# Patient Record
Sex: Female | Born: 1955
Health system: Southern US, Community
[De-identification: ages and names within clinical notes are randomized; demographics above are authoritative.]

## PROBLEM LIST (undated history)

## (undated) DIAGNOSIS — I10 Essential (primary) hypertension: Secondary | ICD-10-CM

## (undated) DIAGNOSIS — F411 Generalized anxiety disorder: Secondary | ICD-10-CM

## (undated) DIAGNOSIS — R7303 Prediabetes: Secondary | ICD-10-CM

## (undated) DIAGNOSIS — F329 Major depressive disorder, single episode, unspecified: Secondary | ICD-10-CM

## (undated) DIAGNOSIS — J302 Other seasonal allergic rhinitis: Secondary | ICD-10-CM

## (undated) DIAGNOSIS — F32A Depression, unspecified: Secondary | ICD-10-CM

## (undated) DIAGNOSIS — E785 Hyperlipidemia, unspecified: Secondary | ICD-10-CM

## (undated) DIAGNOSIS — F1010 Alcohol abuse, uncomplicated: Secondary | ICD-10-CM

## (undated) HISTORY — DX: Major depressive disorder, single episode, unspecified: F32.9

## (undated) HISTORY — DX: Essential (primary) hypertension: I10

## (undated) HISTORY — DX: Prediabetes: R73.03

## (undated) HISTORY — DX: Depression, unspecified: F32.A

## (undated) HISTORY — DX: Other seasonal allergic rhinitis: J30.2

## (undated) HISTORY — DX: Alcohol abuse, uncomplicated: F10.10

## (undated) HISTORY — DX: Generalized anxiety disorder: F41.1

## (undated) HISTORY — DX: Hyperlipidemia, unspecified: E78.5

---

## 1996-08-16 HISTORY — PX: ABDOMINAL HYSTERECTOMY: SHX81

## 2004-09-03 ENCOUNTER — Encounter: Admission: RE | Admit: 2004-09-03 | Discharge: 2004-09-03 | Payer: Self-pay | Admitting: Internal Medicine

## 2005-01-19 ENCOUNTER — Encounter: Admission: RE | Admit: 2005-01-19 | Discharge: 2005-01-19 | Payer: Self-pay | Admitting: Family Medicine

## 2005-11-16 ENCOUNTER — Encounter: Admission: RE | Admit: 2005-11-16 | Discharge: 2005-11-16 | Payer: Self-pay | Admitting: Family Medicine

## 2007-11-14 ENCOUNTER — Encounter: Admission: RE | Admit: 2007-11-14 | Discharge: 2007-11-14 | Payer: Self-pay | Admitting: Family Medicine

## 2008-12-09 ENCOUNTER — Encounter: Admission: RE | Admit: 2008-12-09 | Discharge: 2008-12-09 | Payer: Self-pay | Admitting: Family Medicine

## 2009-05-13 ENCOUNTER — Encounter (INDEPENDENT_AMBULATORY_CARE_PROVIDER_SITE_OTHER): Payer: Self-pay

## 2010-05-20 ENCOUNTER — Encounter: Admission: RE | Admit: 2010-05-20 | Discharge: 2010-05-20 | Payer: Self-pay | Admitting: Family Medicine

## 2010-09-06 ENCOUNTER — Encounter: Payer: Self-pay | Admitting: Family Medicine

## 2010-09-15 NOTE — Letter (Signed)
Summary: Pre Visit No Show Letter  [facility]  [address] [place], [place] [address]   Phone: [phone]  Fax: [phone]        May 13, 2009 MRN: [id number]    [facility] [address] [place], [place]  [address]    Dear Ms. Margraf,   We have been unable to reach you by phone concerning the pre-procedure visit that you missed on 05-13-2009. For this reason,your procedure scheduled on_10-03-2009 has been cancelled. Our scheduling staff will gladly assist you with rescheduling your appointments at a more convenient time. Please call our office at [phone] between the hours of 8:00am and 5:00pm, press option #2 to reach an appointment scheduler. Please consider updating your contact numbers at this time so that we can reach you by phone in the future with schedule changes or results.    Thank you,    Ulis Rias RN [facility]

## 2011-05-13 ENCOUNTER — Other Ambulatory Visit: Payer: Self-pay | Admitting: Family Medicine

## 2011-05-13 DIAGNOSIS — Z1231 Encounter for screening mammogram for malignant neoplasm of breast: Secondary | ICD-10-CM

## 2011-05-28 ENCOUNTER — Ambulatory Visit: Payer: Self-pay

## 2011-06-04 ENCOUNTER — Ambulatory Visit: Payer: Self-pay

## 2011-11-22 ENCOUNTER — Ambulatory Visit: Payer: Self-pay

## 2012-08-01 ENCOUNTER — Other Ambulatory Visit: Payer: Self-pay | Admitting: Family Medicine

## 2012-08-01 DIAGNOSIS — Z1231 Encounter for screening mammogram for malignant neoplasm of breast: Secondary | ICD-10-CM

## 2012-09-11 ENCOUNTER — Ambulatory Visit
Admission: RE | Admit: 2012-09-11 | Discharge: 2012-09-11 | Disposition: A | Discharge status: Home / Self Care | Payer: BC Managed Care – PPO | Source: Ambulatory Visit | Attending: Family Medicine | Admitting: Family Medicine

## 2012-09-11 DIAGNOSIS — Z1231 Encounter for screening mammogram for malignant neoplasm of breast: Secondary | ICD-10-CM

## 2013-10-29 ENCOUNTER — Other Ambulatory Visit: Payer: Self-pay

## 2013-10-29 DIAGNOSIS — Z1231 Encounter for screening mammogram for malignant neoplasm of breast: Secondary | ICD-10-CM

## 2013-12-06 ENCOUNTER — Ambulatory Visit
Admission: RE | Admit: 2013-12-06 | Discharge: 2013-12-06 | Disposition: A | Discharge status: Home / Self Care | Payer: BC Managed Care – PPO | Source: Ambulatory Visit

## 2013-12-06 DIAGNOSIS — Z1231 Encounter for screening mammogram for malignant neoplasm of breast: Secondary | ICD-10-CM

## 2013-12-10 ENCOUNTER — Other Ambulatory Visit: Payer: Self-pay | Admitting: Family Medicine

## 2013-12-10 DIAGNOSIS — R928 Other abnormal and inconclusive findings on diagnostic imaging of breast: Secondary | ICD-10-CM

## 2013-12-21 ENCOUNTER — Ambulatory Visit
Admission: RE | Admit: 2013-12-21 | Discharge: 2013-12-21 | Disposition: A | Discharge status: Home / Self Care | Payer: BC Managed Care – PPO | Source: Ambulatory Visit | Attending: Family Medicine | Admitting: Family Medicine

## 2013-12-21 ENCOUNTER — Other Ambulatory Visit: Payer: BC Managed Care – PPO

## 2013-12-21 DIAGNOSIS — R928 Other abnormal and inconclusive findings on diagnostic imaging of breast: Secondary | ICD-10-CM

## 2015-01-06 ENCOUNTER — Other Ambulatory Visit: Payer: Self-pay

## 2015-01-06 DIAGNOSIS — Z1231 Encounter for screening mammogram for malignant neoplasm of breast: Secondary | ICD-10-CM

## 2015-01-10 ENCOUNTER — Other Ambulatory Visit: Payer: Self-pay | Admitting: Family Medicine

## 2015-01-10 ENCOUNTER — Other Ambulatory Visit: Payer: Self-pay

## 2015-01-10 DIAGNOSIS — N6310 Unspecified lump in the right breast, unspecified quadrant: Secondary | ICD-10-CM

## 2015-01-14 ENCOUNTER — Ambulatory Visit: Payer: BC Managed Care – PPO

## 2015-01-16 ENCOUNTER — Other Ambulatory Visit: Payer: BC Managed Care – PPO

## 2015-01-21 ENCOUNTER — Ambulatory Visit
Admission: RE | Admit: 2015-01-21 | Discharge: 2015-01-21 | Disposition: A | Discharge status: Home / Self Care | Payer: BC Managed Care – PPO | Source: Ambulatory Visit | Attending: Family Medicine | Admitting: Family Medicine

## 2015-01-21 DIAGNOSIS — N6310 Unspecified lump in the right breast, unspecified quadrant: Secondary | ICD-10-CM

## 2016-01-28 ENCOUNTER — Other Ambulatory Visit: Payer: Self-pay | Admitting: Family Medicine

## 2016-01-28 DIAGNOSIS — Z1231 Encounter for screening mammogram for malignant neoplasm of breast: Secondary | ICD-10-CM

## 2016-02-06 ENCOUNTER — Ambulatory Visit
Admission: RE | Admit: 2016-02-06 | Discharge: 2016-02-06 | Disposition: A | Discharge status: Home / Self Care | Payer: BC Managed Care – PPO | Source: Ambulatory Visit | Attending: Family Medicine | Admitting: Family Medicine

## 2016-02-06 DIAGNOSIS — Z1231 Encounter for screening mammogram for malignant neoplasm of breast: Secondary | ICD-10-CM

## 2017-05-23 ENCOUNTER — Other Ambulatory Visit: Payer: Self-pay | Admitting: Family Medicine

## 2017-05-23 DIAGNOSIS — Z1231 Encounter for screening mammogram for malignant neoplasm of breast: Secondary | ICD-10-CM

## 2017-06-02 ENCOUNTER — Ambulatory Visit: Payer: BC Managed Care – PPO

## 2017-10-10 ENCOUNTER — Other Ambulatory Visit: Payer: Self-pay | Admitting: Family Medicine

## 2017-10-10 DIAGNOSIS — Z1231 Encounter for screening mammogram for malignant neoplasm of breast: Secondary | ICD-10-CM

## 2017-10-28 ENCOUNTER — Ambulatory Visit: Payer: BC Managed Care – PPO

## 2018-01-26 ENCOUNTER — Ambulatory Visit: Payer: BC Managed Care – PPO | Admitting: Adult Health

## 2018-03-01 ENCOUNTER — Ambulatory Visit: Payer: BC Managed Care – PPO | Admitting: Adult Health

## 2018-04-18 ENCOUNTER — Encounter: Payer: Self-pay | Admitting: Adult Health

## 2018-04-18 ENCOUNTER — Ambulatory Visit: Payer: BC Managed Care – PPO | Admitting: Adult Health

## 2018-04-18 VITALS — BP 156/100 | Temp 98.6°F | Wt 160.0 lb

## 2018-04-18 DIAGNOSIS — I10 Essential (primary) hypertension: Secondary | ICD-10-CM

## 2018-04-18 DIAGNOSIS — F329 Major depressive disorder, single episode, unspecified: Secondary | ICD-10-CM | POA: Insufficient documentation

## 2018-04-18 DIAGNOSIS — F3289 Other specified depressive episodes: Secondary | ICD-10-CM | POA: Diagnosis not present

## 2018-04-18 DIAGNOSIS — Z7689 Persons encountering health services in other specified circumstances: Secondary | ICD-10-CM | POA: Diagnosis not present

## 2018-04-18 DIAGNOSIS — E782 Mixed hyperlipidemia: Secondary | ICD-10-CM | POA: Diagnosis not present

## 2018-04-18 DIAGNOSIS — E785 Hyperlipidemia, unspecified: Secondary | ICD-10-CM | POA: Insufficient documentation

## 2018-04-18 DIAGNOSIS — F32A Depression, unspecified: Secondary | ICD-10-CM | POA: Insufficient documentation

## 2018-04-18 DIAGNOSIS — F411 Generalized anxiety disorder: Secondary | ICD-10-CM | POA: Insufficient documentation

## 2018-04-18 DIAGNOSIS — F1010 Alcohol abuse, uncomplicated: Secondary | ICD-10-CM

## 2018-04-18 NOTE — Patient Instructions (Addendum)
It was great meeting you today   Please sign up for Mychart and send me a list of all your medications and dosages.   We will request your records from [facility].   Please follow up with me in 1-2 months for your physical.

## 2018-04-18 NOTE — Progress Notes (Signed)
Patient presents to clinic today to establish care. She is a pleasant 62 year old female who  has a past medical history of Depression and Hypertension.   She was seeing primary care at [facility].   Her last CPE was " about a year ago"   Acute Concerns: Establish Care   Chronic Issues: Depression/Anxiety - Zoloft, Visteral PRN. Reports that she feels as though her medications are working well for her. Was prescribed them while she was in rehab.   Insomnia - Trazodone but unsure of dosage  Alcohol Abuse - Recently completed detox program in [place] for alcohol abuse. She is going to [facility].   Hypertension - Takes blood pressure medication, unsure of what she is taking.  BP Readings from Last 3 Encounters:  04/18/18 (!) 156/100   Hyperlipidemia - Takes statin, unsure which statin she is taking.   Seasonal Allergies - takes OTC   Health Maintenance: Dental -- Routine Care  Vision -- Routine Care - wears glasses.  Immunizations -- " up to date" Colonoscopy -- 2015 - believes she goes back every 10 years.  Mammogram -- " within in the last year" PAP -- No longer needed Bone Density -- Unknown  Diet: She has started to eat healthy.  Exercise: Works out at [employer] a week   Past Medical History:  Diagnosis Date  . Depression   . Hypertension       Current Outpatient Medications on File Prior to Visit  Medication Sig Dispense Refill  . benztropine (COGENTIN) 0.5 MG tablet     . sertraline (ZOLOFT) 50 MG tablet Take 50 mg by mouth daily.  0  . traZODone (DESYREL) 100 MG tablet      No current facility-administered medications on file prior to visit.     No Known Allergies  Family History  Problem Relation Age of Onset  . High Cholesterol Mother   . High blood pressure Mother   . Diabetes Mother   . High blood pressure Father   . High Cholesterol Father   . Diabetes Father   . High blood pressure Brother   . High Cholesterol Brother     Social History    Socioeconomic History  . Marital status: Single    Spouse name: Not on file  . Number of children: Not on file  . Years of education: Not on file  . Highest education level: Not on file  Occupational History  . Not on file  Social Needs  . Financial resource strain: Not on file  . Food insecurity:    Worry: Not on file    Inability: Not on file  . Transportation needs:    Medical: Not on file    Non-medical: Not on file  Tobacco Use  . Smoking status: Former Research scientist (life sciences)  . Smokeless tobacco: Never Used  Substance and Sexual Activity  . Alcohol use: Not Currently  . Drug use: Never  . Sexual activity: Not on file  Lifestyle  . Physical activity:    Days per week: Not on file    Minutes per session: Not on file  . Stress: Not on file  Relationships  . Social connections:    Talks on phone: Not on file    Gets together: Not on file    Attends religious service: Not on file    Active member of club or organization: Not on file    Attends meetings of clubs or organizations: Not on file  Relationship status: Not on file  . Intimate partner violence:    Fear of current or ex partner: Not on file    Emotionally abused: Not on file    Physically abused: Not on file    Forced sexual activity: Not on file  Other Topics Concern  . 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   HENT: Negative.   Respiratory: Negative.   Cardiovascular: Negative.   Gastrointestinal: Negative.   Genitourinary: Negative.   Musculoskeletal: Negative.   Skin: Negative.   Neurological: Negative.   Psychiatric/Behavioral: Positive for depression. The patient is nervous/anxious and has insomnia.   All other systems reviewed and are negative.       BP (!) 156/100   Temp 98.6 F (37 C) (Oral)   Wt 160 lb (72.6 kg)   Physical Exam  Constitutional: She is oriented to  person, place, and time. She appears well-developed and well-nourished. No distress.  Neck: Normal range of motion.  Cardiovascular: Normal rate, regular rhythm and intact distal pulses. Exam reveals no gallop and no friction rub.  No murmur heard. Pulmonary/Chest: Effort normal and breath sounds normal. No stridor. No respiratory distress. She has no wheezes. She has no rales. She exhibits no tenderness.  Abdominal: Soft. Bowel sounds are normal. She exhibits no distension and no mass. There is no tenderness. There is no rebound and no guarding. No hernia.  Neurological: She is alert and oriented to person, place, and time.  Skin: Skin is warm and dry. She is not diaphoretic.  Psychiatric: She has a normal mood and affect. Her behavior is normal. Judgment and thought content normal.  Nursing note and vitals reviewed.   Assessment/Plan: 1. Encounter to establish care - will request records from [facility].  - Follow up for CPE or sooner if needed - She will send me a list of all her medications via Mychart.   2. Essential hypertension - Not at goal. Will likely need to add medications   3. Mixed hyperlipidemia - Will wait for medication list   4. Other depression - Will wait for medication list   5. Alcohol abuse - Will wait for medication list    Dorothyann Peng, NP

## 2018-04-19 ENCOUNTER — Encounter: Payer: Self-pay | Admitting: Adult Health

## 2018-04-20 ENCOUNTER — Other Ambulatory Visit: Payer: Self-pay | Admitting: Adult Health

## 2018-04-20 MED ORDER — HYDROXYZINE PAMOATE 50 MG PO CAPS: 50.0000 mg | ORAL_CAPSULE | Freq: Three times a day (TID) | ORAL | 0 refills | Status: DC

## 2018-04-20 MED ORDER — HYDROCHLOROTHIAZIDE 25 MG PO TABS: 25.0000 mg | ORAL_TABLET | Freq: Every day | ORAL | 0 refills | Status: DC

## 2018-04-20 MED ORDER — ROSUVASTATIN CALCIUM 40 MG PO CPSP: 40.0000 mg | ORAL_CAPSULE | Freq: Every day | ORAL | 0 refills | Status: DC

## 2018-04-20 MED ORDER — BENZTROPINE MESYLATE 0.5 MG PO TABS: 0.5000 mg | ORAL_TABLET | Freq: Every day | ORAL | 0 refills | Status: DC

## 2018-04-20 MED ORDER — DISULFIRAM 500 MG PO TABS: 1.0000 | ORAL_TABLET | Freq: Every day | ORAL | 0 refills | Status: DC

## 2018-04-20 MED ORDER — QUINAPRIL HCL 20 MG PO TABS: 20.0000 mg | ORAL_TABLET | Freq: Every day | ORAL | 0 refills | Status: DC

## 2018-04-20 MED ORDER — SERTRALINE HCL 50 MG PO TABS: 50.0000 mg | ORAL_TABLET | Freq: Every day | ORAL | 0 refills | Status: DC

## 2018-04-20 MED ORDER — TRAZODONE HCL 100 MG PO TABS: 100.0000 mg | ORAL_TABLET | Freq: Every day | ORAL | 0 refills | Status: DC

## 2018-05-09 ENCOUNTER — Other Ambulatory Visit: Payer: Self-pay | Admitting: Adult Health

## 2018-05-09 MED ORDER — HYDROXYZINE PAMOATE 50 MG PO CAPS: 50.0000 mg | ORAL_CAPSULE | Freq: Three times a day (TID) | ORAL | 0 refills | Status: DC

## 2018-05-17 ENCOUNTER — Encounter: Payer: Self-pay | Admitting: Adult Health

## 2018-05-17 ENCOUNTER — Encounter: Payer: BC Managed Care – PPO | Admitting: Adult Health

## 2018-05-17 DIAGNOSIS — Z0289 Encounter for other administrative examinations: Secondary | ICD-10-CM

## 2018-05-17 NOTE — Progress Notes (Deleted)
Subjective:    Patient ID: Ellen [name], female    DOB: 1956-08-14, 62 y.o.   MRN: [id number]  HPI Patient presents for yearly preventative medicine examination. She is a pleasant 62 year old female who  has a past medical history of Alcohol abuse, Depression, Hyperlipidemia, Hypertension, and Seasonal allergies.  Essential hypertension-takes Accupril 20 mg tablets and hydrochlorothiazide 25 mg tablets  Hyperlipidemia -takes Crestor 40 mg capsules  Depression/Anxiety -controlled with Zoloft daily and Vistaril as needed  Insomnia -takes trazodone  Alcohol Abuse -recently completed rehab program in [place].  She is going to [facility] in [place]. She is also using Antabuse.  All immunizations and health maintenance protocols were reviewed with the patient and needed orders were placed.  Appropriate screening laboratory values were ordered for the patient including screening of hyperlipidemia, renal function and hepatic function.  Medication reconciliation,  past medical history, social history, problem list and allergies were reviewed in detail with the patient  Goals were established with regard to weight loss, exercise, and  diet in compliance with medications. She has been working on diet and is going to [employer] three times a week.  BP Readings from Last 3 Encounters:  04/18/18 (!) 156/100   End of life planning was discussed.  She reports having a colonoscopy within the last 5 years believe that it was normal(not have these records).  She also reports being up-to-date on her mammogram.  She participates in routine dental and vision screens  Review of Systems  Constitutional: Negative.   HENT: Negative.   Eyes: Negative.   Respiratory: Negative.   Cardiovascular: Negative.   Gastrointestinal: Negative.   Endocrine: Negative.   Genitourinary: Negative.   Musculoskeletal: Negative.   Skin: Negative.   Allergic/Immunologic: Negative.   Neurological: Negative.     Hematological: Negative.   Psychiatric/Behavioral: Negative.    Past Medical History:  Diagnosis Date  . Alcohol abuse   . Depression   . Hyperlipidemia   . Hypertension   . Seasonal allergies     Social History   Socioeconomic History  . Marital status: Married    Spouse name: Not on file  . Number of children: Not on file  . Years of education: Not on file  . Highest education level: Not on file  Occupational History  . Not on file  Social Needs  . Financial resource strain: Not on file  . Food insecurity:    Worry: Not on file    Inability: Not on file  . Transportation needs:    Medical: Not on file    Non-medical: Not on file  Tobacco Use  . Smoking status: Former Research scientist (life sciences)  . Smokeless tobacco: Never Used  Substance and Sexual Activity  . Alcohol use: Not Currently  . Drug use: Never  . Sexual activity: Not on file  Lifestyle  . Physical activity:    Days per week: Not on file    Minutes per session: Not on file  . Stress: Not on file  Relationships  . Social connections:    Talks on phone: Not on file    Gets together: Not on file    Attends religious service: Not on file    Active member of club or organization: Not on file    Attends meetings of clubs or organizations: Not on file    Relationship status: Not on file  . Intimate partner violence:    Fear of current or ex partner: Not on file  Emotionally abused: Not on file    Physically abused: Not on file    Forced sexual activity: Not on file  Other Topics Concern  . 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 Please review them in the "History" navigator section and refresh this Bradley.  Family History  Problem Relation Age of Onset  . High Cholesterol Mother   . High blood pressure Mother   . Diabetes Mother   . High blood pressure Father   . High Cholesterol Father    . Diabetes Father   . High blood pressure Brother   . High Cholesterol Brother     No Known Allergies  Current Outpatient Medications on File Prior to Visit  Medication Sig Dispense Refill  . benztropine (COGENTIN) 0.5 MG tablet Take 1 tablet (0.5 mg total) by mouth daily. 90 tablet 0  . Disulfiram 500 MG TABS Take 1 tablet (500 mg total) by mouth daily. 30 each 0  . hydrochlorothiazide (HYDRODIURIL) 25 MG tablet Take 1 tablet (25 mg total) by mouth daily. 90 tablet 0  . hydrOXYzine (VISTARIL) 50 MG capsule Take 1 capsule (50 mg total) by mouth 3 (three) times daily. 30 capsule 0  . quinapril (ACCUPRIL) 20 MG tablet Take 1 tablet (20 mg total) by mouth daily. 90 tablet 0  . Rosuvastatin Calcium 40 MG CPSP Take 40 mg by mouth daily. 90 capsule 0  . sertraline (ZOLOFT) 50 MG tablet Take 1 tablet (50 mg total) by mouth daily. 90 tablet 0  . traZODone (DESYREL) 100 MG tablet Take 1 tablet (100 mg total) by mouth at bedtime. 90 tablet 0   No current facility-administered medications on file prior to visit.     There were no vitals taken for this visit.      Objective:   Physical Exam  Constitutional: She is oriented to person, place, and time. She appears well-developed and well-nourished. No distress.  HENT:  Head: Normocephalic and atraumatic.  Right Ear: External ear normal.  Left Ear: External ear normal.  Nose: Nose normal.  Mouth/Throat: Oropharynx is clear and moist. No oropharyngeal exudate.  Eyes: Pupils are equal, round, and reactive to light. Conjunctivae and EOM are normal. Right eye exhibits no discharge. Left eye exhibits no discharge. No scleral icterus.  Neck: Normal range of motion. Neck supple. No JVD present. No tracheal deviation present. No thyromegaly present.  Cardiovascular: Normal rate, regular rhythm, normal heart sounds and intact distal pulses. Exam reveals no gallop and no friction rub.  No murmur heard. Pulmonary/Chest: Effort normal and breath sounds  normal. No respiratory distress. She has no wheezes. She has no rales. She exhibits no tenderness.  Abdominal: Soft. Bowel sounds are normal. She exhibits no distension and no mass. There is no tenderness. There is no rebound and no guarding. No hernia.  Musculoskeletal: Normal range of motion. She exhibits no edema, tenderness or deformity.  Lymphadenopathy:    She has no cervical adenopathy.  Neurological: She is alert and oriented to person, place, and time. She displays normal reflexes. No cranial nerve deficit or sensory deficit. She exhibits normal muscle tone. Coordination normal.  Skin: Skin is warm and dry. Capillary refill takes less than 2 seconds. No rash noted. She is not diaphoretic. No erythema. No pallor.  Psychiatric: She has a normal mood and  affect. Her behavior is normal. Judgment and thought content normal.  Nursing note and vitals reviewed.     Assessment & Plan:

## 2018-06-15 ENCOUNTER — Other Ambulatory Visit: Payer: Self-pay | Admitting: Adult Health

## 2018-06-16 MED ORDER — DISULFIRAM 500 MG PO TABS: 1.0000 | ORAL_TABLET | Freq: Every day | ORAL | 1 refills | Status: AC

## 2018-06-16 MED ORDER — HYDROXYZINE PAMOATE 50 MG PO CAPS: 50.0000 mg | ORAL_CAPSULE | Freq: Three times a day (TID) | ORAL | 1 refills | Status: DC

## 2018-06-16 NOTE — Telephone Encounter (Signed)
Benztropine, trazodone, sertraline, quinapril, hctz and rosuvastatin were all filled on 04/20/18 for 90 days.  Will send remaining to [facility] for authorization.

## 2018-06-20 NOTE — Telephone Encounter (Signed)
Denied.  This has been responded to through another refill request.

## 2018-06-21 ENCOUNTER — Ambulatory Visit (INDEPENDENT_AMBULATORY_CARE_PROVIDER_SITE_OTHER): Payer: BC Managed Care – PPO | Admitting: Adult Health

## 2018-06-21 ENCOUNTER — Encounter: Payer: Self-pay | Admitting: Adult Health

## 2018-06-21 VITALS — BP 110/74 | HR 74 | Temp 98.0°F | Ht 66.75 in | Wt 170.2 lb

## 2018-06-21 DIAGNOSIS — I10 Essential (primary) hypertension: Secondary | ICD-10-CM | POA: Diagnosis not present

## 2018-06-21 DIAGNOSIS — F3289 Other specified depressive episodes: Secondary | ICD-10-CM

## 2018-06-21 DIAGNOSIS — Z0000 Encounter for general adult medical examination without abnormal findings: Secondary | ICD-10-CM

## 2018-06-21 DIAGNOSIS — Z23 Encounter for immunization: Secondary | ICD-10-CM

## 2018-06-21 DIAGNOSIS — E782 Mixed hyperlipidemia: Secondary | ICD-10-CM | POA: Diagnosis not present

## 2018-06-21 DIAGNOSIS — Z0001 Encounter for general adult medical examination with abnormal findings: Secondary | ICD-10-CM

## 2018-06-21 DIAGNOSIS — F1010 Alcohol abuse, uncomplicated: Secondary | ICD-10-CM

## 2018-06-21 DIAGNOSIS — R251 Tremor, unspecified: Secondary | ICD-10-CM

## 2018-06-21 LAB — HEPATIC FUNCTION PANEL
ALT: 19 U/L (ref 0–35)
AST: 21 U/L (ref 0–37)
Albumin: 4.1 g/dL (ref 3.5–5.2)
Alkaline Phosphatase: 83 U/L (ref 39–117)
Bilirubin, Direct: 0.1 mg/dL (ref 0.0–0.3)
Total Bilirubin: 0.3 mg/dL (ref 0.2–1.2)
Total Protein: 6.8 g/dL (ref 6.0–8.3)

## 2018-06-21 LAB — CBC WITH DIFFERENTIAL/PLATELET
Basophils Absolute: 0 10*3/uL (ref 0.0–0.1)
Basophils Relative: 0.5 % (ref 0.0–3.0)
Eosinophils Absolute: 0.1 10*3/uL (ref 0.0–0.7)
Eosinophils Relative: 2.1 % (ref 0.0–5.0)
HCT: 36.2 % (ref 36.0–46.0)
Hemoglobin: 12.3 g/dL (ref 12.0–15.0)
Lymphocytes Relative: 39.5 % (ref 12.0–46.0)
Lymphs Abs: 2 10*3/uL (ref 0.7–4.0)
MCHC: 33.9 g/dL (ref 30.0–36.0)
MCV: 90.8 fl (ref 78.0–100.0)
Monocytes Absolute: 0.4 10*3/uL (ref 0.1–1.0)
Monocytes Relative: 8.3 % (ref 3.0–12.0)
Neutro Abs: 2.5 10*3/uL (ref 1.4–7.7)
Neutrophils Relative %: 49.6 % (ref 43.0–77.0)
Platelets: 211 10*3/uL (ref 150.0–400.0)
RBC: 3.99 Mil/uL (ref 3.87–5.11)
RDW: 13.8 % (ref 11.5–15.5)
WBC: 5.1 10*3/uL (ref 4.0–10.5)

## 2018-06-21 LAB — BASIC METABOLIC PANEL
BUN: 11 mg/dL (ref 6–23)
CO2: 31 mEq/L (ref 19–32)
Calcium: 9.5 mg/dL (ref 8.4–10.5)
Chloride: 101 mEq/L (ref 96–112)
Creatinine, Ser: 0.92 mg/dL (ref 0.40–1.20)
GFR: 65.62 mL/min (ref >60.00)
Glucose, Bld: 78 mg/dL (ref 70–99)
Potassium: 3.6 mEq/L (ref 3.5–5.1)
Sodium: 140 mEq/L (ref 135–145)

## 2018-06-21 LAB — LIPID PANEL
Cholesterol: 173 mg/dL (ref 0–200)
HDL: 52 mg/dL (ref >39.00)
LDL Cholesterol: 90 mg/dL (ref 0–99)
NonHDL: 120.65
Total CHOL/HDL Ratio: 3
Triglycerides: 151 mg/dL — ABNORMAL HIGH (ref 0.0–149.0)
VLDL: 30.2 mg/dL (ref 0.0–40.0)

## 2018-06-21 LAB — TSH: TSH: 0.46 u[IU]/mL (ref 0.35–4.50)

## 2018-06-21 LAB — HEMOGLOBIN A1C: Hgb A1c MFr Bld: 6.3 % (ref 4.6–6.5)

## 2018-06-21 MED ORDER — BENZTROPINE MESYLATE 1 MG PO TABS: 1.0000 mg | ORAL_TABLET | Freq: Every day | ORAL | 1 refills | Status: DC

## 2018-06-21 NOTE — Patient Instructions (Signed)
It was great seeing you today and I cannot express how proud I am of you! Keep up the good work  We will follow up with you regarding your blood work   I have increased Cogentin to 1 mg daily. Please let me know if this does not help

## 2018-06-21 NOTE — Addendum Note (Signed)
Addended by: Agnes Lawrence on: 06/21/2018 01:51 PM   Modules accepted: Orders

## 2018-06-21 NOTE — Progress Notes (Signed)
Subjective:    Patient ID: Briannia Laba, female    DOB: [birth year], 62 y.o.   MRN: [id number]  HPI Patient presents for yearly preventative medicine examination. She is a pleasant 62 year old female who  has a past medical history of Alcohol abuse, Depression, Hyperlipidemia, Hypertension, and Seasonal allergies.   Alcohol Abuse -completed detox program in [place] over the summer.  She is currently going to [facility] and is taking Antabuse. She is doing well with her sobriety and does not report any slip ups. She has been sober since February 03, 2018   Depression/Anxiety -only prescribed Zoloft daily and Vistaril as needed.  She continues to feel as though these medications are working well for her.  Insomnia - Takes Trazodone QHS. Repots that she sleeps well.   Essential Hypertension - currently prescribed Accupril 20 mg daily and HCTZ 25 mg daily. BP well controlled.  BP Readings from Last 3 Encounters:  06/21/18 110/74  04/18/18 (!) 156/100   Hyperlipidemia - Takes Crestor 40 mg   Cogentin - takes 0.5 mg for tremors. She feels as though her tremors are still bothersome, especially when she is typing at work   All immunizations and health maintenance protocols were reviewed with the patient and needed orders were placed. Due for flu shot.   Appropriate screening laboratory values were ordered for the patient including screening of hyperlipidemia, renal function and hepatic function.  Medication reconciliation,  past medical history, social history, problem list and allergies were reviewed in detail with the patient  Goals were established with regard to weight loss, exercise, and  diet in compliance with medications. She continues to work out at the gym three days a week   Wt Readings from Last 3 Encounters:  06/21/18 170 lb 3.2 oz (77.2 kg)  04/18/18 160 lb (72.6 kg)   She reports being up to date on her colonoscopy, mammogram, dental and vision screens ( I do not have  these records)    Review of Systems  Constitutional: Negative.   HENT: Negative.   Eyes: Negative.   Respiratory: Negative.   Cardiovascular: Negative.   Gastrointestinal: Negative.   Endocrine: Negative.   Genitourinary: Negative.   Musculoskeletal: Negative.   Skin: Negative.   Allergic/Immunologic: Negative.   Neurological: Positive for tremors.  Hematological: Negative.   Psychiatric/Behavioral: Negative.    Past Medical History:  Diagnosis Date  . Alcohol abuse   . Depression   . Hyperlipidemia   . Hypertension   . Seasonal allergies     Social History   Socioeconomic History  . Marital status: Married    Spouse name: Not on file  . Number of children: Not on file  . Years of education: Not on file  . Highest education level: Not on file  Occupational History  . Not on file  Social Needs  . Financial resource strain: Not on file  . Food insecurity:    Worry: Not on file    Inability: Not on file  . Transportation needs:    Medical: Not on file    Non-medical: Not on file  Tobacco Use  . Smoking status: Former Research scientist (life sciences)  . Smokeless tobacco: Never Used  Substance and Sexual Activity  . Alcohol use: Not Currently  . Drug use: Never  . Sexual activity: Not on file  Lifestyle  . Physical activity:    Days per week: Not on file    Minutes per session: Not on file  . Stress: Not on  file  Relationships  . Social connections:    Talks on phone: Not on file    Gets together: Not on file    Attends religious service: Not on file    Active member of club or organization: Not on file    Attends meetings of clubs or organizations: Not on file    Relationship status: Not on file  . Intimate partner violence:    Fear of current or ex partner: Not on file    Emotionally abused: Not on file    Physically abused: Not on file    Forced sexual activity: Not on file  Other Topics Concern  . 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 ABDOMINAL HYSTERECTOMY  1998    Family History  Problem Relation Age of Onset  . High Cholesterol Mother   . High blood pressure Mother   . Diabetes Mother   . High blood pressure Father   . High Cholesterol Father   . Diabetes Father   . High blood pressure Brother   . High Cholesterol Brother     No Known Allergies  Current Outpatient Medications on File Prior to Visit  Medication Sig Dispense Refill  . benztropine (COGENTIN) 0.5 MG tablet Take 1 tablet (0.5 mg total) by mouth daily. 90 tablet 0  . Disulfiram 500 MG TABS Take 1 tablet (500 mg total) by mouth daily. 90 tablet 1  . hydrochlorothiazide (HYDRODIURIL) 25 MG tablet Take 1 tablet (25 mg total) by mouth daily. 90 tablet 0  . hydrOXYzine (VISTARIL) 50 MG capsule Take 1 capsule (50 mg total) by mouth 3 (three) times daily. 270 capsule 1  . quinapril (ACCUPRIL) 20 MG tablet Take 1 tablet (20 mg total) by mouth daily. 90 tablet 0  . Rosuvastatin Calcium 40 MG CPSP Take 40 mg by mouth daily. 90 capsule 0  . sertraline (ZOLOFT) 50 MG tablet Take 1 tablet (50 mg total) by mouth daily. 90 tablet 0  . traZODone (DESYREL) 100 MG tablet Take 1 tablet (100 mg total) by mouth at bedtime. 90 tablet 0   No current facility-administered medications on file prior to visit.     BP 110/74 (BP Location: Left Arm, Patient Position: Sitting, Cuff Size: Large)   Pulse 74   Temp 98 F (36.7 C) (Oral)   Ht 5' 6.75" (1.695 m)   Wt 170 lb 3.2 oz (77.2 kg)   BMI 26.86 kg/m       Objective:   Physical Exam  Constitutional: She is oriented to person, place, and time. She appears well-developed and well-nourished. No distress.  HENT:  Head: Normocephalic and atraumatic.  Right Ear: External ear normal.  Left Ear: External ear normal.  Nose: Nose normal.  Mouth/Throat: Oropharynx is clear and moist. No oropharyngeal exudate.  Eyes:  Pupils are equal, round, and reactive to light. Conjunctivae and EOM are normal. Right eye exhibits no discharge. Left eye exhibits no discharge. No scleral icterus.  Neck: Normal range of motion. Neck supple. No JVD present. No tracheal deviation present. No thyromegaly present.  Cardiovascular: Normal rate, regular rhythm, normal heart sounds and intact distal pulses. Exam reveals no gallop and no friction rub.  No murmur heard. Pulmonary/Chest: Effort normal and breath sounds normal. No stridor. No respiratory distress. She has no wheezes.  She has no rales. She exhibits no tenderness.  Abdominal: Soft. Bowel sounds are normal. She exhibits no distension and no mass. There is no tenderness. There is no rebound and no guarding. No hernia.  Genitourinary:  Genitourinary Comments: Refused breast exam   Musculoskeletal: Normal range of motion. She exhibits no edema, tenderness or deformity.  Lymphadenopathy:    She has no cervical adenopathy.  Neurological: She is alert and oriented to person, place, and time. She displays tremor (bilateral upper extremities ). She displays normal reflexes. No cranial nerve deficit or sensory deficit. She exhibits normal muscle tone. Coordination and gait normal.  Skin: Skin is warm and dry. Capillary refill takes less than 2 seconds. No rash noted. She is not diaphoretic. No erythema. No pallor.  Psychiatric: She has a normal mood and affect. Her behavior is normal. Judgment and thought content normal.  Nursing note and vitals reviewed.     Assessment & Plan:  1. Routine general medical examination at a health care facility  - Basic metabolic panel - CBC with Differential/Platelet - Hepatic function panel - Lipid panel - TSH - Hemoglobin A1c  2. Alcohol abuse - Doing well with current treatment  - Congratulated on being sober since June 2019   3. Other depression - Continue   4. Mixed hyperlipidemia - Consider increase in statin  - Basic metabolic  panel - CBC with Differential/Platelet - Hepatic function panel - Lipid panel - TSH - Hemoglobin A1c  5. Essential hypertension - Well controlled with no improvement  - Basic metabolic panel - CBC with Differential/Platelet - Hepatic function panel - Lipid panel - TSH - Hemoglobin A1c  6. Tremors of nervous system - Will increase Cogentin to 1 mg - benztropine (COGENTIN) 1 MG tablet; Take 1 tablet (1 mg total) by mouth daily.  Dispense: 90 tablet; Refill: 1   Dorothyann Peng, NP

## 2018-06-23 ENCOUNTER — Telehealth: Payer: Self-pay | Admitting: *Deleted

## 2018-06-23 NOTE — Telephone Encounter (Signed)
Left message on machine for patient to return our call CRM 

## 2018-06-23 NOTE — Telephone Encounter (Signed)
I can either send in Camprel or she can try another pharmacy for the Antabuse

## 2018-06-23 NOTE — Telephone Encounter (Signed)
Disulfiram 500 MG TABS Is not available please advise

## 2018-06-26 ENCOUNTER — Other Ambulatory Visit: Payer: Self-pay | Admitting: Adult Health

## 2018-06-27 ENCOUNTER — Encounter: Payer: Self-pay | Admitting: Adult Health

## 2018-06-27 MED ORDER — HYDROCHLOROTHIAZIDE 25 MG PO TABS: 25.0000 mg | ORAL_TABLET | Freq: Every day | ORAL | 3 refills | Status: DC

## 2018-06-27 MED ORDER — TRAZODONE HCL 100 MG PO TABS: 100.0000 mg | ORAL_TABLET | Freq: Every day | ORAL | 1 refills | Status: DC

## 2018-06-27 MED ORDER — SERTRALINE HCL 50 MG PO TABS: 50.0000 mg | ORAL_TABLET | Freq: Every day | ORAL | 1 refills | Status: DC

## 2018-06-27 MED ORDER — ROSUVASTATIN CALCIUM 40 MG PO CPSP: 40.0000 mg | ORAL_CAPSULE | Freq: Every day | ORAL | 3 refills | Status: DC

## 2018-06-27 MED ORDER — QUINAPRIL HCL 20 MG PO TABS: 20.0000 mg | ORAL_TABLET | Freq: Every day | ORAL | 3 refills | Status: DC

## 2018-06-27 NOTE — Telephone Encounter (Signed)
Ok to refill Zoloft and Trazodone of 6 months, all the rest for a year

## 2018-06-27 NOTE — Telephone Encounter (Signed)
Cory, all medication was filled on 04/20/18 for 3 months.  Pharmacy most likely trying to get refills on file for the pt.  Please advise.

## 2018-06-27 NOTE — Telephone Encounter (Signed)
Sent to the pharmacy by e-scribe as directed. 

## 2018-07-11 ENCOUNTER — Other Ambulatory Visit: Payer: Self-pay | Admitting: Adult Health

## 2018-07-12 NOTE — Telephone Encounter (Signed)
DENIED.  FILLED ON 06/21/18 FOR 6 MONTHS. REQUEST IS EARLY.

## 2018-09-15 ENCOUNTER — Other Ambulatory Visit: Payer: Self-pay | Admitting: Adult Health

## 2018-09-27 ENCOUNTER — Other Ambulatory Visit: Payer: Self-pay | Admitting: Adult Health

## 2018-09-28 MED ORDER — SERTRALINE HCL 50 MG PO TABS: 50.0000 mg | ORAL_TABLET | Freq: Every day | ORAL | 1 refills | Status: DC

## 2018-09-28 MED ORDER — TRAZODONE HCL 100 MG PO TABS: 100.0000 mg | ORAL_TABLET | Freq: Every day | ORAL | 1 refills | Status: DC

## 2018-09-28 NOTE — Telephone Encounter (Signed)
Spoke to the pharmacy and they had no refills on file.  Sent by e-scribe. See other encounter.

## 2018-09-28 NOTE — Telephone Encounter (Signed)
Sent to the pharmacy by e-scribe. 

## 2018-09-28 NOTE — Telephone Encounter (Signed)
Filled for 6 months in Nov.  Will call pharmacy when open.

## 2018-12-11 ENCOUNTER — Encounter: Payer: Self-pay | Admitting: Adult Health

## 2018-12-18 ENCOUNTER — Other Ambulatory Visit: Payer: Self-pay | Admitting: Adult Health

## 2018-12-18 DIAGNOSIS — R251 Tremor, unspecified: Secondary | ICD-10-CM

## 2018-12-18 NOTE — Telephone Encounter (Signed)
SENT TO THE PHARMACY BY E-SCRIBE. 

## 2019-01-31 ENCOUNTER — Other Ambulatory Visit: Payer: Self-pay | Admitting: Adult Health

## 2019-02-01 MED ORDER — TRAZODONE HCL 100 MG PO TABS: 100.0000 mg | ORAL_TABLET | Freq: Every day | ORAL | 1 refills | Status: DC

## 2019-02-01 MED ORDER — SERTRALINE HCL 50 MG PO TABS: 50.0000 mg | ORAL_TABLET | Freq: Every day | ORAL | 1 refills | Status: DC

## 2019-02-08 ENCOUNTER — Encounter: Payer: Self-pay | Admitting: Adult Health

## 2019-02-09 ENCOUNTER — Ambulatory Visit (INDEPENDENT_AMBULATORY_CARE_PROVIDER_SITE_OTHER): Payer: BC Managed Care – PPO | Admitting: Adult Health

## 2019-02-09 ENCOUNTER — Encounter: Payer: Self-pay | Admitting: Adult Health

## 2019-02-09 ENCOUNTER — Other Ambulatory Visit: Payer: Self-pay

## 2019-02-09 DIAGNOSIS — G4700 Insomnia, unspecified: Secondary | ICD-10-CM

## 2019-02-09 MED ORDER — TRAZODONE HCL 100 MG PO TABS: 200.0000 mg | ORAL_TABLET | Freq: Every day | ORAL | 0 refills | Status: DC

## 2019-02-09 NOTE — Progress Notes (Signed)
Virtual Visit via Video Note  I connected with Ellen [name]  on 02/09/19 at  2:00 PM EDT by a video enabled telemedicine application and verified that I am speaking with the correct person using two identifiers.  Location patient: home Location provider:work or home office Persons participating in the virtual visit: patient, provider  I discussed the limitations of evaluation and management by telemedicine and the availability of in person appointments. The patient expressed understanding and agreed to proceed.   HPI: 63 year old female who is being evaluated today for chronic concern of insomnia.  Currently she is prescribed trazodone 100 mg tablets.  She reports that over the last few months she has had more trouble sleeping and has had to take 200 mg tablets.  Unfortunately she is running out of her medication earlier.  Feels as though she gets a more restful sleep with the 200 mg tablets that she has been taking 100 mg for extended time.  She is wondering can safely go up on this medication.   ROS: See pertinent positives and negatives per HPI.  Past Medical History:  Diagnosis Date  . Alcohol abuse   . Depression   . Hyperlipidemia   . Hypertension   . Seasonal allergies     Past Surgical History:  Procedure Laterality Date  . ABDOMINAL HYSTERECTOMY  1998    Family History  Problem Relation Age of Onset  . High Cholesterol Mother   . High blood pressure Mother   . Diabetes Mother   . High blood pressure Father   . High Cholesterol Father   . Diabetes Father   . High blood pressure Brother   . High Cholesterol Brother      Current Outpatient Medications:  .  benztropine (COGENTIN) 1 MG tablet, TAKE 1 TABLET BY MOUTH EVERY DAY, Disp: 90 tablet, Rfl: 1 .  hydrochlorothiazide (HYDRODIURIL) 25 MG tablet, Take 1 tablet (25 mg total) by mouth daily., Disp: 90 tablet, Rfl: 3 .  quinapril (ACCUPRIL) 20 MG tablet, Take 1 tablet (20 mg total) by mouth daily., Disp: 90 tablet,  Rfl: 3 .  Rosuvastatin Calcium 40 MG CPSP, Take 40 mg by mouth daily., Disp: 90 capsule, Rfl: 3 .  sertraline (ZOLOFT) 50 MG tablet, Take 1 tablet (50 mg total) by mouth daily., Disp: 90 tablet, Rfl: 1 .  traZODone (DESYREL) 100 MG tablet, Take 2 tablets (200 mg total) by mouth at bedtime., Disp: 180 tablet, Rfl: 0  EXAM:  VITALS per patient if applicable:  GENERAL: alert, oriented, appears well and in no acute distress  HEENT: atraumatic, conjunttiva clear, no obvious abnormalities on inspection of external nose and ears  NECK: normal movements of the head and neck  LUNGS: on inspection no signs of respiratory distress, breathing rate appears normal, no obvious gross SOB, gasping or wheezing  CV: no obvious cyanosis  MS: moves all visible extremities without noticeable abnormality  PSYCH/NEURO: pleasant and cooperative, no obvious depression or anxiety, speech and thought processing grossly intact  ASSESSMENT AND PLAN:  Discussed the following assessment and plan:  1. Insomnia, unspecified type - I am ok with her increasing Trazodone to 200 mg QHS but this is the maximum dose for this medication.  - traZODone (DESYREL) 100 MG tablet; Take 2 tablets (200 mg total) by mouth at bedtime.  Dispense: 180 tablet; Refill: 0   I discussed the assessment and treatment plan with the patient. The patient was provided an opportunity to ask questions and all were answered. The patient  agreed with the plan and demonstrated an understanding of the instructions.   The patient was advised to call back or seek an in-person evaluation if the symptoms worsen or if the condition fails to improve as anticipated.   Dorothyann Peng, NP

## 2019-02-14 ENCOUNTER — Ambulatory Visit: Payer: BC Managed Care – PPO | Admitting: Adult Health

## 2019-02-15 ENCOUNTER — Encounter: Payer: Self-pay | Admitting: Adult Health

## 2019-02-15 ENCOUNTER — Other Ambulatory Visit: Payer: Self-pay

## 2019-02-15 ENCOUNTER — Ambulatory Visit: Payer: BC Managed Care – PPO | Admitting: Adult Health

## 2019-02-15 VITALS — BP 108/80 | Temp 98.1°F | Wt 183.0 lb

## 2019-02-15 DIAGNOSIS — Z1231 Encounter for screening mammogram for malignant neoplasm of breast: Secondary | ICD-10-CM

## 2019-02-15 DIAGNOSIS — G479 Sleep disorder, unspecified: Secondary | ICD-10-CM | POA: Diagnosis not present

## 2019-02-15 DIAGNOSIS — R4181 Age-related cognitive decline: Secondary | ICD-10-CM | POA: Diagnosis not present

## 2019-02-15 LAB — VITAMIN D 25 HYDROXY (VIT D DEFICIENCY, FRACTURES): VITD: 21.62 ng/mL — ABNORMAL LOW (ref 30.00–100.00)

## 2019-02-15 LAB — BASIC METABOLIC PANEL
BUN: 12 mg/dL (ref 6–23)
CO2: 31 mEq/L (ref 19–32)
Calcium: 9.5 mg/dL (ref 8.4–10.5)
Chloride: 103 mEq/L (ref 96–112)
Creatinine, Ser: 1.05 mg/dL (ref 0.40–1.20)
GFR: 52.9 mL/min — ABNORMAL LOW (ref >60.00)
Glucose, Bld: 81 mg/dL (ref 70–99)
Potassium: 4.2 mEq/L (ref 3.5–5.1)
Sodium: 141 mEq/L (ref 135–145)

## 2019-02-15 LAB — VITAMIN B12: Vitamin B-12: 422 pg/mL (ref 211–911)

## 2019-02-15 LAB — TSH: TSH: 0.16 u[IU]/mL — ABNORMAL LOW (ref 0.35–4.50)

## 2019-02-15 NOTE — Progress Notes (Signed)
Subjective:    Patient ID: Ellen [name], female    DOB: 1955-10-28, 63 y.o.   MRN: [id number]  HPI 63 year old female who  has a past medical history of Alcohol abuse, Depression, Hyperlipidemia, Hypertension, and Seasonal allergies.  She presents to the office today for concern of cognitive impairment. She reports that her symptoms have been present since around September 2019. Her symptoms are intermittent but have become more frequent. Her symptoms include that of word finding issues " I will be in the middle of a conversation and forget a word or thought".   She has had some instanced of getting lost while running errands. The last time this happened was about a month ago when she was dropping her granddaughter of at her daughters house. It was dark and after she dropped her grand daughter off, it took er about 2 hours to get home ( drive 10 miles). She feels as though she has to rely on GPS more often. She is concerned because her father died from dementia and she has started to notice some of the same signs and symptoms that he had   She does misplace her pocketbook and car keys but this has been going on for years.   She does have chronic insomnia and has to take 200 mg Trazodone to fall asleep. Some nights she is able to fall asleep easier than others. She does report snoring but does not feel as though she wakes up gasping for breath. Some mornings she does feel fatigued.    Review of Systems See HPI   Past Medical History:  Diagnosis Date  . Alcohol abuse   . Depression   . Hyperlipidemia   . Hypertension   . Seasonal allergies     Social History   Socioeconomic History  . Marital status: Married    Spouse name: Not on file  . Number of children: Not on file  . Years of education: Not on file  . Highest education level: Not on file  Occupational History  . Not on file  Social Needs  . Financial resource strain: Not on file  . Food insecurity    Worry: Not  on file    Inability: Not on file  . Transportation needs    Medical: Not on file    Non-medical: Not on file  Tobacco Use  . Smoking status: Former Research scientist (life sciences)  . Smokeless tobacco: Never Used  Substance and Sexual Activity  . Alcohol use: Not Currently  . Drug use: Never  . Sexual activity: Not on file  Lifestyle  . Physical activity    Days per week: Not on file    Minutes per session: Not on file  . Stress: Not on file  Relationships  . Social Herbalist on phone: Not on file    Gets together: Not on file    Attends religious service: Not on file    Active member of club or organization: Not on file    Attends meetings of clubs or organizations: Not on file    Relationship status: Not on file  . Intimate partner violence    Fear of current or ex partner: Not on file    Emotionally abused: Not on file    Physically abused: Not on file    Forced sexual activity: Not on file  Other Topics Concern  . 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 ABDOMINAL HYSTERECTOMY  1998    Family History  Problem Relation Age of Onset  . High Cholesterol Mother   . High blood pressure Mother   . Diabetes Mother   . High blood pressure Father   . High Cholesterol Father   . Diabetes Father   . High blood pressure Brother   . High Cholesterol Brother     No Known Allergies  Current Outpatient Medications on File Prior to Visit  Medication Sig Dispense Refill  . hydrochlorothiazide (HYDRODIURIL) 25 MG tablet Take 1 tablet (25 mg total) by mouth daily. 90 tablet 3  . sertraline (ZOLOFT) 50 MG tablet Take 1 tablet (50 mg total) by mouth daily. 90 tablet 1  . traZODone (DESYREL) 100 MG tablet Take 2 tablets (200 mg total) by mouth at bedtime. 180 tablet 0  . quinapril (ACCUPRIL) 20 MG tablet Take 1 tablet (20 mg total) by mouth daily. 90 tablet  3  . Rosuvastatin Calcium 40 MG CPSP Take 40 mg by mouth daily. 90 capsule 3   No current facility-administered medications on file prior to visit.     BP 108/80   Temp 98.1 F (36.7 C)   Wt 183 lb (83 kg)   BMI 28.88 kg/m       Objective:   Physical Exam Vitals signs and nursing note reviewed.  Constitutional:      Appearance: Normal appearance.  Eyes:     Extraocular Movements: Extraocular movements intact.     Pupils: Pupils are equal, round, and reactive to light.  Cardiovascular:     Rate and Rhythm: Normal rate and regular rhythm.     Pulses: Normal pulses.     Heart sounds: Normal heart sounds.  Pulmonary:     Effort: Pulmonary effort is normal.     Breath sounds: Normal breath sounds.  Musculoskeletal: Normal range of motion.  Skin:    General: Skin is warm and dry.  Neurological:     General: No focal deficit present.     Mental Status: She is alert and oriented to person, place, and time.     Cranial Nerves: Cranial nerves are intact.     Sensory: Sensation is intact.     Motor: Motor function is intact.     Coordination: Coordination is intact.     Gait: Gait is intact.  Psychiatric:        Mood and Affect: Mood normal.        Behavior: Behavior normal.        Thought Content: Thought content normal.        Judgment: Judgment normal.       Assessment & Plan:  1. Age-related cognitive decline - Discussed possible etiologies. Possibly due to amount of Trazodone and sleep apnea. Will check labs and she would like to have an MRI  - MMSE 29/30.  - TSH - Vitamin B12 - Vitamin D, 28-NOMVEHM - Basic Metabolic Panel - MR Brain Wo Contrast; Future  2. Screening mammogram, encounter for  - MM DIGITAL SCREENING BILATERAL; Future  3. Sleep disturbance  - Ambulatory referral to Pulmonology   Dorothyann Peng, NP

## 2019-02-20 ENCOUNTER — Other Ambulatory Visit: Payer: Self-pay | Admitting: Adult Health

## 2019-02-20 DIAGNOSIS — E0590 Thyrotoxicosis, unspecified without thyrotoxic crisis or storm: Secondary | ICD-10-CM

## 2019-03-08 ENCOUNTER — Other Ambulatory Visit (INDEPENDENT_AMBULATORY_CARE_PROVIDER_SITE_OTHER): Payer: BC Managed Care – PPO

## 2019-03-08 ENCOUNTER — Other Ambulatory Visit: Payer: Self-pay

## 2019-03-08 DIAGNOSIS — E0590 Thyrotoxicosis, unspecified without thyrotoxic crisis or storm: Secondary | ICD-10-CM

## 2019-03-08 LAB — TSH: TSH: 0.41 u[IU]/mL (ref 0.35–4.50)

## 2019-03-08 LAB — T3, FREE: T3, Free: 3.2 pg/mL (ref 2.3–4.2)

## 2019-03-08 LAB — T4, FREE: Free T4: 0.92 ng/dL (ref 0.60–1.60)

## 2019-03-27 ENCOUNTER — Other Ambulatory Visit: Payer: BC Managed Care – PPO

## 2019-03-29 ENCOUNTER — Telehealth: Payer: Self-pay | Admitting: Adult Health

## 2019-03-29 NOTE — Telephone Encounter (Signed)
Authorization has been complete - Order ID: [id number] Request Status:  [place]: [place]   Valid Dates: 03/29/2019 - 09/24/2019 Scheduled Date of Service: 03/29/2019  Copied from [facility] #[id number]. Topic: General - Other >> Mar 26, 2019  9:32 AM Rainey Pines A wrote: Delana Meyer from [facility] needs a callback in regards to patient needing PA from MRI of the brain scheduled for tomorrow [phone]

## 2019-04-15 ENCOUNTER — Other Ambulatory Visit: Payer: Self-pay | Admitting: Adult Health

## 2019-04-15 DIAGNOSIS — G4700 Insomnia, unspecified: Secondary | ICD-10-CM

## 2019-04-17 NOTE — Telephone Encounter (Signed)
Sent to the pharmacy by e-scribe. 

## 2019-04-25 ENCOUNTER — Ambulatory Visit: Payer: BC Managed Care – PPO

## 2019-06-02 ENCOUNTER — Other Ambulatory Visit: Payer: Self-pay | Admitting: Adult Health

## 2019-06-02 DIAGNOSIS — G4700 Insomnia, unspecified: Secondary | ICD-10-CM

## 2019-06-15 ENCOUNTER — Ambulatory Visit
Admission: RE | Admit: 2019-06-15 | Discharge: 2019-06-15 | Disposition: A | Discharge status: Home / Self Care | Payer: BC Managed Care – PPO | Source: Ambulatory Visit | Attending: Adult Health | Admitting: Adult Health

## 2019-06-15 ENCOUNTER — Other Ambulatory Visit: Payer: Self-pay

## 2019-06-15 ENCOUNTER — Other Ambulatory Visit: Payer: Self-pay | Admitting: Adult Health

## 2019-06-15 DIAGNOSIS — R4181 Age-related cognitive decline: Secondary | ICD-10-CM

## 2019-06-15 DIAGNOSIS — Z1231 Encounter for screening mammogram for malignant neoplasm of breast: Secondary | ICD-10-CM

## 2019-06-18 ENCOUNTER — Telehealth: Payer: Self-pay | Admitting: Adult Health

## 2019-06-18 NOTE — Telephone Encounter (Signed)
Copied from [facility] [id number]. Topic: General - Inquiry >> Jun 18, 2019  3:48 PM Oneta Rack wrote: Patient inquiring about imaging results taken on 06/15/2019, please advise

## 2019-06-19 ENCOUNTER — Telehealth (INDEPENDENT_AMBULATORY_CARE_PROVIDER_SITE_OTHER): Payer: BC Managed Care – PPO | Admitting: Adult Health

## 2019-06-19 ENCOUNTER — Other Ambulatory Visit: Payer: Self-pay | Admitting: Adult Health

## 2019-06-19 ENCOUNTER — Other Ambulatory Visit: Payer: Self-pay

## 2019-06-19 DIAGNOSIS — R928 Other abnormal and inconclusive findings on diagnostic imaging of breast: Secondary | ICD-10-CM

## 2019-06-19 DIAGNOSIS — G4700 Insomnia, unspecified: Secondary | ICD-10-CM

## 2019-06-19 DIAGNOSIS — R4189 Other symptoms and signs involving cognitive functions and awareness: Secondary | ICD-10-CM

## 2019-06-19 NOTE — Progress Notes (Signed)
Virtual Visit via Video Note  I connected with Ellen [name] on 06/19/19 at  2:30 PM EST by a video enabled telemedicine application and verified that I am speaking with the correct person using two identifiers.  Location patient: home Location provider:work or home office Persons participating in the virtual visit: patient, provider  I discussed the limitations of evaluation and management by telemedicine and the availability of in person appointments. The patient expressed understanding and agreed to proceed.   HPI: 63 year old female for follow-up regarding MRI of brain.  In July she was seen for concern of cognitive impairment.  She reported that her symptoms have been present since September 2009 and her symptoms were intermittent but have been becoming more frequent.  She was having difficulty with word finding issues.  She was also endorsing episodes of getting lost while running errands.  Her MMSE was 29 out of 30.  Blood work was normal.  And her MRI that she had done yesterday showed:  IMPRESSION: No intracranial mass or hydrocephalus. No substantial qualitative volume loss. Mild chronic microvascular ischemic changes.  He continues to have the symptoms that were mentioned above.    ROS: See pertinent positives and negatives per HPI.  Past Medical History:  Diagnosis Date  . Alcohol abuse   . Depression   . Hyperlipidemia   . Hypertension   . Seasonal allergies     Past Surgical History:  Procedure Laterality Date  . ABDOMINAL HYSTERECTOMY  1998    Family History  Problem Relation Age of Onset  . High Cholesterol Mother   . High blood pressure Mother   . Diabetes Mother   . High blood pressure Father   . High Cholesterol Father   . Diabetes Father   . High blood pressure Brother   . High Cholesterol Brother      Current Outpatient Medications:  .  hydrochlorothiazide (HYDRODIURIL) 25 MG tablet, Take 1 tablet (25 mg total) by mouth daily., Disp: 90 tablet,  Rfl: 3 .  quinapril (ACCUPRIL) 20 MG tablet, Take 1 tablet (20 mg total) by mouth daily., Disp: 90 tablet, Rfl: 3 .  Rosuvastatin Calcium 40 MG CPSP, Take 40 mg by mouth daily., Disp: 90 capsule, Rfl: 3 .  sertraline (ZOLOFT) 50 MG tablet, Take 1 tablet (50 mg total) by mouth daily., Disp: 90 tablet, Rfl: 1 .  traZODone (DESYREL) 100 MG tablet, TAKE 2 TABLETS (200 MG TOTAL) BY MOUTH AT BEDTIME., Disp: 180 tablet, Rfl: 0  EXAM:  VITALS per patient if applicable:  GENERAL: alert, oriented, appears well and in no acute distress  HEENT: atraumatic, conjunttiva clear, no obvious abnormalities on inspection of external nose and ears  NECK: normal movements of the head and neck  LUNGS: on inspection no signs of respiratory distress, breathing rate appears normal, no obvious gross SOB, gasping or wheezing  CV: no obvious cyanosis  MS: moves all visible extremities without noticeable abnormality  PSYCH/NEURO: pleasant and cooperative, no obvious depression or anxiety, speech and thought processing grossly intact  ASSESSMENT AND PLAN:  Discussed the following assessment and plan:  Cognitive impairment is believed to be the result at this time of trazodone use.  I will have her start cutting back 200 mg.  She was advised to use 150 mg for the next month and then follow-up.  We will likely cut her back further to 100 mg.   I discussed the assessment and treatment plan with the patient. The patient was provided an opportunity to ask  questions and all were answered. The patient agreed with the plan and demonstrated an understanding of the instructions.   The patient was advised to call back or seek an in-person evaluation if the symptoms worsen or if the condition fails to improve as anticipated.   Dorothyann Peng, NP

## 2019-06-19 NOTE — Telephone Encounter (Signed)
See result note.  

## 2019-06-20 ENCOUNTER — Encounter: Payer: Self-pay | Admitting: Family Medicine

## 2019-06-20 NOTE — Telephone Encounter (Signed)
Sent to the pharmacy by e-scribe for 90 days.  Letter released to MyChart. 

## 2019-06-22 ENCOUNTER — Ambulatory Visit
Admission: RE | Admit: 2019-06-22 | Discharge: 2019-06-22 | Disposition: A | Discharge status: Home / Self Care | Payer: BC Managed Care – PPO | Source: Ambulatory Visit | Attending: Adult Health | Admitting: Adult Health

## 2019-06-22 ENCOUNTER — Other Ambulatory Visit: Payer: Self-pay | Admitting: Adult Health

## 2019-06-22 ENCOUNTER — Other Ambulatory Visit: Payer: Self-pay

## 2019-06-22 DIAGNOSIS — N6320 Unspecified lump in the left breast, unspecified quadrant: Secondary | ICD-10-CM

## 2019-06-22 DIAGNOSIS — R928 Other abnormal and inconclusive findings on diagnostic imaging of breast: Secondary | ICD-10-CM

## 2019-06-26 ENCOUNTER — Other Ambulatory Visit: Payer: Self-pay | Admitting: Adult Health

## 2019-06-26 DIAGNOSIS — R251 Tremor, unspecified: Secondary | ICD-10-CM

## 2019-06-28 ENCOUNTER — Ambulatory Visit
Admission: RE | Admit: 2019-06-28 | Discharge: 2019-06-28 | Disposition: A | Discharge status: Home / Self Care | Payer: BC Managed Care – PPO | Source: Ambulatory Visit | Attending: Adult Health | Admitting: Adult Health

## 2019-06-28 ENCOUNTER — Other Ambulatory Visit: Payer: Self-pay

## 2019-06-28 DIAGNOSIS — N6320 Unspecified lump in the left breast, unspecified quadrant: Secondary | ICD-10-CM

## 2019-09-30 ENCOUNTER — Other Ambulatory Visit: Payer: Self-pay | Admitting: Adult Health

## 2019-10-04 ENCOUNTER — Other Ambulatory Visit: Payer: Self-pay | Admitting: Adult Health

## 2019-10-04 DIAGNOSIS — G4700 Insomnia, unspecified: Secondary | ICD-10-CM

## 2019-10-05 ENCOUNTER — Telehealth: Payer: Self-pay | Admitting: Adult Health

## 2019-10-05 DIAGNOSIS — G4700 Insomnia, unspecified: Secondary | ICD-10-CM

## 2019-10-05 NOTE — Telephone Encounter (Signed)
Medication Refill: Trazodone  Pharmacy: [facility] [address] [id number]

## 2019-10-06 ENCOUNTER — Other Ambulatory Visit: Payer: Self-pay | Admitting: Adult Health

## 2019-10-06 DIAGNOSIS — R251 Tremor, unspecified: Secondary | ICD-10-CM

## 2019-10-08 ENCOUNTER — Other Ambulatory Visit: Payer: Self-pay | Admitting: Adult Health

## 2019-10-08 DIAGNOSIS — G4700 Insomnia, unspecified: Secondary | ICD-10-CM

## 2019-10-08 NOTE — Telephone Encounter (Signed)
Okay for refill? Please advise 

## 2019-10-09 MED ORDER — TRAZODONE HCL 100 MG PO TABS: 200.0000 mg | ORAL_TABLET | Freq: Every day | ORAL | 0 refills | Status: DC

## 2019-10-09 NOTE — Telephone Encounter (Signed)
Trazodone sent into pharmacy. Please advise that she will need to schedule a CPE for further refills of medications

## 2019-10-09 NOTE — Telephone Encounter (Signed)
MyChart message sent to patient.

## 2019-10-10 MED ORDER — ROSUVASTATIN CALCIUM 40 MG PO CPSP: 40.0000 mg | ORAL_CAPSULE | Freq: Every day | ORAL | 0 refills | Status: DC

## 2019-10-10 NOTE — Telephone Encounter (Signed)
30 days of generic Crestor sent to the pharmacy.  Pt is past due for cpx.  Trazodone has been filled.  Nothing further needed.

## 2019-11-08 ENCOUNTER — Other Ambulatory Visit: Payer: Self-pay | Admitting: Adult Health

## 2019-11-08 NOTE — Telephone Encounter (Signed)
DENIED.  PT NEEDS YEARLY VISIT AND LAB WORK WITH [facility].

## 2019-12-01 ENCOUNTER — Other Ambulatory Visit: Payer: Self-pay | Admitting: Adult Health

## 2019-12-04 ENCOUNTER — Other Ambulatory Visit: Payer: Self-pay | Admitting: Adult Health

## 2020-01-30 ENCOUNTER — Other Ambulatory Visit: Payer: Self-pay | Admitting: Adult Health

## 2020-01-30 DIAGNOSIS — G4700 Insomnia, unspecified: Secondary | ICD-10-CM

## 2020-02-01 NOTE — Telephone Encounter (Signed)
SENT TO THE PHARMACY BY E-SCRIBE.  PT HAS UPCOMING CPX.

## 2020-03-12 ENCOUNTER — Encounter: Payer: BC Managed Care – PPO | Admitting: Adult Health

## 2020-03-12 DIAGNOSIS — R251 Tremor, unspecified: Secondary | ICD-10-CM

## 2020-03-12 DIAGNOSIS — G4700 Insomnia, unspecified: Secondary | ICD-10-CM | POA: Insufficient documentation

## 2020-03-12 HISTORY — DX: Insomnia, unspecified: G47.00

## 2020-03-12 HISTORY — DX: Tremor, unspecified: R25.1

## 2020-03-12 NOTE — Progress Notes (Deleted)
Subjective:    Patient ID: Ellen [name], female    DOB: December 07, 1955, 63 y.o.   MRN: [id number]  HPI Patient presents for yearly preventative medicine examination. She is a pleasant 64 year old female who  has a past medical history of Alcohol abuse, Depression, Hyperlipidemia, Hypertension, and Seasonal allergies.  H/o Alcohol Abuse -completed rehab in 2019. She continues to go to AA. She continues to do well with her sobriety and does not report any slip ups. She has been sober since February 03, 2018.  Anxiety/depression-is prescribed Zoloft . She does feel well controlled  Insomnia-takes trazodone nightly.  Essential hypertension-currently prescribed Accupril 20 mg daily and HCTZ 25 mg daily. She denies dizziness, lightheadedness, chest pain, or shortness of breath.   Hyperlipidemia-takes Crestor 40 mg. She denies myalgia or fatigue  Essential tremor-takes Cogentin 1 mg every other day.  Tremors are still bothersome especially when she is working  All immunizations and health maintenance protocols were reviewed with the patient and needed orders were placed.  Appropriate screening laboratory values were ordered for the patient including screening of hyperlipidemia, renal function and hepatic function.  Medication reconciliation,  past medical history, social history, problem list and allergies were reviewed in detail with the patient  Goals were established with regard to weight loss, exercise, and  diet in compliance with medications  Wt Readings from Last 3 Encounters:  02/15/19 183 lb (83 kg)  06/21/18 170 lb 3.2 oz (77.2 kg)  04/18/18 160 lb (72.6 kg)    She is up-to-date on colon cancer screening as well as mammograms and GYN care.  Review of Systems  Constitutional: Negative.   HENT: Negative.   Eyes: Negative.   Respiratory: Negative.   Cardiovascular: Negative.   Gastrointestinal: Negative.   Endocrine: Negative.   Genitourinary: Negative.    Musculoskeletal: Negative.   Skin: Negative.   Allergic/Immunologic: Negative.   Neurological: Negative.   Hematological: Negative.    Past Medical History:  Diagnosis Date  . Alcohol abuse   . Depression   . Hyperlipidemia   . Hypertension   . Seasonal allergies     Social History   Socioeconomic History  . Marital status: Married    Spouse name: Not on file  . Number of children: Not on file  . Years of education: Not on file  . Highest education level: Not on file  Occupational History  . Not on file  Tobacco Use  . Smoking status: Former Research scientist (life sciences)  . Smokeless tobacco: Never Used  Vaping Use  . Vaping Use: Never used  Substance and Sexual Activity  . Alcohol use: Not Currently  . Drug use: Never  . Sexual activity: Not on file  Other Topics Concern  . 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 Difficulty of Paying Living Expenses:   Food Insecurity:   . Worried About Charity fundraiser in the Last Year:   . Arboriculturist in the Last Year:   Transportation Needs:   . Film/video editor (Medical):   Marland Kitchen Lack of Transportation (Non-Medical):   Physical Activity:   . Days of Exercise per Week:   . Minutes of Exercise per Session:   Stress:   . Feeling of Stress :   Social Connections:   .  Frequency of Communication with Friends and Family:   . Frequency of Social Gatherings with Friends and Family:   . Attends Religious Services:   . Active Member of Clubs or Organizations:   . Attends Archivist Meetings:   Marland Kitchen Marital Status:   Intimate Partner Violence:   . Fear of Current or Ex-Partner:   . Emotionally Abused:   Marland Kitchen Physically Abused:   . Sexually Abused:     Past Surgical History:  Procedure Laterality Date  . ABDOMINAL HYSTERECTOMY  1998    Family History  Problem Relation Age of Onset  .  High Cholesterol Mother   . High blood pressure Mother   . Diabetes Mother   . High blood pressure Father   . High Cholesterol Father   . Diabetes Father   . High blood pressure Brother   . High Cholesterol Brother     No Known Allergies  Current Outpatient Medications on File Prior to Visit  Medication Sig Dispense Refill  . benztropine (COGENTIN) 1 MG tablet TAKE 1 TABLET BY MOUTH EVERY DAY 90 tablet 1  . hydrochlorothiazide (HYDRODIURIL) 25 MG tablet TAKE 1 TABLET BY MOUTH EVERY DAY 90 tablet 0  . quinapril (ACCUPRIL) 20 MG tablet Take 1 tablet (20 mg total) by mouth daily. 90 tablet 3  . Rosuvastatin Calcium 40 MG CPSP Take 40 mg by mouth daily. **DUE FOR YEARLY PHYSICAL WITH Ellen [name]** 30 capsule 0  . sertraline (ZOLOFT) 50 MG tablet TAKE 1 TABLET BY MOUTH EVERY DAY 90 tablet 0  . traZODone (DESYREL) 100 MG tablet TAKE 2 TABLETS (200 MG TOTAL) BY MOUTH AT BEDTIME. 180 tablet 0   No current facility-administered medications on file prior to visit.    There were no vitals taken for this visit.      Objective:   Physical Exam Vitals and nursing note reviewed.  Constitutional:      General: She is not in acute distress.    Appearance: Normal appearance. She is well-developed. She is not ill-appearing.  HENT:     Head: Normocephalic and atraumatic.     Right Ear: Tympanic membrane, ear canal and external ear normal. There is no impacted cerumen.     Left Ear: Tympanic membrane, ear canal and external ear normal. There is no impacted cerumen.     Nose: Nose normal. No congestion or rhinorrhea.     Mouth/Throat:     Mouth: Mucous membranes are moist.     Pharynx: Oropharynx is clear. No oropharyngeal exudate or posterior oropharyngeal erythema.  Eyes:     General:        Right eye: No discharge.        Left eye: No discharge.     Extraocular Movements: Extraocular movements intact.     Conjunctiva/sclera: Conjunctivae normal.     Pupils: Pupils are equal, round, and reactive  to light.  Neck:     Thyroid: No thyromegaly.     Vascular: No carotid bruit.     Trachea: No tracheal deviation.  Cardiovascular:     Rate and Rhythm: Normal rate and regular rhythm.     Pulses: Normal pulses.     Heart sounds: Normal heart sounds. No murmur heard.  No friction rub. No gallop.   Pulmonary:     Effort: Pulmonary effort is normal. No respiratory distress.     Breath sounds: Normal breath sounds. No stridor. No wheezing, rhonchi or rales.  Chest:     Chest wall: No tenderness.  Abdominal:     General: Abdomen is flat. Bowel sounds are normal. There is no distension.     Palpations: Abdomen is soft. There is no mass.     Tenderness: There is no abdominal tenderness. There is no right CVA tenderness, left CVA tenderness, guarding or rebound.     Hernia: No hernia is present.  Musculoskeletal:        General: No swelling, tenderness, deformity or signs of injury. Normal range of motion.     Cervical back: Normal range of motion and neck supple.     Right lower leg: No edema.     Left lower leg: No edema.  Lymphadenopathy:     Cervical: No cervical adenopathy.  Skin:    General: Skin is warm and dry.     Coloration: Skin is not jaundiced or pale.     Findings: No bruising, erythema, lesion or rash.  Neurological:     General: No focal deficit present.     Mental Status: She is alert and oriented to person, place, and time.     Cranial Nerves: No cranial nerve deficit.     Sensory: No sensory deficit.     Motor: No weakness.     Coordination: Coordination normal.     Gait: Gait normal.     Deep Tendon Reflexes: Reflexes normal.  Psychiatric:        Mood and Affect: Mood normal.        Behavior: Behavior normal.        Thought Content: Thought content normal.        Judgment: Judgment normal.         Assessment & Plan:

## 2020-04-23 ENCOUNTER — Other Ambulatory Visit: Payer: Self-pay | Admitting: Adult Health

## 2020-04-23 DIAGNOSIS — G4700 Insomnia, unspecified: Secondary | ICD-10-CM

## 2020-04-23 DIAGNOSIS — R251 Tremor, unspecified: Secondary | ICD-10-CM

## 2020-05-18 ENCOUNTER — Other Ambulatory Visit: Payer: Self-pay | Admitting: Adult Health

## 2020-05-18 DIAGNOSIS — R251 Tremor, unspecified: Secondary | ICD-10-CM

## 2020-05-18 DIAGNOSIS — G4700 Insomnia, unspecified: Secondary | ICD-10-CM

## 2020-05-21 ENCOUNTER — Encounter: Payer: Self-pay | Admitting: Neurology

## 2020-05-21 ENCOUNTER — Encounter: Payer: Self-pay | Admitting: Adult Health

## 2020-05-21 ENCOUNTER — Ambulatory Visit (INDEPENDENT_AMBULATORY_CARE_PROVIDER_SITE_OTHER): Payer: BC Managed Care – PPO | Admitting: Adult Health

## 2020-05-21 ENCOUNTER — Other Ambulatory Visit: Payer: Self-pay

## 2020-05-21 VITALS — BP 120/70 | HR 62 | Temp 98.1°F | Resp 12 | Ht 67.0 in | Wt 171.2 lb

## 2020-05-21 DIAGNOSIS — G4700 Insomnia, unspecified: Secondary | ICD-10-CM

## 2020-05-21 DIAGNOSIS — R4181 Age-related cognitive decline: Secondary | ICD-10-CM

## 2020-05-21 DIAGNOSIS — Z23 Encounter for immunization: Secondary | ICD-10-CM

## 2020-05-21 DIAGNOSIS — I10 Essential (primary) hypertension: Secondary | ICD-10-CM | POA: Diagnosis not present

## 2020-05-21 DIAGNOSIS — R251 Tremor, unspecified: Secondary | ICD-10-CM

## 2020-05-21 DIAGNOSIS — F32A Depression, unspecified: Secondary | ICD-10-CM

## 2020-05-21 DIAGNOSIS — F419 Anxiety disorder, unspecified: Secondary | ICD-10-CM

## 2020-05-21 DIAGNOSIS — Z0000 Encounter for general adult medical examination without abnormal findings: Secondary | ICD-10-CM

## 2020-05-21 DIAGNOSIS — E782 Mixed hyperlipidemia: Secondary | ICD-10-CM | POA: Diagnosis not present

## 2020-05-21 DIAGNOSIS — E559 Vitamin D deficiency, unspecified: Secondary | ICD-10-CM

## 2020-05-21 MED ORDER — HYDROCHLOROTHIAZIDE 25 MG PO TABS: 25.0000 mg | ORAL_TABLET | Freq: Every day | ORAL | 3 refills | Status: DC

## 2020-05-21 MED ORDER — QUINAPRIL HCL 20 MG PO TABS: 20.0000 mg | ORAL_TABLET | Freq: Every day | ORAL | 3 refills | Status: DC

## 2020-05-21 MED ORDER — SERTRALINE HCL 50 MG PO TABS: 50.0000 mg | ORAL_TABLET | Freq: Every day | ORAL | 1 refills | Status: DC

## 2020-05-21 NOTE — Patient Instructions (Signed)
It was great seeing you today.   We will follow up with you regarding your blood work   Continue to stay active and eat healthy   Someone from Neurology will call you to schedule your appointment

## 2020-05-21 NOTE — Progress Notes (Signed)
Subjective:    Patient ID: Ellen [name], female    DOB: 08/14/1956, 64 y.o.   MRN: [id number]  HPI Patient presents for yearly preventative medicine examination. He is a pleasant 64 year old female who  has a past medical history of Alcohol abuse, Depression, Hyperlipidemia, Hypertension, and Seasonal allergies.   She reports that she has retired and is looking for a part time job.   Anxiety/Depression -describe Zoloft daily and Vistaril as needed.  She does feel as though her symptoms are controlled on these medications.  She denies SI or HI  Insomnia-takes trazodone 200 mg nightly.  She does report that she feels as though she gets restful sleep does not feel tired when she wakes up or throughout the day  Essential hypertension-is currently prescribed Accupril 20 mg daily and HCTZ 25 mg daily.  Her blood pressure is well controlled on this combination.  She denies dizziness, lightheadedness, chest pain, or shortness of breath  Hyperlipidemia-takes Crestor 40 mg daily.  She denies myalgia or fatigue  Essential Tremor - uses Cogentin 1 mg daily feels controlled.   Cognitive Impairment -she was worked up in October 2020.  At this time she reported that her symptoms have been present since around September 2009 but she fell as of symptoms were becoming more frequent.  She was having difficulty with finding issues and was also endorsing episodes of getting lost while running errands.  Her MMSE was 29 out of 30, blood work was normal, and her MRI showed:  IMPRESSION: No intracranial mass or hydrocephalus. No substantial qualitative volume loss. Mild chronic microvascular ischemic changes.  She reports that she continues to have difficulty completing full sentences, retaining information, losing thought midsentence, and feels as though she is walking with a lean to the right.  Who does report that her friends have started to notice as well.  She would like to be referred to a neurologist  for further evaluation  All immunizations and health maintenance protocols were reviewed with the patient and needed orders were placed. She is up to date on all vaccinations   Appropriate screening laboratory values were ordered for the patient including screening of hyperlipidemia, renal function and hepatic function.  Medication reconciliation,  past medical history, social history, problem list and allergies were reviewed in detail with the patient  Goals were established with regard to weight loss, exercise, and  diet in compliance with medications Wt Readings from Last 3 Encounters:  05/21/20 171 lb 3.2 oz (77.7 kg)  02/15/19 183 lb (83 kg)  06/21/18 170 lb 3.2 oz (77.2 kg)    Review of Systems  Constitutional: Negative.   HENT: Negative.   Eyes: Negative.   Respiratory: Negative.   Cardiovascular: Negative.   Gastrointestinal: Negative.   Endocrine: Negative.   Genitourinary: Negative.   Musculoskeletal: Negative.   Skin: Negative.   Allergic/Immunologic: Negative.   Neurological: Negative.   Hematological: Negative.   Psychiatric/Behavioral: Negative.    Past Medical History:  Diagnosis Date   Alcohol abuse    Depression    Hyperlipidemia    Hypertension    Seasonal allergies     Social History   Socioeconomic History   Marital status: Married    Spouse name: Not on file   Number of children: Not on file   Years of education: Not on file   Highest education level: Not on file  Occupational History   Not on file  Tobacco Use   Smoking status: Former Smoker  Smokeless tobacco: Never Used  Vaping Use   Vaping Use: Never used  Substance and Sexual Activity   Alcohol use: Not Currently   Drug use: Never   Sexual activity: Not on file  Other Topics Concern   Not on file  Social History Narrative   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    Married to partner       Social Determinants of Radio broadcast assistant  Strain:    Difficulty of Paying Living Expenses: Not on file  Food Insecurity:    Worried About Charity fundraiser in the Last Year: Not on file   [place] in the Last Year: Not on file  Transportation Needs:    Lack of Transportation (Medical): Not on file   Lack of Transportation (Non-Medical): Not on file  Physical Activity:    Days of Exercise per Week: Not on file   Minutes of Exercise per Session: Not on file  Stress:    Feeling of Stress : Not on file  Social Connections:    Frequency of Communication with Friends and Family: Not on file   Frequency of Social Gatherings with Friends and Family: Not on file   Attends Religious Services: Not on file   Active Member of Clubs or Organizations: Not on file   Attends Archivist Meetings: Not on file   Marital Status: Not on file  Intimate Partner Violence:    Fear of Current or Ex-Partner: Not on file   Emotionally Abused: Not on file   Physically Abused: Not on file   Sexually Abused: Not on file    Past Surgical History:  Procedure Laterality Date   ABDOMINAL HYSTERECTOMY  1998    Family History  Problem Relation Age of Onset   High Cholesterol Mother    High blood pressure Mother    Diabetes Mother    High blood pressure Father    High Cholesterol Father    Diabetes Father    High blood pressure Brother    High Cholesterol Brother     No Known Allergies  Current Outpatient Medications on File Prior to Visit  Medication Sig Dispense Refill   benztropine (COGENTIN) 1 MG tablet TAKE 1 TABLET BY MOUTH EVERY DAY 90 tablet 1   rosuvastatin (CRESTOR) 40 MG tablet TAKE 40 MG BY MOUTH DAILY. **DUE FOR YEARLY PHYSICAL WITH Amandamarie Feggins** 30 tablet 0   traZODone (DESYREL) 100 MG tablet TAKE 2 TABLETS (200 MG TOTAL) BY MOUTH AT BEDTIME. 180 tablet 1   No current facility-administered medications on file prior to visit.    BP 120/70 (BP Location: Right Arm, Patient Position:  Sitting, Cuff Size: Small)    Pulse 62    Temp 98.1 F (36.7 C) (Oral)    Resp 12    Ht _0  (1.702 m)    Wt 171 lb 3.2 oz (77.7 kg)    SpO2 97%    BMI 26.81 kg/m       Objective:   Physical Exam Vitals and nursing note reviewed.  Constitutional:      General: She is not in acute distress.    Appearance: Normal appearance. She is well-developed. She is not ill-appearing.  HENT:     Head: Normocephalic and atraumatic.     Right Ear: Tympanic membrane, ear canal and external ear normal. There is no impacted cerumen.     Left Ear: Tympanic membrane, ear canal and external ear  normal. There is no impacted cerumen.     Nose: Nose normal. No congestion or rhinorrhea.     Mouth/Throat:     Mouth: Mucous membranes are moist.     Pharynx: Oropharynx is clear. No oropharyngeal exudate or posterior oropharyngeal erythema.  Eyes:     General:        Right eye: No discharge.        Left eye: No discharge.     Extraocular Movements: Extraocular movements intact.     Conjunctiva/sclera: Conjunctivae normal.     Pupils: Pupils are equal, round, and reactive to light.  Neck:     Thyroid: No thyromegaly.     Vascular: No carotid bruit.     Trachea: No tracheal deviation.  Cardiovascular:     Rate and Rhythm: Normal rate and regular rhythm.     Pulses: Normal pulses.     Heart sounds: Normal heart sounds. No murmur heard.  No friction rub. No gallop.   Pulmonary:     Effort: Pulmonary effort is normal. No respiratory distress.     Breath sounds: Normal breath sounds. No stridor. No wheezing, rhonchi or rales.  Chest:     Chest wall: No tenderness.  Abdominal:     General: Abdomen is flat. Bowel sounds are normal. There is no distension.     Palpations: Abdomen is soft. There is no mass.     Tenderness: There is no abdominal tenderness. There is no right CVA tenderness, left CVA tenderness, guarding or rebound.     Hernia: No hernia is present.  Musculoskeletal:        General: No  swelling, tenderness, deformity or signs of injury. Normal range of motion.     Cervical back: Normal range of motion and neck supple.     Right lower leg: No edema.     Left lower leg: No edema.  Lymphadenopathy:     Cervical: No cervical adenopathy.  Skin:    General: Skin is warm and dry.     Capillary Refill: Capillary refill takes less than 2 seconds.     Coloration: Skin is not jaundiced or pale.     Findings: No bruising, erythema, lesion or rash.  Neurological:     General: No focal deficit present.     Mental Status: She is alert and oriented to person, place, and time.     Cranial Nerves: No cranial nerve deficit.     Sensory: No sensory deficit.     Motor: No weakness.     Coordination: Coordination normal.     Gait: Gait normal.     Deep Tendon Reflexes: Reflexes normal.  Psychiatric:        Attention and Perception: Attention and perception normal.        Mood and Affect: Mood normal.        Speech: Speech normal.        Behavior: Behavior normal.        Thought Content: Thought content normal.        Cognition and Memory: Cognition and memory normal.        Judgment: Judgment normal.        Assessment & Plan:  1. Routine general medical examination at a health care facility - Follow up in one year  - Encouraged heart healthy diet and exercise - CBC with Differential/Platelet; Future - Lipid panel; Future - TSH; Future - CMP with eGFR([facility]); Future - CMP with eGFR([facility]) - TSH - Lipid panel -  CBC with Differential/Platelet  2. Mixed hyperlipidemia - Consider increase in statin  - CBC with Differential/Platelet; Future - Lipid panel; Future - TSH; Future - CMP with eGFR([facility]); Future - CMP with eGFR([facility]) - TSH - Lipid panel - CBC with Differential/Platelet  3. Essential hypertension - Well controlled.  - Continue with current therapy  - quinapril (ACCUPRIL) 20 MG tablet; Take 1 tablet (20 mg total) by mouth daily.  Dispense: 90 tablet;  Refill: 3 - hydrochlorothiazide (HYDRODIURIL) 25 MG tablet; Take 1 tablet (25 mg total) by mouth daily.  Dispense: 90 tablet; Refill: 3 - CBC with Differential/Platelet; Future - Lipid panel; Future - TSH; Future - CMP with eGFR([facility]); Future - CMP with eGFR([facility]) - TSH - Lipid panel - CBC with Differential/Platelet  4. Tremors of nervous system - Continue with Cogentin  - CBC with Differential/Platelet; Future - Lipid panel; Future - TSH; Future - CMP with eGFR([facility]); Future - CMP with eGFR([facility]) - TSH - Lipid panel - CBC with Differential/Platelet  5. Anxiety and depression - Continue Zoloft 50 mg daily.  - CBC with Differential/Platelet; Future - Lipid panel; Future - TSH; Future - CMP with eGFR([facility]); Future - sertraline (ZOLOFT) 50 MG tablet; Take 1 tablet (50 mg total) by mouth daily.  Dispense: 90 tablet; Refill: 1 - CMP with eGFR([facility]) - TSH - Lipid panel - CBC with Differential/Platelet  6. Insomnia, unspecified type - Continue Trazodone  - CBC with Differential/Platelet; Future - Lipid panel; Future - TSH; Future - CMP with eGFR([facility]); Future - CMP with eGFR([facility]) - TSH - Lipid panel - CBC with Differential/Platelet  7. Vitamin D deficiency  - Vitamin D, 25-hydroxy; Future - Vitamin D, 25-hydroxy  8. Age-related cognitive decline - No signs of cognitive impairment at this time  - Ambulatory referral to Neurology  9. Influenza vaccine needed  - Flu Vaccine QUAD 6+ mos PF IM (Fluarix Quad PF)  Dorothyann Peng, NP

## 2020-05-22 LAB — CBC WITH DIFFERENTIAL/PLATELET
Absolute Monocytes: 418 {cells}/uL (ref 200–950)
Basophils Absolute: 20 {cells}/uL (ref 0–200)
Basophils Relative: 0.4 %
Eosinophils Absolute: 219 {cells}/uL (ref 15–500)
Eosinophils Relative: 4.3 %
HCT: 44 % (ref 35.0–45.0)
Hemoglobin: 13.9 g/dL (ref 11.7–15.5)
Lymphs Abs: 1780 {cells}/uL (ref 850–3900)
MCH: 29.3 pg (ref 27.0–33.0)
MCHC: 31.6 g/dL — ABNORMAL LOW (ref 32.0–36.0)
MCV: 92.6 fL (ref 80.0–100.0)
MPV: 12.4 fL (ref 7.5–12.5)
Monocytes Relative: 8.2 %
Neutro Abs: 2662 {cells}/uL (ref 1500–7800)
Neutrophils Relative %: 52.2 %
Platelets: 197 Thousand/uL (ref 140–400)
RBC: 4.75 Million/uL (ref 3.80–5.10)
RDW: 13 % (ref 11.0–15.0)
Total Lymphocyte: 34.9 %
WBC: 5.1 Thousand/uL (ref 3.8–10.8)

## 2020-05-22 LAB — LIPID PANEL
Cholesterol: 303 mg/dL — ABNORMAL HIGH (ref <200)
HDL: 40 mg/dL — ABNORMAL LOW (ref >=50)
LDL Cholesterol (Calc): 215 mg/dL (calc) — ABNORMAL HIGH
Non-HDL Cholesterol (Calc): 263 mg/dL (calc) — ABNORMAL HIGH (ref <130)
Total CHOL/HDL Ratio: 7.6 (calc) — ABNORMAL HIGH (ref <5.0)
Triglycerides: 270 mg/dL — ABNORMAL HIGH (ref <150)

## 2020-05-22 LAB — COMPLETE METABOLIC PANEL WITHOUT GFR
AG Ratio: 1.6 (calc) (ref 1.0–2.5)
ALT: 6 U/L (ref 6–29)
AST: 10 U/L (ref 10–35)
Albumin: 4.3 g/dL (ref 3.6–5.1)
Alkaline phosphatase (APISO): 97 U/L (ref 37–153)
BUN/Creatinine Ratio: 11 (calc) (ref 6–22)
BUN: 11 mg/dL (ref 7–25)
CO2: 26 mmol/L (ref 20–32)
Calcium: 9.9 mg/dL (ref 8.6–10.4)
Chloride: 105 mmol/L (ref 98–110)
Creat: 1.01 mg/dL — ABNORMAL HIGH (ref 0.50–0.99)
GFR, Est African American: 68 mL/min/1.73m2
GFR, Est Non African American: 59 mL/min/1.73m2 — ABNORMAL LOW
Globulin: 2.7 g/dL (ref 1.9–3.7)
Glucose, Bld: 93 mg/dL (ref 65–99)
Potassium: 4.1 mmol/L (ref 3.5–5.3)
Sodium: 141 mmol/L (ref 135–146)
Total Bilirubin: 0.4 mg/dL (ref 0.2–1.2)
Total Protein: 7 g/dL (ref 6.1–8.1)

## 2020-05-22 LAB — TSH: TSH: 0.47 m[IU]/L (ref 0.40–4.50)

## 2020-05-22 LAB — VITAMIN D 25 HYDROXY (VIT D DEFICIENCY, FRACTURES): Vit D, 25-Hydroxy: 25 ng/mL — ABNORMAL LOW (ref 30–100)

## 2020-05-27 ENCOUNTER — Telehealth: Payer: Self-pay

## 2020-05-27 NOTE — Telephone Encounter (Signed)
See results note. 

## 2020-05-27 NOTE — Telephone Encounter (Signed)
Pt returning call for lab results  

## 2020-05-29 ENCOUNTER — Other Ambulatory Visit: Payer: Self-pay | Admitting: Adult Health

## 2020-05-29 MED ORDER — EZETIMIBE 10 MG PO TABS: 10.0000 mg | ORAL_TABLET | Freq: Every day | ORAL | 3 refills | Status: DC

## 2020-05-29 MED ORDER — ROSUVASTATIN CALCIUM 40 MG PO TABS: 40.0000 mg | ORAL_TABLET | Freq: Every day | ORAL | 3 refills | Status: DC

## 2020-07-08 ENCOUNTER — Encounter: Payer: Self-pay | Admitting: Adult Health

## 2020-08-20 ENCOUNTER — Other Ambulatory Visit: Payer: Self-pay

## 2020-08-21 ENCOUNTER — Ambulatory Visit (INDEPENDENT_AMBULATORY_CARE_PROVIDER_SITE_OTHER): Payer: Self-pay | Admitting: Adult Health

## 2020-08-21 ENCOUNTER — Encounter: Payer: Self-pay | Admitting: Adult Health

## 2020-08-21 VITALS — BP 100/74 | Temp 98.7°F | Wt 169.0 lb

## 2020-08-21 DIAGNOSIS — D649 Anemia, unspecified: Secondary | ICD-10-CM

## 2020-08-21 NOTE — Progress Notes (Signed)
   Subjective:    Patient ID: Ellen [name], female    DOB: 25-Jan-1956, 65 y.o.   MRN: [id number]  HPI    Review of Systems     Objective:   Physical Exam        Assessment & Plan:

## 2020-08-21 NOTE — Progress Notes (Signed)
Subjective:    Patient ID: Ellen [name], female    DOB: 01-24-1956, 65 y.o.   MRN: [id number]  HPI 65 year old female who  has a past medical history of Alcohol abuse, Depression, Hyperlipidemia, Hypertension, and Seasonal allergies.  She is being evaluated today for an acute issue.  She went to give blood this past week and was told that she cannot give because her hemoglobin was low at 9.0.  She has no history of anemia in the past.  She does report fatigue and episodes of dizziness.  She denies rectal bleeding.  Lab Results  Component Value Date   WBC 5.1 05/21/2020   HGB 13.9 05/21/2020   HCT 44.0 05/21/2020   MCV 92.6 05/21/2020   PLT 197 05/21/2020      Review of Systems See HPI   Past Medical History:  Diagnosis Date  . Alcohol abuse   . Depression   . Hyperlipidemia   . Hypertension   . Seasonal allergies     Social History   Socioeconomic History  . Marital status: Married    Spouse name: Not on file  . Number of children: Not on file  . Years of education: Not on file  . Highest education level: Not on file  Occupational History  . Not on file  Tobacco Use  . Smoking status: Former Games developer  . Smokeless tobacco: Never Used  Vaping Use  . Vaping Use: Never used  Substance and Sexual Activity  . Alcohol use: Not Currently  . Drug use: Never  . Sexual activity: Not on file  Other Topics Concern  . Not on file  Social History Narrative   Works at [employer] as Engineer, site for the computer lab    Married to partner       Social Determinants of Corporate investment banker Strain: Not on file  Food Insecurity: Not on file  Transportation Needs: Not on file  Physical Activity: Not on file  Stress: Not on file  Social Connections: Not on file  Intimate Partner Violence: Not on file    Past Surgical History:  Procedure Laterality Date  . ABDOMINAL HYSTERECTOMY  1998    Family History  Problem Relation Age of Onset  . High Cholesterol  Mother   . High blood pressure Mother   . Diabetes Mother   . High blood pressure Father   . High Cholesterol Father   . Diabetes Father   . High blood pressure Brother   . High Cholesterol Brother     No Known Allergies  Current Outpatient Medications on File Prior to Visit  Medication Sig Dispense Refill  . benztropine (COGENTIN) 1 MG tablet TAKE 1 TABLET BY MOUTH EVERY DAY 90 tablet 1  . ezetimibe (ZETIA) 10 MG tablet Take 1 tablet (10 mg total) by mouth daily. 90 tablet 3  . hydrochlorothiazide (HYDRODIURIL) 25 MG tablet Take 1 tablet (25 mg total) by mouth daily. 90 tablet 3  . rosuvastatin (CRESTOR) 40 MG tablet Take 1 tablet (40 mg total) by mouth daily. 90 tablet 3  . sertraline (ZOLOFT) 50 MG tablet Take 1 tablet (50 mg total) by mouth daily. 90 tablet 1  . quinapril (ACCUPRIL) 20 MG tablet Take 1 tablet (20 mg total) by mouth daily. 90 tablet 3  . traZODone (DESYREL) 100 MG tablet TAKE 2 TABLETS (200 MG TOTAL) BY MOUTH AT BEDTIME. 180 tablet 1   No current facility-administered medications on file prior to visit.  BP 100/74   Temp 98.7 F (37.1 C)   Wt 169 lb (76.7 kg)   BMI 26.47 kg/m       Objective:   Physical Exam Vitals and nursing note reviewed.  Constitutional:      Appearance: Normal appearance.  Cardiovascular:     Rate and Rhythm: Normal rate and regular rhythm.     Pulses: Normal pulses.     Heart sounds: Normal heart sounds.  Pulmonary:     Effort: Pulmonary effort is normal.     Breath sounds: Normal breath sounds.  Skin:    General: Skin is warm and dry.     Capillary Refill: Capillary refill takes less than 2 seconds.  Neurological:     General: No focal deficit present.     Mental Status: She is alert and oriented to person, place, and time.  Psychiatric:        Mood and Affect: Mood normal.        Behavior: Behavior normal.        Thought Content: Thought content normal.        Judgment: Judgment normal.       Assessment & Plan:   1. Low hemoglobin  - CBC with Differential/Platelet; Future - Iron, TIBC and Ferritin Panel; Future - Vitamin B12; Future  Shirline Frees, NP

## 2020-08-25 ENCOUNTER — Other Ambulatory Visit: Payer: Self-pay

## 2020-08-26 ENCOUNTER — Other Ambulatory Visit (INDEPENDENT_AMBULATORY_CARE_PROVIDER_SITE_OTHER): Payer: Self-pay

## 2020-08-26 DIAGNOSIS — D649 Anemia, unspecified: Secondary | ICD-10-CM

## 2020-08-26 LAB — CBC WITH DIFFERENTIAL/PLATELET
Basophils Absolute: 0 10*3/uL (ref 0.0–0.1)
Basophils Relative: 0.6 % (ref 0.0–3.0)
Eosinophils Absolute: 0.3 10*3/uL (ref 0.0–0.7)
Eosinophils Relative: 5.3 % — ABNORMAL HIGH (ref 0.0–5.0)
HCT: 36.5 % (ref 36.0–46.0)
Hemoglobin: 12.2 g/dL (ref 12.0–15.0)
Lymphocytes Relative: 35 % (ref 12.0–46.0)
Lymphs Abs: 1.8 10*3/uL (ref 0.7–4.0)
MCHC: 33.4 g/dL (ref 30.0–36.0)
MCV: 92.3 fl (ref 78.0–100.0)
Monocytes Absolute: 0.5 10*3/uL (ref 0.1–1.0)
Monocytes Relative: 8.8 % (ref 3.0–12.0)
Neutro Abs: 2.6 10*3/uL (ref 1.4–7.7)
Neutrophils Relative %: 50.3 % (ref 43.0–77.0)
Platelets: 202 10*3/uL (ref 150.0–400.0)
RBC: 3.95 Mil/uL (ref 3.87–5.11)
RDW: 13.9 % (ref 11.5–15.5)
WBC: 5.2 10*3/uL (ref 4.0–10.5)

## 2020-08-26 LAB — FERRITIN: Ferritin: 158.4 ng/mL (ref 10.0–291.0)

## 2020-08-26 LAB — IRON: Iron: 64 ug/dL (ref 42–145)

## 2020-08-26 LAB — IBC PANEL
Iron: 64 ug/dL (ref 42–145)
Saturation Ratios: 25.1 % (ref 20.0–50.0)
Transferrin: 182 mg/dL — ABNORMAL LOW (ref 212.0–360.0)

## 2020-08-26 LAB — VITAMIN B12: Vitamin B-12: 316 pg/mL (ref 211–911)

## 2020-08-26 NOTE — Addendum Note (Signed)
Addended by: Marrion Coy on: 08/26/2020 09:09 AM   Modules accepted: Orders

## 2020-08-28 ENCOUNTER — Ambulatory Visit: Payer: BC Managed Care – PPO | Admitting: Neurology

## 2020-08-29 ENCOUNTER — Telehealth: Payer: Self-pay | Admitting: Adult Health

## 2020-08-29 NOTE — Telephone Encounter (Signed)
Pt is returning the call to the office for her lab results 

## 2020-08-29 NOTE — Telephone Encounter (Signed)
I notified the pt of the results.  See lab result.  Nothing further needed.

## 2020-09-04 ENCOUNTER — Other Ambulatory Visit: Payer: Self-pay | Admitting: Adult Health

## 2020-09-04 DIAGNOSIS — G4700 Insomnia, unspecified: Secondary | ICD-10-CM

## 2020-09-05 NOTE — Telephone Encounter (Signed)
SENT TO THE PHARMACY ON 05/19/2020 FOR 6 MONTHS.  REQUEST IS TOO EARLY.

## 2020-09-27 ENCOUNTER — Other Ambulatory Visit: Payer: Self-pay | Admitting: Adult Health

## 2020-09-27 DIAGNOSIS — G4700 Insomnia, unspecified: Secondary | ICD-10-CM

## 2020-09-29 ENCOUNTER — Other Ambulatory Visit: Payer: Self-pay | Admitting: Adult Health

## 2020-09-29 DIAGNOSIS — R251 Tremor, unspecified: Secondary | ICD-10-CM

## 2020-09-30 NOTE — Telephone Encounter (Signed)
THIS WAS SENT IN ON 05/19/2020 FOR 6 MONTHS.  REFILL REQUEST IS EARLY.

## 2020-10-01 NOTE — Telephone Encounter (Signed)
SENT TO THE PHARMACY ON 05/20/2020 FOR 6 MONTHS.  REQUEST IS TOO EARLY.

## 2020-10-03 ENCOUNTER — Other Ambulatory Visit: Payer: Self-pay | Admitting: Adult Health

## 2020-10-03 DIAGNOSIS — G4700 Insomnia, unspecified: Secondary | ICD-10-CM

## 2020-10-03 NOTE — Telephone Encounter (Signed)
SENT IN FOR 6 MONTHS ON 05/19/2020.  REQUEST IS TOO EARLY.

## 2020-10-15 ENCOUNTER — Other Ambulatory Visit: Payer: Self-pay | Admitting: Adult Health

## 2020-10-15 DIAGNOSIS — G4700 Insomnia, unspecified: Secondary | ICD-10-CM

## 2020-10-17 ENCOUNTER — Other Ambulatory Visit: Payer: Self-pay | Admitting: Adult Health

## 2020-10-21 ENCOUNTER — Ambulatory Visit: Payer: BC Managed Care – PPO | Admitting: Adult Health

## 2020-10-21 ENCOUNTER — Other Ambulatory Visit: Payer: Self-pay

## 2020-10-21 ENCOUNTER — Encounter: Payer: Self-pay | Admitting: Adult Health

## 2020-10-21 VITALS — BP 132/86 | HR 74 | Temp 98.6°F | Ht 67.0 in | Wt 162.1 lb

## 2020-10-21 DIAGNOSIS — R4189 Other symptoms and signs involving cognitive functions and awareness: Secondary | ICD-10-CM

## 2020-10-21 NOTE — Patient Instructions (Signed)
It was great seeing you today   I have referred you to [facility] for neuropsych testing   They will call you to schedule your appointment

## 2020-10-21 NOTE — Progress Notes (Signed)
Subjective:    Patient ID: Ellen [name], female    DOB: Jun 15, 1956, 65 y.o.   MRN: [id number]  HPI 65 year old female who  has a past medical history of Alcohol abuse, Depression, Hyperlipidemia, Hypertension, and Seasonal allergies.  She presents with a close friend for this appointment.   She is concerned about worsening cognitive function.  Symptoms originally presented around September 2009, after she became sober.  Symptoms over the years have been intermittent but becoming more frequent.  She was originally seen in July 2020 at this time her MMSE was 29 out of 30.  Blood work has been stable.  She had an MRI done at this time which showed mild chronic microvascular ischemic changes.  We thought that her high-dose trazodone may be a contributing cause and she was taken off this medication.  She does report improvement since coming off trazodone but that she feels as though her cognitive impairment is are becoming more frequent and slightly worse.  Her friend who is with her at this visit corroborates this.  Symptoms include having difficulty completing simple tasks, word finding issues, getting lost well driving.  She also reports hallucinations both visual and auditory in the past.  These hallucinations included seeing and hearing people around her house.  She would like to be referred to a specialist for further evaluation  Review of Systems See HPI   Past Medical History:  Diagnosis Date  . Alcohol abuse   . Depression   . Hyperlipidemia   . Hypertension   . Seasonal allergies     Social History   Socioeconomic History  . Marital status: Married    Spouse name: Not on file  . Number of children: Not on file  . Years of education: Not on file  . Highest education level: Not on file  Occupational History  . Not on file  Tobacco Use  . Smoking status: Former Research scientist (life sciences)  . Smokeless tobacco: Never Used  Vaping Use  . Vaping Use: Never used  Substance and Sexual  Activity  . Alcohol use: Not Currently  . Drug use: Never  . Sexual activity: Not on file  Other Topics Concern  . 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 ABDOMINAL HYSTERECTOMY  1998    Family History  Problem Relation Age of Onset  . High Cholesterol Mother   . High blood pressure Mother   . Diabetes Mother   . High blood pressure Father   . High Cholesterol Father   . Diabetes Father   . High blood pressure Brother   . High Cholesterol Brother     No Known Allergies  Current Outpatient Medications on File Prior to Visit  Medication Sig Dispense Refill  . benztropine (COGENTIN) 1 MG tablet TAKE 1 TABLET BY MOUTH EVERY DAY 90 tablet 1  . ezetimibe (ZETIA) 10 MG tablet Take 1 tablet (10 mg total) by mouth daily. 90 tablet 3  . hydrochlorothiazide (HYDRODIURIL) 25 MG tablet Take 1 tablet (25  mg total) by mouth daily. 90 tablet 3  . quinapril (ACCUPRIL) 20 MG tablet Take 1 tablet (20 mg total) by mouth daily. 90 tablet 3  . rosuvastatin (CRESTOR) 40 MG tablet Take 1 tablet (40 mg total) by mouth daily. 90 tablet 3   No current facility-administered medications on file prior to visit.    BP 132/86 (BP Location: Right Arm, Patient Position: Sitting, Cuff Size: Normal)   Pulse 74   Temp 98.6 F (37 C) (Oral)   Ht 5\' 7"  (1.702 m)   Wt 162 lb 2 oz (73.5 kg)   SpO2 98%   BMI 25.39 kg/m       Objective:   Physical Exam Vitals and nursing note reviewed.  Constitutional:      Appearance: Normal appearance.  Cardiovascular:     Rate and Rhythm: Normal rate and regular  rhythm.     Pulses: Normal pulses.     Heart sounds: Normal heart sounds.  Pulmonary:     Effort: Pulmonary effort is normal.     Breath sounds: Normal breath sounds.  Musculoskeletal:        General: Normal range of motion.  Skin:    General: Skin is warm and dry.     Capillary Refill: Capillary refill takes less than 2 seconds.  Neurological:     General: No focal deficit present.     Mental Status: She is alert and oriented to person, place, and time.  Psychiatric:        Attention and Perception: Attention and perception normal.        Mood and Affect: Mood normal.        Speech: Speech normal.        Behavior: Behavior normal.        Thought Content: Thought content normal.        Cognition and Memory: Memory normal.        Judgment: Judgment normal.     Comments: Mild word finding issues         Assessment & Plan:  1. Cognitive impairment -She had a few instances of mild word finding issues today, other than that no apparent cognitive deficits.  Will refer to neuropsych for further testing.  Can consider repeat MRI or referral to neurology for management in the future.  - No driving at night  - Ambulatory referral to Neurology  Dorothyann Peng, NP

## 2020-10-30 ENCOUNTER — Ambulatory Visit: Payer: BC Managed Care – PPO | Admitting: Neurology

## 2020-10-30 ENCOUNTER — Other Ambulatory Visit (INDEPENDENT_AMBULATORY_CARE_PROVIDER_SITE_OTHER): Payer: BC Managed Care – PPO

## 2020-10-30 ENCOUNTER — Other Ambulatory Visit: Payer: Self-pay

## 2020-10-30 ENCOUNTER — Encounter: Payer: Self-pay | Admitting: Neurology

## 2020-10-30 VITALS — BP 172/97 | HR 109 | Resp 20 | Ht 67.0 in | Wt 164.0 lb

## 2020-10-30 DIAGNOSIS — F03B18 Unspecified dementia, moderate, with other behavioral disturbance: Secondary | ICD-10-CM

## 2020-10-30 DIAGNOSIS — G3109 Other frontotemporal dementia: Secondary | ICD-10-CM

## 2020-10-30 DIAGNOSIS — F0280 Dementia in other diseases classified elsewhere without behavioral disturbance: Secondary | ICD-10-CM | POA: Diagnosis not present

## 2020-10-30 DIAGNOSIS — F0391 Unspecified dementia with behavioral disturbance: Secondary | ICD-10-CM

## 2020-10-30 LAB — AMMONIA: Ammonia: 22 umol/L (ref 11–35)

## 2020-10-30 LAB — SEDIMENTATION RATE: Sed Rate: 19 mm/hr (ref 0–30)

## 2020-10-30 LAB — C-REACTIVE PROTEIN: CRP: <1 mg/dL (ref 0.5–20.0)

## 2020-10-30 MED ORDER — DONEPEZIL HCL 10 MG PO TABS: ORAL_TABLET | ORAL | 11 refills | Status: DC
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
## 2020-10-30 NOTE — Patient Instructions (Addendum)
1. Bloodwork for RPR, ammonia, ESR, CRP, ANA, urinalysis  2. Schedule MRI brain with and without contrast  3. Start Donepezil 24m: Take 1/2 tablet daily for 2 weeks, then increase to 1 tablet daily  4. Call for an update in a month, if no issues with medication, we will plan to start a medication for mood called Lexapro  5. Recommend [place] moving forward  6. No further driving  7. Follow-up in 3 months, call for any changes   FALL PRECAUTIONS: Be cautious when walking. Scan the area for obstacles that may increase the risk of trips and falls. When getting up in the mornings, sit up at the edge of the bed for a few minutes before getting out of bed. Consider elevating the bed at the head end to avoid drop of blood pressure when getting up. Walk always in a well-lit room (use night lights in the walls). Avoid area rugs or power cords from appliances in the middle of the walkways. Use a walker or a cane if necessary and consider physical therapy for balance exercise. Get your eyesight checked regularly.  FINANCIAL OVERSIGHT: Supervision, especially oversight when making financial decisions or transactions is also recommended.  HOME SAFETY: Consider the safety of the kitchen when operating appliances like stoves, microwave oven, and blender. Consider having supervision and share cooking responsibilities until no longer able to participate in those. Accidents with firearms and other hazards in the house should be identified and addressed as well.  DRIVING: Regarding driving, in patients with progressive memory problems, driving will be impaired. We advise to have someone else do the driving if trouble finding directions or if minor accidents are reported.  ABILITY TO BE LEFT ALONE: If patient is unable to contact 911 operator, consider using LifeLine, or when the need is there, arrange for someone to stay with patients. Smoking is a fire hazard, consider supervision  or cessation. Risk of wandering should be assessed by caregiver and if detected at any point, supervision and safe proof recommendations should be instituted.  MEDICATION SUPERVISION: Inability to self-administer medication needs to be constantly addressed. Implement a mechanism to ensure safe administration of the medications.  RECOMMENDATIONS FOR ALL PATIENTS WITH MEMORY PROBLEMS: 1. Continue to exercise (Recommend 30 minutes of walking everyday, or 3 hours every week) 2. Increase social interactions - continue going to [place] social gatherings with friends and family 3. Eat healthy, avoid fried foods and eat more fruits and vegetables 4. Maintain adequate blood pressure, blood sugar, and blood cholesterol level. Reducing the risk of stroke and cardiovascular disease also helps promoting better memory. 5. Avoid stressful situations. Live a simple life and avoid aggravations. Organize your time and prepare for the next day in anticipation. 6. Sleep well, avoid any interruptions of sleep and avoid any distractions in the bedroom that may interfere with adequate sleep quality 7. Avoid sugar, avoid sweets as there is a strong link between excessive sugar intake, diabetes, and cognitive impairment We discussed the Mediterranean diet, which has been shown to help patients reduce the risk of progressive memory disorders and reduces cardiovascular risk. This includes eating fish, eat fruits and green leafy vegetables, nuts like almonds and hazelnuts, walnuts, and also use olive oil. Avoid fast foods and fried foods as much as possible. Avoid sweets and sugar as sugar use has been linked to worsening of memory function.  There is always a concern of gradual progression of memory problems. If this is the case, then  we may need to adjust level of care according to patient needs. Support, both to the patient and caregiver, should then be put into place.   We have sent a referral to [facility] for your MRI and they will call you directly to schedule your appointment. They are located at [place]. If you need to contact them directly please call [phone].  Your provider has requested that you have labwork completed today. Please go to [facility] ([id number]) on the second floor of this building before leaving the office today. You do not need to check in. If you are not called within 15 minutes please check with the front desk.

## 2020-10-31 LAB — RPR: RPR Ser Ql: NONREACTIVE

## 2020-10-31 LAB — ANA: Anti Nuclear Antibody (ANA): NEGATIVE

## 2020-11-03 ENCOUNTER — Telehealth: Payer: Self-pay

## 2020-11-03 NOTE — Telephone Encounter (Signed)
Pt spouse advised of pt lab results. And urine sample pt has to come back to give sample.

## 2020-11-03 NOTE — Telephone Encounter (Signed)
-----   Message from Cameron Sprang, MD sent at 11/03/2020 12:33 PM EDT ----- Pls let significant other know the bloodwork was normal. There is a urinalysis order but I don't see results, was she able to do it? Thanks

## 2020-11-07 ENCOUNTER — Other Ambulatory Visit: Payer: Self-pay | Admitting: Adult Health

## 2020-11-07 DIAGNOSIS — R251 Tremor, unspecified: Secondary | ICD-10-CM

## 2020-11-13 ENCOUNTER — Other Ambulatory Visit: Payer: Self-pay | Admitting: Adult Health

## 2020-11-13 DIAGNOSIS — R251 Tremor, unspecified: Secondary | ICD-10-CM

## 2020-11-13 MED ORDER — EZETIMIBE 10 MG PO TABS: 10.0000 mg | ORAL_TABLET | Freq: Every day | ORAL | 0 refills | Status: DC

## 2020-11-13 MED ORDER — ROSUVASTATIN CALCIUM 40 MG PO TABS: 40.0000 mg | ORAL_TABLET | Freq: Every day | ORAL | 0 refills | Status: DC

## 2020-11-13 MED ORDER — HYDROCHLOROTHIAZIDE 25 MG PO TABS: 25.0000 mg | ORAL_TABLET | Freq: Every day | ORAL | 0 refills | Status: DC

## 2020-11-13 MED ORDER — QUINAPRIL HCL 20 MG PO TABS: 20.0000 mg | ORAL_TABLET | Freq: Every day | ORAL | 0 refills | Status: DC

## 2020-11-13 NOTE — Telephone Encounter (Signed)
ezetimibe (ZETIA) 10 MG tablet  hydrochlorothiazide (HYDRODIURIL) 25 MG tablet  rosuvastatin (CRESTOR) 40 MG tablet   [id number] - [place], [place] - [address] Phone:  [phone]  Fax:  [phone]

## 2020-11-13 NOTE — Telephone Encounter (Signed)
Rx done. 

## 2020-11-20 ENCOUNTER — Other Ambulatory Visit: Payer: Self-pay

## 2020-11-20 ENCOUNTER — Ambulatory Visit
Admission: RE | Admit: 2020-11-20 | Discharge: 2020-11-20 | Disposition: A | Discharge status: Home / Self Care | Payer: Medicare Other | Source: Ambulatory Visit | Attending: Neurology | Admitting: Neurology

## 2020-11-20 MED ORDER — GADOBENATE DIMEGLUMINE 529 MG/ML IV SOLN
15.0000 mL | Freq: Once | INTRAVENOUS | Status: AC | PRN
  Administered 2020-11-20: 15 mL via INTRAVENOUS

## 2020-11-21 ENCOUNTER — Other Ambulatory Visit: Payer: Self-pay

## 2020-11-21 ENCOUNTER — Telehealth: Payer: Self-pay | Admitting: Neurology

## 2020-11-21 DIAGNOSIS — F03B18 Unspecified dementia, moderate, with other behavioral disturbance: Secondary | ICD-10-CM

## 2020-11-21 DIAGNOSIS — F0391 Unspecified dementia with behavioral disturbance: Secondary | ICD-10-CM

## 2020-11-21 NOTE — Telephone Encounter (Signed)
Patient called in and left a message wanting to get results for her MRI

## 2020-11-21 NOTE — Telephone Encounter (Signed)
Patient called in and left a message returning Christy's call

## 2020-11-21 NOTE — Telephone Encounter (Signed)
Line busy at 1150 11/21/2020

## 2020-11-24 ENCOUNTER — Other Ambulatory Visit (INDEPENDENT_AMBULATORY_CARE_PROVIDER_SITE_OTHER): Payer: Medicare Other

## 2020-11-24 ENCOUNTER — Other Ambulatory Visit: Payer: Self-pay

## 2020-11-24 DIAGNOSIS — F0391 Unspecified dementia with behavioral disturbance: Secondary | ICD-10-CM

## 2020-11-24 DIAGNOSIS — F03B18 Unspecified dementia, moderate, with other behavioral disturbance: Secondary | ICD-10-CM

## 2020-11-24 LAB — FOLATE: Folate: 10.6 ng/mL (ref >5.9)

## 2020-11-28 LAB — VITAMIN B1: Vitamin B1 (Thiamine): 9 nmol/L (ref 8–30)

## 2020-12-01 ENCOUNTER — Telehealth: Payer: Self-pay | Admitting: Neurology

## 2020-12-01 ENCOUNTER — Other Ambulatory Visit: Payer: Self-pay

## 2020-12-01 MED ORDER — ESCITALOPRAM OXALATE 10 MG PO TABS: 10.0000 mg | ORAL_TABLET | Freq: Every day | ORAL | 6 refills | Status: DC

## 2020-12-01 NOTE — Telephone Encounter (Signed)
Pt called and informed that we Would start one medication at a time, see how she does on Lexapro first. Can try melatonin to help with sleep for now

## 2020-12-01 NOTE — Telephone Encounter (Signed)
-----   Message from Cameron Sprang, MD sent at 11/29/2020  3:18 PM EDT ----- Pls let her spouse know the B1 (thiamine) level was low normal, would recommend starting Thiamine 100mg  daily supplement, thanks

## 2020-12-01 NOTE — Telephone Encounter (Signed)
Patient called to let the doctor know she's been doing fine on her existing medications and she'd like to start Lexapro as previously discussed.  Ellen [name]

## 2020-12-01 NOTE — Telephone Encounter (Signed)
Ok to send Rx for Lexapro 10mg  daily with 6 refills, thanks

## 2020-12-01 NOTE — Telephone Encounter (Signed)
Pt called and informed that B1 (thiamine) level was low normal, would recommend starting Thiamine 100mg  daily supplement. Lexapro 10 mg daily sent to pharmacy. Asking for something to help her sleep? Something light nothing strong trazodone is to strong.  Asking if low B1 level is causing her symptoms?

## 2020-12-17 ENCOUNTER — Encounter: Payer: Self-pay | Admitting: Adult Health

## 2020-12-18 ENCOUNTER — Telehealth (INDEPENDENT_AMBULATORY_CARE_PROVIDER_SITE_OTHER): Payer: BC Managed Care – PPO | Admitting: Adult Health

## 2020-12-18 ENCOUNTER — Telehealth: Payer: Medicare Other | Admitting: Adult Health

## 2020-12-18 ENCOUNTER — Encounter: Payer: Self-pay | Admitting: Adult Health

## 2020-12-18 VITALS — Wt 150.0 lb

## 2020-12-18 DIAGNOSIS — R251 Tremor, unspecified: Secondary | ICD-10-CM | POA: Diagnosis not present

## 2020-12-18 DIAGNOSIS — F419 Anxiety disorder, unspecified: Secondary | ICD-10-CM

## 2020-12-18 DIAGNOSIS — E782 Mixed hyperlipidemia: Secondary | ICD-10-CM

## 2020-12-18 DIAGNOSIS — G4700 Insomnia, unspecified: Secondary | ICD-10-CM

## 2020-12-18 DIAGNOSIS — F32A Depression, unspecified: Secondary | ICD-10-CM

## 2020-12-18 DIAGNOSIS — I10 Essential (primary) hypertension: Secondary | ICD-10-CM | POA: Diagnosis not present

## 2020-12-18 NOTE — Progress Notes (Signed)
Virtual Visit via Video Note  I connected with Ellen [name] on 12/18/20 at 11:30 AM EDT by a video enabled telemedicine application and verified that I am speaking with the correct person using two identifiers.  Location patient: home Location provider:work or home office Persons participating in the virtual visit: patient, provider  I discussed the limitations of evaluation and management by telemedicine and the availability of in person appointments. The patient expressed understanding and agreed to proceed.   HPI: 65 year old female who was recently diagnosed with dementia.  She is trying to get service-connected through the [place] and needs me to write a letter on her behalf documenting treatments that I have provided for her for her various chronic conditions.   ROS: See pertinent positives and negatives per HPI.  Past Medical History:  Diagnosis Date  . Alcohol abuse   . Depression   . Hyperlipidemia   . Hypertension   . Seasonal allergies     Past Surgical History:  Procedure Laterality Date  . ABDOMINAL HYSTERECTOMY  1998    Family History  Problem Relation Age of Onset  . High Cholesterol Mother   . High blood pressure Mother   . Diabetes Mother   . High blood pressure Father   . High Cholesterol Father   . Diabetes Father   . High blood pressure Brother   . High Cholesterol Brother        Current Outpatient Medications:  .  benztropine (COGENTIN) 1 MG tablet, TAKE 1 TABLET BY MOUTH EVERY DAY, Disp: 90 tablet, Rfl: 1 .  donepezil (ARICEPT) 10 MG tablet, Take 1/2 tablet daily for 2 weeks, then increase to 1 tablet daily (Patient taking differently: Take 10 mg by mouth daily.), Disp: 30 tablet, Rfl: 11 .  escitalopram (LEXAPRO) 10 MG tablet, Take 1 tablet (10 mg total) by mouth daily., Disp: 30 tablet, Rfl: 6 .  ezetimibe (ZETIA) 10 MG tablet, Take 1 tablet (10 mg total) by mouth daily., Disp: 90 tablet, Rfl: 0 .  hydrochlorothiazide (HYDRODIURIL) 25 MG tablet,  Take 1 tablet (25 mg total) by mouth daily., Disp: 90 tablet, Rfl: 0 .  quinapril (ACCUPRIL) 20 MG tablet, Take 1 tablet (20 mg total) by mouth daily., Disp: 90 tablet, Rfl: 0 .  rosuvastatin (CRESTOR) 40 MG tablet, Take 1 tablet (40 mg total) by mouth daily., Disp: 90 tablet, Rfl: 0  EXAM:  VITALS per patient if applicable:  GENERAL: alert, oriented, appears well and in no acute distress  HEENT: atraumatic, conjunttiva clear, no obvious abnormalities on inspection of external nose and ears  NECK: normal movements of the head and neck  LUNGS: on inspection no signs of respiratory distress, breathing rate appears normal, no obvious gross SOB, gasping or wheezing  CV: no obvious cyanosis  MS: moves all visible extremities without noticeable abnormality  PSYCH/NEURO: pleasant and cooperative, no obvious depression or anxiety, speech and thought processing grossly intact  ASSESSMENT AND PLAN:  Discussed the following assessment and plan:  Letter will be placed in her MyChart detailing the chronic conditions that I have treated Ellen [name] for.      I discussed the assessment and treatment plan with the patient. The patient was provided an opportunity to ask questions and all were answered. The patient agreed with the plan and demonstrated an understanding of the instructions.   The patient was advised to call back or seek an in-person evaluation if the symptoms worsen or if the condition fails to improve as anticipated.   Tommi Rumps  Sir Mallis, NP

## 2020-12-23 ENCOUNTER — Other Ambulatory Visit: Payer: Self-pay | Admitting: Neurology

## 2020-12-30 ENCOUNTER — Other Ambulatory Visit: Payer: Self-pay

## 2020-12-30 DIAGNOSIS — F0391 Unspecified dementia with behavioral disturbance: Secondary | ICD-10-CM

## 2020-12-30 DIAGNOSIS — F03B18 Unspecified dementia, moderate, with other behavioral disturbance: Secondary | ICD-10-CM

## 2021-01-18 ENCOUNTER — Other Ambulatory Visit: Payer: Self-pay

## 2021-01-18 ENCOUNTER — Emergency Department (HOSPITAL_COMMUNITY)
Admission: EM | Admit: 2021-01-18 | Discharge: 2021-01-22 | Disposition: A | Discharge status: Home / Self Care | Payer: Medicare Other | Attending: Emergency Medicine | Admitting: Emergency Medicine

## 2021-01-18 DIAGNOSIS — Z20822 Contact with and (suspected) exposure to covid-19: Secondary | ICD-10-CM | POA: Insufficient documentation

## 2021-01-18 DIAGNOSIS — Z743 Need for continuous supervision: Secondary | ICD-10-CM | POA: Diagnosis not present

## 2021-01-18 DIAGNOSIS — R6889 Other general symptoms and signs: Secondary | ICD-10-CM | POA: Diagnosis not present

## 2021-01-18 DIAGNOSIS — I10 Essential (primary) hypertension: Secondary | ICD-10-CM | POA: Diagnosis not present

## 2021-01-18 DIAGNOSIS — Y900 Blood alcohol level of less than 20 mg/100 ml: Secondary | ICD-10-CM | POA: Diagnosis not present

## 2021-01-18 DIAGNOSIS — Z87891 Personal history of nicotine dependence: Secondary | ICD-10-CM | POA: Insufficient documentation

## 2021-01-18 DIAGNOSIS — T43292A Poisoning by other antidepressants, intentional self-harm, initial encounter: Secondary | ICD-10-CM | POA: Insufficient documentation

## 2021-01-18 DIAGNOSIS — Z79899 Other long term (current) drug therapy: Secondary | ICD-10-CM | POA: Insufficient documentation

## 2021-01-18 DIAGNOSIS — F32A Depression, unspecified: Secondary | ICD-10-CM | POA: Diagnosis not present

## 2021-01-18 DIAGNOSIS — Z046 Encounter for general psychiatric examination, requested by authority: Secondary | ICD-10-CM | POA: Diagnosis not present

## 2021-01-18 DIAGNOSIS — R0902 Hypoxemia: Secondary | ICD-10-CM | POA: Diagnosis not present

## 2021-01-18 DIAGNOSIS — T50902A Poisoning by unspecified drugs, medicaments and biological substances, intentional self-harm, initial encounter: Secondary | ICD-10-CM

## 2021-01-18 DIAGNOSIS — T887XXA Unspecified adverse effect of drug or medicament, initial encounter: Secondary | ICD-10-CM | POA: Diagnosis not present

## 2021-01-18 DIAGNOSIS — T50904A Poisoning by unspecified drugs, medicaments and biological substances, undetermined, initial encounter: Secondary | ICD-10-CM | POA: Diagnosis not present

## 2021-01-18 DIAGNOSIS — F332 Major depressive disorder, recurrent severe without psychotic features: Secondary | ICD-10-CM | POA: Insufficient documentation

## 2021-01-18 DIAGNOSIS — J9811 Atelectasis: Secondary | ICD-10-CM | POA: Diagnosis not present

## 2021-01-18 LAB — COMPREHENSIVE METABOLIC PANEL
ALT: 23 U/L (ref 0–44)
AST: 33 U/L (ref 15–41)
Albumin: 4.1 g/dL (ref 3.5–5.0)
Alkaline Phosphatase: 80 U/L (ref 38–126)
Anion gap: 8 (ref 5–15)
BUN: 14 mg/dL (ref 8–23)
CO2: 31 mmol/L (ref 22–32)
Calcium: 9.2 mg/dL (ref 8.9–10.3)
Chloride: 97 mmol/L — ABNORMAL LOW (ref 98–111)
Creatinine, Ser: 1.19 mg/dL — ABNORMAL HIGH (ref 0.44–1.00)
GFR, Estimated: 51 mL/min — ABNORMAL LOW (ref >60)
Glucose, Bld: 114 mg/dL — ABNORMAL HIGH (ref 70–99)
Potassium: 4 mmol/L (ref 3.5–5.1)
Sodium: 136 mmol/L (ref 135–145)
Total Bilirubin: 1.2 mg/dL (ref 0.3–1.2)
Total Protein: 7.8 g/dL (ref 6.5–8.1)

## 2021-01-18 LAB — RESP PANEL BY RT-PCR (FLU A&B, COVID) ARPGX2
Influenza A by PCR: NEGATIVE
Influenza B by PCR: NEGATIVE
SARS Coronavirus 2 by RT PCR: NEGATIVE

## 2021-01-18 LAB — CBC WITH DIFFERENTIAL/PLATELET
Abs Immature Granulocytes: 0.02 10*3/uL (ref 0.00–0.07)
Basophils Absolute: 0 10*3/uL (ref 0.0–0.1)
Basophils Relative: 0 %
Eosinophils Absolute: 0 10*3/uL (ref 0.0–0.5)
Eosinophils Relative: 0 %
HCT: 40.3 % (ref 36.0–46.0)
Hemoglobin: 13.4 g/dL (ref 12.0–15.0)
Immature Granulocytes: 0 %
Lymphocytes Relative: 13 %
Lymphs Abs: 1.1 10*3/uL (ref 0.7–4.0)
MCH: 31.3 pg (ref 26.0–34.0)
MCHC: 33.3 g/dL (ref 30.0–36.0)
MCV: 94.2 fL (ref 80.0–100.0)
Monocytes Absolute: 0.5 10*3/uL (ref 0.1–1.0)
Monocytes Relative: 6 %
Neutro Abs: 6.8 10*3/uL (ref 1.7–7.7)
Neutrophils Relative %: 81 %
Platelets: 196 10*3/uL (ref 150–400)
RBC: 4.28 MIL/uL (ref 3.87–5.11)
RDW: 12.5 % (ref 11.5–15.5)
WBC: 8.5 10*3/uL (ref 4.0–10.5)
nRBC: 0 % (ref 0.0–0.2)

## 2021-01-18 LAB — RAPID URINE DRUG SCREEN, HOSP PERFORMED
Amphetamines: POSITIVE — AB
Barbiturates: NOT DETECTED
Benzodiazepines: NOT DETECTED
Cocaine: POSITIVE — AB
Opiates: NOT DETECTED
Tetrahydrocannabinol: NOT DETECTED

## 2021-01-18 LAB — CK: Total CK: 147 U/L (ref 38–234)

## 2021-01-18 LAB — MAGNESIUM
Magnesium: 2.2 mg/dL (ref 1.7–2.4)
Magnesium: 1.9 mg/dL (ref 1.7–2.4)

## 2021-01-18 LAB — SALICYLATE LEVEL: Salicylate Lvl: <7 mg/dL — ABNORMAL LOW (ref 7.0–30.0)

## 2021-01-18 LAB — POTASSIUM: Potassium: 3.5 mmol/L (ref 3.5–5.1)

## 2021-01-18 LAB — LIPASE, BLOOD: Lipase: 25 U/L (ref 11–51)

## 2021-01-18 LAB — ACETAMINOPHEN LEVEL: Acetaminophen (Tylenol), Serum: <10 ug/mL — ABNORMAL LOW (ref 10–30)

## 2021-01-18 LAB — ETHANOL: Alcohol, Ethyl (B): <10 mg/dL (ref <10)

## 2021-01-18 MED ORDER — ROSUVASTATIN CALCIUM 20 MG PO TABS
40.0000 mg | ORAL_TABLET | Freq: Every day | ORAL | Status: DC
  Administered 2021-01-18 – 2021-01-22 (×5): 40 mg via ORAL
  Filled 2021-01-18 (×5): qty 2

## 2021-01-18 MED ORDER — HYDROCHLOROTHIAZIDE 25 MG PO TABS
25.0000 mg | ORAL_TABLET | Freq: Every day | ORAL | Status: DC
  Administered 2021-01-18 – 2021-01-22 (×5): 25 mg via ORAL
  Filled 2021-01-18 (×5): qty 1

## 2021-01-18 MED ORDER — QUINAPRIL HCL 10 MG PO TABS: 20.0000 mg | ORAL_TABLET | Freq: Every day | ORAL | Status: DC

## 2021-01-18 MED ORDER — EZETIMIBE 10 MG PO TABS
10.0000 mg | ORAL_TABLET | Freq: Every day | ORAL | Status: DC
  Administered 2021-01-18 – 2021-01-22 (×5): 10 mg via ORAL
  Filled 2021-01-18 (×5): qty 1

## 2021-01-18 MED ORDER — LISINOPRIL 20 MG PO TABS
20.0000 mg | ORAL_TABLET | Freq: Every day | ORAL | Status: DC
  Administered 2021-01-18 – 2021-01-22 (×5): 20 mg via ORAL
  Filled 2021-01-18 (×5): qty 1

## 2021-01-18 MED ORDER — LACTATED RINGERS IV BOLUS
1000.0000 mL | Freq: Once | INTRAVENOUS | Status: AC
  Administered 2021-01-18: 1000 mL via INTRAVENOUS

## 2021-01-18 MED ORDER — LACTATED RINGERS IV SOLN: INTRAVENOUS | Status: DC

## 2021-01-18 NOTE — ED Notes (Signed)
Family at bedside. 

## 2021-01-18 NOTE — BH Assessment (Signed)
Comprehensive Clinical Assessment (CCA) Note  01/18/2021 Ellen [name] [id number]  DISPOSITION: Gave clinical report to [name], NP who determined Pt meets criteria for inpatient geriatric-psychiatry treatment. Appropriate facilities will be contacted for placement. Notified Dr. Lacretia Leigh and Harlene Salts, RN via secure chat of recommendation.  The patient demonstrates the following risk factors for suicide: Chronic risk factors for suicide include: psychiatric disorder of major depressive disorder. Acute risk factors for suicide include: family or marital conflict and social withdrawal/isolation. Protective factors for this patient include: positive social support. Considering these factors, the overall suicide risk at this point appears to be high. Patient is not appropriate for outpatient follow up.  [place] ED from 01/18/2021 in [place] DEPT  C-SSRS RISK CATEGORY High Risk     Pt is a 65 year old female who presents to [facility] ED accompanied by her significant other, Ellen [name] [phone], who participated in assessment at Pt's request. Pt says she has felt very depressed recently, stating "There are a lot of things going on in my world." Pt reports she has a history of depression and has been severely depressed for the past two weeks. Pt acknowledges that she ingested two handfuls of Trazodone yesterday in a suicide attempt. Pt attempted to take more pills today but experienced emesis. She denies any history of previous suicide attempts. Pt describes her mood recently as "unhappy." Pt acknowledges symptoms including crying spells, social withdrawal, loss of interest in usual pleasures, fatigue, irritability, decreased concentration, decreased sleep, decreased appetite and feelings of worthlessness and hopelessness. Pt denies any history of intentional self-injurious behaviors. Pt denies current homicidal ideation or history of  violence. Pt denies current auditory or visual hallucinations. Pt states she experienced hallucinations a few months ago of believing that people were running through the house. Pt reports she has a history of alcohol use but since going to a 30-day treatment center in 2019 has not drank alcohol. She denies any other substance use.  Pt has difficulty expressing her stressors. [name] has been diagnosed with dementia. She says in March Pt's neurologist prescribed Lexapro and Aricept and that Pt's symptoms improved. She says Pt was also prescribed Trazodone but that medication made Pt lethargic and sleep excessively. [name] says two weeks ago Pt's granddaughter came to live with them. She says this disrupted Pt's routine and that is when she noticed Pt's mood was more depressed.   Pt says she has three adult children, one she has a good relationship and two that are estranged. Pt has no history of abuse or trauma. Pt denies legal problems. She denies access to firearms. [name] says Pt will see her neurologist but has been reluctant to see a psychiatrist. Pt went to a 30-day substance abuse treatment program in [place] in 2019 but otherwise has not had any inpatient mental health treatment.  Pt is covered by a blanket, alert and oriented x4. Pt speaks in a soft tone, at moderate volume and slow pace. Motor behavior appears normal. Eye contact is good. Pt's mood is depressed and affect is congruent with mood. Thought process is coherent and relevant. Pt's concentration appears impaired and Pt says she has difficulty focusing because of the medication overdose. There is no indication Pt is currently responding to internal stimuli or experiencing delusional thought content. Pt was cooperative throughout assessment. She says she does not want to be psychiatrically hospitalized and would prefer to return home and see a mental health  professional on an outpatient basis.   Chief Complaint: No chief  complaint on file.  Visit Diagnosis: F33.2 Major depressive disorder, Recurrent episode, Severe    CCA Screening, Triage and Referral (STR)  Patient Reported Information How did you hear about [place]? No data recorded Referral name: No data recorded Referral phone number: No data recorded  Whom do you see for routine medical problems? No data recorded Practice/Facility Name: No data recorded Practice/Facility Phone Number: No data recorded Name of Contact: No data recorded Contact Number: No data recorded Contact Fax Number: No data recorded Prescriber Name: No data recorded Prescriber Address (if known): No data recorded  What Is the Reason for Your Visit/Call Today? No data recorded How Long Has This Been Causing You Problems? No data recorded What Do You Feel Would Help You the Most Today? No data recorded  Have You Recently Been in Any Inpatient Treatment (Hospital/Detox/Crisis Center/28-Day Program)? No data recorded Name/Location of Program/Hospital:No data recorded How Long Were You There? No data recorded When Were You Discharged? No data recorded  Have You Ever Received Services From [facility] Before? No data recorded Who Do You See at [facility]? No data recorded  Have You Recently Had Any Thoughts About Hurting Yourself? No data recorded Are You Planning to Commit Suicide/Harm Yourself At This time? No data recorded  Have you Recently Had Thoughts About [place]? No data recorded Explanation: No data recorded  Have You Used Any Alcohol or Drugs in the Past 24 Hours? No data recorded How Long Ago Did You Use Drugs or Alcohol? No data recorded What Did You Use and How Much? No data recorded  Do You Currently Have a Therapist/Psychiatrist? No data recorded Name of Therapist/Psychiatrist: No data recorded  Have You Been Recently Discharged From Any Office Practice or Programs? No data recorded Explanation of Discharge From Practice/Program: No data  recorded    CCA Screening Triage Referral Assessment Type of Contact: No data recorded Is this Initial or Reassessment? No data recorded Date Telepsych consult ordered in [facility]:  No data recorded Time Telepsych consult ordered in [facility]:  No data recorded  Patient Reported Information Reviewed? No data recorded Patient Left Without Being Seen? No data recorded Reason for Not Completing Assessment: No data recorded  Collateral Involvement: No data recorded  Does Patient Have a [place]? No data recorded Name and Contact of Legal Guardian: No data recorded If Minor and Not Living with Parent(s), Who has Custody? No data recorded Is CPS involved or ever been involved? No data recorded Is APS involved or ever been involved? No data recorded  Patient Determined To Be At Risk for Harm To Self or Others Based on Review of Patient Reported Information or Presenting Complaint? No data recorded Method: No data recorded Availability of Means: No data recorded Intent: No data recorded Notification Required: No data recorded Additional Information for Danger to Others Potential: No data recorded Additional Comments for Danger to Others Potential: No data recorded Are There Guns or Other Weapons in Your Home? No data recorded Types of Guns/Weapons: No data recorded Are These Weapons Safely Secured?                            No data recorded Who Could Verify You Are Able To Have These Secured: No data recorded Do You Have any Outstanding Charges, Pending Court Dates, Parole/Probation? No data recorded Contacted To Inform of Risk  of Harm To Self or Others: No data recorded  Location of Assessment: No data recorded  Does Patient Present under Involuntary Commitment? No data recorded IVC Papers Initial File Date: No data recorded  [place] of Residence: No data recorded  Patient Currently Receiving the Following Services: No data recorded  Determination of Need: No data  recorded  Options For Referral: No data recorded    CCA Biopsychosocial Intake/Chief Complaint:  Pt reports ingesting two handfuls of Trazodone in a suicide attempt.  Current Symptoms/Problems: Pt reports depressive symptoms, anxiety, suicidal ideation.   Patient Reported Schizophrenia/Schizoaffective Diagnosis in Past: No   Strengths: Pt has supportive partner  Preferences: Pt prefers outpatient treatment.  Abilities: Pt reports she exercises regularly   Type of Services Patient Feels are Needed: Outpatient mental health treatment   Initial Clinical Notes/Concerns: NA   Mental Health Symptoms Depression:  Change in energy/activity; Difficulty Concentrating; Fatigue; Hopelessness; Increase/decrease in appetite; Irritability; Sleep (too much or little); Tearfulness; Worthlessness   Duration of Depressive symptoms: Greater than two weeks   Mania:  Irritability; Change in energy/activity   Anxiety:   Difficulty concentrating; Fatigue; Irritability; Restlessness; Sleep; Tension; Worrying   Psychosis:  None (Pt denies current hallucinations.)   Duration of Psychotic symptoms: Greater than six months   Trauma:  None   Obsessions:  None   Compulsions:  None   Inattention:  N/A   Hyperactivity/Impulsivity:  N/A   Oppositional/Defiant Behaviors:  N/A   Emotional Irregularity:  None   Other Mood/Personality Symptoms:  NA    Mental Status Exam Appearance and self-care  Stature:  Average   Weight:  Average weight   Clothing:  -- (Covered by blanket)   Grooming:  Normal   Cosmetic use:  Age appropriate   Posture/gait:  Normal   Motor activity:  Not Remarkable   Sensorium  Attention:  Distractible   Concentration:  Anxiety interferes; Variable   Orientation:  X5   Recall/memory:  Defective in Short-term   Affect and Mood  Affect:  Depressed; Appropriate   Mood:  Depressed   Relating  Eye contact:  Normal   Facial expression:  Depressed    Attitude toward examiner:  Cooperative   Thought and Language  Speech flow: Soft; Slow   Thought content:  Appropriate to Mood and Circumstances   Preoccupation:  None   Hallucinations:  None   Organization:  No data recorded  [employer] of Knowledge:  Average   Intelligence:  Average   Abstraction:  Normal   Judgement:  Impaired   Reality Testing:  Adequate   Insight:  Gaps   Decision Making:  Environmental manager   Social Functioning  Social Maturity:  Responsible   Social Judgement:  Normal   Stress  Stressors:  Family conflict   Coping Ability:  Programme researcher, broadcasting/film/video Deficits:  None   Supports:  Family; Friends/Service system     Religion: Religion/Spirituality Are You A Religious Person?: No How Might This Affect Treatment?: NA  Leisure/Recreation: Leisure / Recreation Do You Have Hobbies?: Yes Leisure and Hobbies: Exercising  Exercise/Diet: Exercise/Diet Do You Exercise?: Yes What Type of Exercise Do You Do?: Run/Walk,Weight Training How Many Times a Week Do You Exercise?: 1-3 times a week Have You Gained or Lost A Significant Amount of Weight in the Past Six Months?: No Do You Follow a Special Diet?: No Do You Have Any Trouble Sleeping?: No   CCA Employment/Education Employment/Work Situation: Employment / Work Situation 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 by current illness: No What is the longest time patient has a held a job?: 18 years Where was the patient employed at that time?: [employer] Has patient ever been in the [employer]?: Yes (Describe in comment) Education officer, community)  Education: Education Is Patient Currently Attending School?: No Did Teacher, adult education From [employer]?: Yes Did Physicist, medical?: Yes What Type of College Degree Do you Have?: Associate's degree Did You Attend Graduate School?: No Did You Have An Individualized Education Program (IIEP): No Did You Have Any Difficulty At School?: No Patient's  Education Has Been Impacted by Current Illness: No   CCA Family/Childhood History Family and Relationship History: Family history Marital status: Long term relationship Long term relationship, how long?: Many years What types of issues is patient dealing with in the relationship?: None Additional relationship information: Partner's name is Ellen [name] Are you sexually active?: Yes Has your sexual activity been affected by drugs, alcohol, medication, or emotional stress?: No Does patient have children?: Yes How many children?: 3 How is patient's relationship with their children?: Good relationship with daughter, estranged from 2 other children  Childhood History:  Childhood History By whom was/is the patient raised?: Mother Additional childhood history information: Pt says her father was somewhat involved. Description of patient's relationship with caregiver when they were a child: Good relationship Patient's description of current relationship with people who raised him/her: Parents are deceased How were you disciplined when you got in trouble as a child/adolescent?: Mild discipline Does patient have siblings?: Yes Number of Siblings: 1 Description of patient's current relationship with siblings: Pt says she has one brother Did patient suffer any verbal/emotional/physical/sexual abuse as a child?: No Did patient suffer from severe childhood neglect?: No Has patient ever been sexually abused/assaulted/raped as an adolescent or adult?: No Was the patient ever a victim of a crime or a disaster?: No Witnessed domestic violence?: No Has patient been affected by domestic violence as an adult?: No  Child/Adolescent Assessment:     CCA Substance Use Alcohol/Drug Use: Alcohol / Drug Use Pain Medications: Denies abuse Prescriptions: Denies abuse Over the Counter: Denies abuse History of alcohol / drug use?: Yes Longest period of sobriety (when/how long): Pt reports she has not  drank alcohol in 3 years.                         ASAM's:  Six Dimensions of Multidimensional Assessment  Dimension 1:  Acute Intoxication and/or Withdrawal Potential:      Dimension 2:  Biomedical Conditions and Complications:      Dimension 3:  Emotional, Behavioral, or Cognitive Conditions and Complications:     Dimension 4:  Readiness to Change:     Dimension 5:  Relapse, Continued use, or Continued Problem Potential:     Dimension 6:  Recovery/Living Environment:     ASAM Severity Score:    ASAM Recommended Level of Treatment:     Substance use Disorder (SUD)    Recommendations for Services/Supports/Treatments:    DSM5 Diagnoses: Patient Active Problem List   Diagnosis Date Noted  . Tremors of nervous system 03/12/2020  . Insomnia 03/12/2020  . Hypertension   . Hyperlipidemia   . Anxiety and depression   . Alcohol abuse     Patient Centered Plan: Patient is on the following Treatment Plan(s):  Depression   Referrals to Alternative Service(s): Referred to Alternative Service(s):   Place:   Date:   Time:    Referred  to Alternative Service(s):   Place:   Date:   Time:    Referred to Alternative Service(s):   Place:   Date:   Time:    Referred to Alternative Service(s):   Place:   Date:   Time:     Evelena Peat, [facility]

## 2021-01-18 NOTE — ED Notes (Signed)
Significant other is supportive and at the bedside.

## 2021-01-18 NOTE — ED Notes (Signed)
QTc on EKG done at 2149 is 534 (down from 626 on the EKG done at 18:26). Will recheck EKG at 0200 to check for QTc again

## 2021-01-18 NOTE — ED Notes (Addendum)
Called Poison Control. They recommend EKG and keep on monitor, tylenol, Asprin, CMP, Magnesium, CPK, IV fluids, and supportive care. Call poison back with lab results. If qtc prolonged, they have additional recommendations.

## 2021-01-18 NOTE — ED Notes (Signed)
EDP at the bedside giving pt and significant other an update

## 2021-01-18 NOTE — ED Notes (Addendum)
Called Poison Control and reported pt vitals and lab results. Poison control representative said they want pt Magnesium around 2 and Potassium around 4. Representative said to repeat EKG in 4 hour to monitor qtc. Continue to repeat EKG every 4 hours until qtc is less than 500. Stop repeat EKGs when qtc is below 500. Continue to check magnesium  and potassium when checking EKG. Do not administer qtc prolonging agents including zofran.

## 2021-01-18 NOTE — ED Notes (Signed)
TTS evaluation at this time

## 2021-01-18 NOTE — ED Notes (Signed)
Pt changed into purple scrubs.  Pt belongings (shirt, underwear, socks and pants) bagged and given to significant other to take home.  Security notified to wand pt.

## 2021-01-18 NOTE — ED Provider Notes (Signed)
[place] DEPT Provider Note   CSN: [id number] Arrival date & time: 01/18/21  1734     History No chief complaint on file.   Ellen [name] is a 65 y.o. female.  65 year old female presents after ingesting 2 handfuls of trazodone sometime yesterday evening.  States she had emesis immediately afterwards.  States that this was a suicide attempt.  Has had increasing depression recently.  Denies any abdominal discomfort.  Attempted to take more pills today but started having emesis again.  Family was notified and called EMS.  Patient CBG was 140 and given 4 mg of Zofran and transported here.  She denies any other coingestions        Past Medical History:  Diagnosis Date  . Alcohol abuse   . Depression   . Hyperlipidemia   . Hypertension   . Seasonal allergies     Patient Active Problem List   Diagnosis Date Noted  . Tremors of nervous system 03/12/2020  . Insomnia 03/12/2020  . Hypertension   . Hyperlipidemia   . Anxiety and depression   . Alcohol abuse     Past Surgical History:  Procedure Laterality Date  . ABDOMINAL HYSTERECTOMY  1998     OB History   No obstetric history on file.     Family History  Problem Relation Age of Onset  . High Cholesterol Mother   . High blood pressure Mother   . Diabetes Mother   . High blood pressure Father   . High Cholesterol Father   . Diabetes Father   . High blood pressure Brother   . High Cholesterol Brother     Social History   Tobacco Use  . Smoking status: Former Research scientist (life sciences)  . Smokeless tobacco: Never Used  Vaping Use  . Vaping Use: Never used  Substance Use Topics  . Alcohol use: Not Currently  . Drug use: Never    Home Medications Prior to Admission medications   Medication Sig Start Date End Date Taking? Authorizing Provider  benztropine (COGENTIN) 1 MG tablet TAKE 1 TABLET BY MOUTH EVERY DAY 11/10/20   Nafziger, Tommi Rumps, NP  donepezil (ARICEPT) 10 MG tablet Take 1/2  tablet daily for 2 weeks, then increase to 1 tablet daily Patient taking differently: Take 10 mg by mouth daily. 10/30/20   Cameron Sprang, MD  escitalopram (LEXAPRO) 10 MG tablet Take 1 tablet (10 mg total) by mouth daily. 12/01/20   Cameron Sprang, MD  ezetimibe (ZETIA) 10 MG tablet Take 1 tablet (10 mg total) by mouth daily. 11/13/20   Nafziger, Tommi Rumps, NP  hydrochlorothiazide (HYDRODIURIL) 25 MG tablet Take 1 tablet (25 mg total) by mouth daily. 11/13/20   Nafziger, Tommi Rumps, NP  quinapril (ACCUPRIL) 20 MG tablet Take 1 tablet (20 mg total) by mouth daily. 11/13/20 02/11/21  Nafziger, Tommi Rumps, NP  rosuvastatin (CRESTOR) 40 MG tablet Take 1 tablet (40 mg total) by mouth daily. 11/13/20   Dorothyann Peng, NP    Allergies    Patient has no known allergies.  Review of Systems   Review of Systems  All other systems reviewed and are negative.   Physical Exam Updated Vital Signs Ht 1.702 m (5\' 7" )   Wt 72.6 kg   SpO2 95%   BMI 25.06 kg/m   Physical Exam Vitals and nursing note reviewed.  Constitutional:      General: She is not in acute distress.    Appearance: Normal appearance. She is well-developed. She is not toxic-appearing.  HENT:     Head: Normocephalic and atraumatic.  Eyes:     General: Lids are normal.     Conjunctiva/sclera: Conjunctivae normal.     Pupils: Pupils are equal, round, and reactive to light.  Neck:     Thyroid: No thyroid mass.     Trachea: No tracheal deviation.  Cardiovascular:     Rate and Rhythm: Normal rate and regular rhythm.     Heart sounds: Normal heart sounds. No murmur heard. No gallop.   Pulmonary:     Effort: Pulmonary effort is normal. No respiratory distress.     Breath sounds: Normal breath sounds. No stridor. No decreased breath sounds, wheezing, rhonchi or rales.  Abdominal:     General: Bowel sounds are normal. There is no distension.     Palpations: Abdomen is soft.     Tenderness: There is no abdominal tenderness. There is no rebound.   Musculoskeletal:        General: No tenderness. Normal range of motion.     Cervical back: Normal range of motion and neck supple.  Skin:    General: Skin is warm and dry.     Findings: No abrasion or rash.  Neurological:     Mental Status: She is oriented to person, place, and time. She is lethargic.     GCS: GCS eye subscore is 4. GCS verbal subscore is 5. GCS motor subscore is 6.     Cranial Nerves: Cranial nerves are intact. No cranial nerve deficit.     Sensory: No sensory deficit.  Psychiatric:        Attention and Perception: Attention normal.        Mood and Affect: Mood is depressed. Affect is flat.        Speech: Speech is delayed.        Behavior: Behavior is slowed and withdrawn.        Thought Content: Thought content includes suicidal ideation. Thought content includes suicidal plan.     ED Results / Procedures / Treatments   Labs (all labs ordered are listed, but only abnormal results are displayed) Labs Reviewed  RESP PANEL BY RT-PCR (FLU A&B, COVID) [id number]  ETHANOL  RAPID URINE DRUG SCREEN, HOSP PERFORMED  SALICYLATE LEVEL  ACETAMINOPHEN LEVEL  CBC WITH DIFFERENTIAL/PLATELET  COMPREHENSIVE METABOLIC PANEL  LIPASE, BLOOD    EKG EKG Interpretation  Date/Time:  Sunday January 18 2021 18:06:51 EDT Ventricular Rate:  67 PR Interval:  160 QRS Duration: 85 QT Interval:  563 QTC Calculation: 595 R Axis:   38 Text Interpretation: Sinus rhythm Prolonged QT interval Axis normal No acute ischemia No prior for comparison Confirmed by Lorre Munroe ([id number]) on 01/18/2021 6:17:13 PM   Radiology No results found.  Procedures Procedures   Medications Ordered in ED Medications  lactated ringers infusion (has no administration in time range)  lactated ringers bolus 1,000 mL (has no administration in time range)    ED Course  I have reviewed the triage vital signs and the nursing notes.  Pertinent labs & imaging results that were available during my care of the  patient were reviewed by me and considered in my medical decision making (see chart for details).    MDM Rules/Calculators/A&P                          Patient's labs reviewed.  She is now medically clear for psychiatric disposition Final Clinical Impression(s) / ED Diagnoses Final diagnoses:  None    Rx / DC Orders ED Discharge Orders    None       Lacretia Leigh, MD 01/18/21 2002

## 2021-01-18 NOTE — ED Triage Notes (Addendum)
[facility] transferred pt from home and reports the following.  Pt intentionally overdosed on her trazodone prescription last night. Said she took several hand fulls. She denies taking other medications. She did not take her regular medications last night. Pt vomited several times while EMS on scene. EMS administered 4mg  zofran. Family called EMS. Upon questioning pt again, she says she has a 90 day prescription for trazodone 100 mg tablets. Says she "took a couple handfulls."

## 2021-01-19 ENCOUNTER — Emergency Department (HOSPITAL_COMMUNITY): Payer: Medicare Other

## 2021-01-19 ENCOUNTER — Other Ambulatory Visit: Payer: Self-pay | Admitting: Neurology

## 2021-01-19 DIAGNOSIS — F332 Major depressive disorder, recurrent severe without psychotic features: Secondary | ICD-10-CM | POA: Diagnosis not present

## 2021-01-19 DIAGNOSIS — I10 Essential (primary) hypertension: Secondary | ICD-10-CM | POA: Diagnosis not present

## 2021-01-19 DIAGNOSIS — J9811 Atelectasis: Secondary | ICD-10-CM | POA: Diagnosis not present

## 2021-01-19 DIAGNOSIS — F32A Depression, unspecified: Secondary | ICD-10-CM | POA: Diagnosis not present

## 2021-01-19 LAB — URINALYSIS, ROUTINE W REFLEX MICROSCOPIC
Bilirubin Urine: NEGATIVE
Glucose, UA: NEGATIVE mg/dL
Hgb urine dipstick: NEGATIVE
Ketones, ur: 5 mg/dL — AB
Leukocytes,Ua: NEGATIVE
Nitrite: NEGATIVE
Protein, ur: NEGATIVE mg/dL
Specific Gravity, Urine: 1.01 (ref 1.005–1.030)
pH: 7 (ref 5.0–8.0)

## 2021-01-19 LAB — POTASSIUM
Potassium: 3.5 mmol/L (ref 3.5–5.1)
Potassium: 3.5 mmol/L (ref 3.5–5.1)
Potassium: 3.6 mmol/L (ref 3.5–5.1)

## 2021-01-19 LAB — MAGNESIUM
Magnesium: 1.9 mg/dL (ref 1.7–2.4)
Magnesium: 1.9 mg/dL (ref 1.7–2.4)
Magnesium: 1.9 mg/dL (ref 1.7–2.4)

## 2021-01-19 MED ORDER — SODIUM BICARBONATE 8.4 % IV SOLN: 50.0000 meq | Freq: Once | INTRAVENOUS | Status: DC

## 2021-01-19 MED ORDER — POTASSIUM CHLORIDE CRYS ER 20 MEQ PO TBCR
20.0000 meq | EXTENDED_RELEASE_TABLET | Freq: Once | ORAL | Status: AC
  Administered 2021-01-19: 20 meq via ORAL
  Filled 2021-01-19: qty 1

## 2021-01-19 MED ORDER — SODIUM BICARBONATE 8.4 % IV SOLN
50.0000 meq | Freq: Once | INTRAVENOUS | Status: AC
  Administered 2021-01-19: 50 meq via INTRAVENOUS
  Filled 2021-01-19: qty 50

## 2021-01-19 NOTE — Progress Notes (Signed)
01/19/2021  1000  EKG completed and given to provider. QTC 499

## 2021-01-19 NOTE — Progress Notes (Signed)
01/19/2021  1030  Potassium/Magnesium labs drawn. Light green tube sent to main lab.

## 2021-01-19 NOTE — ED Provider Notes (Signed)
Emergency Medicine Observation Re-evaluation Note  Ellen [name] is a 65 y.o. female, seen on rounds today.  Pt initially presented to the ED for complaints of No chief complaint on file. Patient came in for trazodone overdose.  Dr. Ayesha Rumpf note from yesterday implies that she was medically cleared.  But her EKG did have a prolonged QT.  No additional notes other than notes from behavioral health that were looking for [facility] psych placement.  However I believe the poison control must of gotten involved overnight may be at the request of Dr. Johnathan Hausen for the long QT.  And they requested following potassium and magnesium's.  They have been stable.  Her EKG now has borderline QT it is at 499.  Apparently according to nurses poison control requested to get it below 500.  At behavioral health request I order chest x-ray urinalysis today to help with placement.  They both are negative.  Patient without any significant plaints.  According to behavioral health notes significant overdose and depression.  And they are recommending inpatient [facility] psych admission.  Physical Exam  BP (!) 148/88 (BP Location: Right Arm)   Pulse 63   Temp 98.3 F (36.8 C) (Oral)   Resp 14   Ht 1.702 m (5\' 7" )   Wt 72.6 kg   SpO2 96%   BMI 25.06 kg/m  Physical Exam General: Patient in no acute distress Cardiac: rrr Lungs: cta Psych: No aggressive behavior  ED Course / MDM  EKG:EKG Interpretation  Date/Time:  Sunday January 18 2021 18:06:51 EDT Ventricular Rate:  67 PR Interval:  160 QRS Duration: 85 QT Interval:  563 QTC Calculation: 595 R Axis:   38 Text Interpretation: Sinus rhythm Prolonged QT interval Axis normal No acute ischemia No prior for comparison Confirmed by Lorre Munroe ([id number]) on 01/18/2021 6:17:13 PM   I have reviewed the labs performed to date as well as medications administered while in observation.  Recent changes in the last 24 hours include stable potassiums stable magnesium.  Patient's EKG  now has QTC at 499 so below 500 as per poison control's request.  Although the conversation with poison control not documented.  These labs could be stopped now.  I feel patient is once again medically cleared for [facility] psych admission.  Chest x-ray urinalysis done without any acute findings.  Plan  Current plan is for [facility] psych admission. Patient is under full IVC at this time.   Fredia Sorrow, MD 01/19/21 1256

## 2021-01-19 NOTE — ED Notes (Signed)
QTc on 0600 EKG was 520, Dr. Randal Buba notified and new orders received, Pyxis is out of Bicarb, await dose from pharmacy.

## 2021-01-19 NOTE — BH Assessment (Addendum)
[facility] Assessment Progress Note  Per [name], NP, this voluntary pt requires psychiatric hospitalization at this time.  The following facilities have been contacted to seek placement for this pt, with results as noted:  Beds available, information sent, decision pending: [facility]: [facility] (medical acuity)  Unable to reach: [facility] (left message at 11:22)  At capacity: [facility] (unit currently closed) [facility] (unit currently closed) [facility]  If this voluntary pt is accepted to a facility, please discuss disposition with pt to be sure that she agrees to the plan.  If a facility agrees to accept pt and the plan changes in any way please call the facility to inform them of the change.  Final disposition is pending as of this writing.  Jalene Mullet, [facility] Coordinator [phone]

## 2021-01-19 NOTE — ED Provider Notes (Addendum)
Will probably require Geri psych placement.  Based on this we will add on urinalysis and chest x-ray to the work-up.   Ellen Sorrow, MD 01/19/21 [phone]   Results for orders placed or performed during the hospital encounter of 01/18/21  Resp Panel by RT-PCR (Flu A&B, Covid) Nasopharyngeal Swab   Specimen: Nasopharyngeal Swab; Nasopharyngeal(NP) swabs in vial transport medium  Result Value Ref Range   SARS Coronavirus 2 by RT PCR NEGATIVE NEGATIVE   Influenza A by PCR NEGATIVE NEGATIVE   Influenza B by PCR NEGATIVE NEGATIVE  Ethanol  Result Value Ref Range   Alcohol, Ethyl (B) <10 <10 mg/dL  Rapid urine drug screen (hospital performed)  Result Value Ref Range   Opiates NONE DETECTED NONE DETECTED   Cocaine POSITIVE (A) NONE DETECTED   Benzodiazepines NONE DETECTED NONE DETECTED   Amphetamines POSITIVE (A) NONE DETECTED   Tetrahydrocannabinol NONE DETECTED NONE DETECTED   Barbiturates NONE DETECTED NONE DETECTED  Salicylate level  Result Value Ref Range   Salicylate Lvl <2.4 (L) 7.0 - 30.0 mg/dL  Acetaminophen level  Result Value Ref Range   Acetaminophen (Tylenol), Serum <10 (L) 10 - 30 ug/mL  CBC with Differential/Platelet  Result Value Ref Range   WBC 8.5 4.0 - 10.5 K/uL   RBC 4.28 3.87 - 5.11 MIL/uL   Hemoglobin 13.4 12.0 - 15.0 g/dL   HCT 40.3 36.0 - 46.0 %   MCV 94.2 80.0 - 100.0 fL   MCH 31.3 26.0 - 34.0 pg   MCHC 33.3 30.0 - 36.0 g/dL   RDW 12.5 11.5 - 15.5 %   Platelets 196 150 - 400 K/uL   nRBC 0.0 0.0 - 0.2 %   Neutrophils Relative % 81 %   Neutro Abs 6.8 1.7 - 7.7 K/uL   Lymphocytes Relative 13 %   Lymphs Abs 1.1 0.7 - 4.0 K/uL   Monocytes Relative 6 %   Monocytes Absolute 0.5 0.1 - 1.0 K/uL   Eosinophils Relative 0 %   Eosinophils Absolute 0.0 0.0 - 0.5 K/uL   Basophils Relative 0 %   Basophils Absolute 0.0 0.0 - 0.1 K/uL   Immature Granulocytes 0 %   Abs Immature Granulocytes 0.02 0.00 - 0.07 K/uL  Comprehensive metabolic panel  Result Value Ref  Range   Sodium 136 135 - 145 mmol/L   Potassium 4.0 3.5 - 5.1 mmol/L   Chloride 97 (L) 98 - 111 mmol/L   CO2 31 22 - 32 mmol/L   Glucose, Bld 114 (H) 70 - 99 mg/dL   BUN 14 8 - 23 mg/dL   Creatinine, Ser 1.19 (H) 0.44 - 1.00 mg/dL   Calcium 9.2 8.9 - 10.3 mg/dL   Total Protein 7.8 6.5 - 8.1 g/dL   Albumin 4.1 3.5 - 5.0 g/dL   AST 33 15 - 41 U/L   ALT 23 0 - 44 U/L   Alkaline Phosphatase 80 38 - 126 U/L   Total Bilirubin 1.2 0.3 - 1.2 mg/dL   GFR, Estimated 51 (L) >60 mL/min   Anion gap 8 5 - 15  Lipase, blood  Result Value Ref Range   Lipase 25 11 - 51 U/L  Magnesium  Result Value Ref Range   Magnesium 2.2 1.7 - 2.4 mg/dL  CK  Result Value Ref Range   Total CK 147 38 - 234 U/L  Magnesium  Result Value Ref Range   Magnesium 1.9 1.7 - 2.4 mg/dL  Potassium  Result Value Ref Range   Potassium 3.5  3.5 - 5.1 mmol/L  Potassium  Result Value Ref Range   Potassium 3.5 3.5 - 5.1 mmol/L  Magnesium  Result Value Ref Range   Magnesium 1.9 1.7 - 2.4 mg/dL  Potassium  Result Value Ref Range   Potassium 3.5 3.5 - 5.1 mmol/L  Magnesium  Result Value Ref Range   Magnesium 1.9 1.7 - 2.4 mg/dL  Magnesium  Result Value Ref Range   Magnesium 1.9 1.7 - 2.4 mg/dL  Potassium  Result Value Ref Range   Potassium 3.6 3.5 - 5.1 mmol/L   DG Chest 2 View  Result Date: 01/19/2021 CLINICAL DATA:  Medical clearance.  Site placement. EXAM: CHEST - 2 VIEW COMPARISON:  None. FINDINGS: Two-view exam shows basilar atelectasis, right greater than left with mild asymmetric elevation of the right hemidiaphragm. The lungs are clear without focal pneumonia, edema, pneumothorax or pleural effusion. Cardiopericardial silhouette is at upper limits of normal for size. The visualized bony structures of the thorax show no acute abnormality. Telemetry leads overlie the chest. IMPRESSION: Bibasilar atelectasis.  Otherwise no acute cardiopulmonary findings. Electronically Signed   By: Misty Stanley M.D.   On:  01/19/2021 10:23   Chest x-ray without any acute findings.  Urinalysis is in process and pending.   Ellen Sorrow, MD 01/19/21 1142   Urinalysis negative as well.  They are working on inpatient psychiatric Geri psych.   Ellen Sorrow, MD 01/19/21 [phone]

## 2021-01-19 NOTE — ED Notes (Signed)
Call from poison control.  They advise that they would like another serum potassium and magnesium checked at this time and q4hours with EKG.  Replace to achieve K around 4 and Mg around 2.  Will notify EDP

## 2021-01-19 NOTE — ED Notes (Signed)
Spoke with Dr. Randal Buba who reviewed pt chart and found that she has a hx of prolonged QTc, new orders received.

## 2021-01-19 NOTE — ED Notes (Signed)
QTc 564 on last EKG, shown to Dr. Randal Buba

## 2021-01-20 DIAGNOSIS — T50902A Poisoning by unspecified drugs, medicaments and biological substances, intentional self-harm, initial encounter: Secondary | ICD-10-CM

## 2021-01-20 DIAGNOSIS — F332 Major depressive disorder, recurrent severe without psychotic features: Secondary | ICD-10-CM | POA: Diagnosis not present

## 2021-01-20 DIAGNOSIS — I10 Essential (primary) hypertension: Secondary | ICD-10-CM | POA: Diagnosis not present

## 2021-01-20 DIAGNOSIS — F32A Depression, unspecified: Secondary | ICD-10-CM | POA: Diagnosis not present

## 2021-01-20 HISTORY — DX: Poisoning by unspecified drugs, medicaments and biological substances, intentional self-harm, initial encounter: T50.902A

## 2021-01-20 NOTE — BH Assessment (Signed)
[facility] Assessment Progress Note  Per Pecolia Ades, NP, this voluntary pt continues to require psychiatric hospitalization at this time.  The following facilities have been contacted to seek placement for this pt, with results as noted:  Beds available, information sent, decision pending: [facility]  At capacity: [facility] (unit currently closed) [facility] (unit currently closed)  Ifthisvoluntary ptis accepted to a facility, please discuss disposition with pt to be sure that she agreesto the plan. If a facility agrees to accept pt and the plan changes in any way please call the facility to inform them of the change. Final disposition is pending as of this writing.  Jalene Mullet, [facility] Coordinator [phone]

## 2021-01-20 NOTE — BH Assessment (Addendum)
Re-assessment 01/20/2021:  Ellen [name] is a 65 y.o. female that presented to [facility] on 01/18/2021. Pt intentionally overdosed on her trazodone prescription last night. Said she took "several hand fulls". Following the overdose she started to feel "very sick". Immediately, informed her wife and granddaughter what she had done.  Family called EMS. ED provider noted: "Upon questioning pt again, she says she should have dad that many pills because they should have been discontinued.  She had a  90 day prescription for trazodone 100 mg tablets".   Today, patient reports suicidal ideations on-going since she was diagnosed with dementia, March 2022. Patient tearful and states her father was also Dx's with Dementia and she watched his digression.  Today, she feels better but still not herself. No history of sucide attempts and/or gestures. No self-mutilating behaviors. Her sleep routine varies, "up and down". She sleeps 6-7 hrs per night. Appetite is fair. States she has never eaten a lot of food. However, since her diagnosis she has been trying to eat healthy. She still exercises every day.   Discussed with patient her stressors in length. States that she recently retired from [employer] and her job at [employer] (February 12, 2021).  She didn't feel as if she was ready to retire. However,  was "forced into retirement", given a Customer service manager, and her job downsized. Patient was hoping to stay employed at least on a part time basis and is now unemployed.   She has a brother in [place] and says he has been calling her every day, also supportive. However, she doesn't think he knows about her diagnosis yet. She does not want to be a burden to him. States that her wife and granddaughter are also supportive.  Patient denies HI. Denies history of aggressive and assaultive behaviors. States that she has visual hallucinations intermittently. Her visual  hallucinations started when she was RX Trazadone. Patient was prescribed  Trazadone years ago. History of alcoholism. She was in a rehabilitation program. Her last drink was February 04, 2020.   Patient is overall calm and cooperative. Pleasant. Speech is normal. Affect is depressed and sad. Insight and judgement is good. No issues with impulse. She does not appear delusional and is not responding to internal stimuli.   Disposition: Per Ricky Ala, NP and Pecolia Ades, NP, recommend inpatient geriatric-psychiatry. Disposition Counselor to seek appropriate placement .

## 2021-01-20 NOTE — Consult Note (Signed)
[place] Psychiatry Consult   Reason for Consult:  Psychiatric evaluation Referring Physician:  Dr. Kathrynn Humble, [place] Patient Identification: Ellen [name] MRN:  [id number] Principal Diagnosis: Suicidal overdose, initial encounter [facility]) Diagnosis:  Principal Problem:   Suicidal overdose, initial encounter ([place])   Total Time spent with patient: 15 minutes  Subjective:   Ellen [name] is a 65 y.o. female patient admitted after ingesting 2 handfuls of trazodone yesterday evening in an attempt to commit suicide. Worsening depression with recent diagnosis of dementia.   HPI :[name], 65 y.o., female patient seen face to face by this provider, consulted with Dr. Dwyane Dee; and chart reviewed on 01/20/21.  On evaluation Timia Casselman reports that she has increasingly worse depression with a recent diagnosis of dementia and took 2 handfuls of trazodone with intent to kill herself.   During evaluation [name] is lying on ED stretcher in no acute distress. She is alert, oriented x 3, calm, cooperative and attentive.  Her mood is depressed with congruent affect.  She has normal speech, and behavior.  Objectively there is no evidence of psychosis/mania or delusional thinking.  Patient is able to converse coherently, goal directed thoughts, no distractibility, or pre-occupation.  *She endorses  suicidal/self-harm ideation with intent and plan with means to carry out this plan due to recent diagnosis of dementia, denies homicidal ideation, , psychosis, and paranoia. Patient answered question appropriately. Lives with her wife. Reports recent rehab stay for alcohol. Denies recent substance/ETOH use.   Past Psychiatric History: none  Risk to Self:  yes  Risk to Others:  no Prior Inpatient Therapy:  no Prior Outpatient Therapy:  sees neuro, Dr. Delice Lesch for cognitive  impairment.   Past Medical History:  Past Medical History:  Diagnosis Date  . Alcohol  abuse   . Depression   . Hyperlipidemia   . Hypertension   . Seasonal allergies     Past Surgical History:  Procedure Laterality Date  . ABDOMINAL HYSTERECTOMY  1998   Family History:  Family History  Problem Relation Age of Onset  . High Cholesterol Mother   . High blood pressure Mother   . Diabetes Mother   . High blood pressure Father   . High Cholesterol Father   . Diabetes Father   . High blood pressure Brother   . High Cholesterol Brother    Family Psychiatric  History: father with dementia Social History:  Social History   Substance and Sexual Activity  Alcohol Use Not Currently     Social History   Substance and Sexual Activity  Drug Use Never    Social History   Socioeconomic History  . Marital status: Married    Spouse name: Not on file  . Number of children: Not on file  . Years of education: 78  . Highest education level: Not on file  Occupational History  . Not on file  Tobacco Use  . Smoking status: Former Research scientist (life sciences)  . Smokeless tobacco: Never Used  Vaping Use  . Vaping Use: Never used  Substance and Sexual Activity  . Alcohol use: Not Currently  . Drug use: Never  . Sexual activity: Not on file  Other Topics Concern  . 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    Married to partner    Right handed   Drinks caffeine   Two story home      Social Determinants of Health  Financial Resource Strain: Not on file  Food Insecurity: Not on file  Transportation Needs: Not on file  Physical Activity: Not on file  Stress: Not on file  Social Connections: Not on file   Additional Social History:    Allergies:  No Known Allergies  Labs:  Results for orders placed or performed during the hospital encounter of 01/18/21 (from the past 48 hour(s))  Magnesium     Status: None   Collection Time: 01/18/21  6:08 PM  Result Value Ref Range   Magnesium 2.2 1.7 - 2.4 mg/dL    Comment: Performed at  [facility], [place] [address] [place], [place] [address]  CK     Status: None   Collection Time: 01/18/21  6:08 PM  Result Value Ref Range   Total CK 147 38 - 234 U/L    Comment: Performed at [facility], [place] [address] [place], [place] [address]  Ethanol     Status: None   Collection Time: 01/18/21  6:20 PM  Result Value Ref Range   Alcohol, Ethyl (B) <10 <10 mg/dL    Comment: (NOTE) Lowest detectable limit for serum alcohol is 10 mg/dL.  For medical purposes only. Performed at [facility], [place] [address], [place], [place] [address]   Rapid urine drug screen (hospital performed)     Status: Abnormal   Collection Time: 01/18/21  6:20 PM  Result Value Ref Range   Opiates NONE DETECTED NONE DETECTED   Cocaine POSITIVE (A) NONE DETECTED   Benzodiazepines NONE DETECTED NONE DETECTED   Amphetamines POSITIVE (A) NONE DETECTED   Tetrahydrocannabinol NONE DETECTED NONE DETECTED   Barbiturates NONE DETECTED NONE DETECTED    Comment: (NOTE) DRUG SCREEN FOR MEDICAL PURPOSES ONLY.  IF CONFIRMATION IS NEEDED FOR ANY PURPOSE, NOTIFY LAB WITHIN 5 DAYS.  LOWEST DETECTABLE LIMITS FOR URINE DRUG SCREEN Drug Class                     Cutoff (ng/mL) Amphetamine and metabolites    1000 Barbiturate and metabolites    200 Benzodiazepine                 517 Tricyclics and metabolites     300 Opiates and metabolites        300 Cocaine and metabolites        300 THC                            50 Performed at [facility], [place] [address], [place], [place] [address]   Salicylate level     Status: Abnormal   Collection Time: 01/18/21  6:20 PM  Result Value Ref Range   Salicylate Lvl <3.7 (L) 7.0 - 30.0 mg/dL    Comment: Performed at [facility], [place] [address] [place],  [address]  Acetaminophen level     Status: Abnormal   Collection Time: 01/18/21  6:20 PM  Result Value Ref Range    Acetaminophen (Tylenol), Serum <10 (L) 10 - 30 ug/mL    Comment: (NOTE) Therapeutic concentrations vary significantly. A range of 10-30 ug/mL  may be an effective concentration for many patients. However, some  are best treated at concentrations outside of this range. Acetaminophen concentrations >150 ug/mL at 4 hours after ingestion  and >50 ug/mL at 12 hours after ingestion are often associated with  toxic reactions.  Performed at [facility]  [place] [address] [place], [place] [address]   CBC with Differential/Platelet     Status: None   Collection Time: 01/18/21  6:20 PM  Result Value Ref Range   WBC 8.5 4.0 - 10.5 K/uL   RBC 4.28 3.87 - 5.11 MIL/uL   Hemoglobin 13.4 12.0 - 15.0 g/dL   HCT 40.3 36.0 - 46.0 %   MCV 94.2 80.0 - 100.0 fL   MCH 31.3 26.0 - 34.0 pg   MCHC 33.3 30.0 - 36.0 g/dL   RDW 12.5 11.5 - 15.5 %   Platelets 196 150 - 400 K/uL   nRBC 0.0 0.0 - 0.2 %   Neutrophils Relative % 81 %   Neutro Abs 6.8 1.7 - 7.7 K/uL   Lymphocytes Relative 13 %   Lymphs Abs 1.1 0.7 - 4.0 K/uL   Monocytes Relative 6 %   Monocytes Absolute 0.5 0.1 - 1.0 K/uL   Eosinophils Relative 0 %   Eosinophils Absolute 0.0 0.0 - 0.5 K/uL   Basophils Relative 0 %   Basophils Absolute 0.0 0.0 - 0.1 K/uL   Immature Granulocytes 0 %   Abs Immature Granulocytes 0.02 0.00 - 0.07 K/uL    Comment: Performed at [facility], [place] [address], [place], [place] [address]  Comprehensive metabolic panel     Status: Abnormal   Collection Time: 01/18/21  6:20 PM  Result Value Ref Range   Sodium 136 135 - 145 mmol/L   Potassium 4.0 3.5 - 5.1 mmol/L   Chloride 97 (L) 98 - 111 mmol/L   CO2 31 22 - 32 mmol/L   Glucose, Bld 114 (H) 70 - 99 mg/dL    Comment: Glucose reference range applies only to samples taken after fasting for at least 8 hours.   BUN 14 8 - 23 mg/dL   Creatinine, Ser 1.19 (H) 0.44 - 1.00 mg/dL   Calcium 9.2 8.9 - 10.3 mg/dL   Total Protein 7.8 6.5 -  8.1 g/dL   Albumin 4.1 3.5 - 5.0 g/dL   AST 33 15 - 41 U/L   ALT 23 0 - 44 U/L   Alkaline Phosphatase 80 38 - 126 U/L   Total Bilirubin 1.2 0.3 - 1.2 mg/dL   GFR, Estimated 51 (L) >60 mL/min    Comment: (NOTE) Calculated using the CKD-EPI Creatinine Equation (2021)    Anion gap 8 5 - 15    Comment: Performed at [facility], [place] [address], [place], [place] [address]  Lipase, blood     Status: None   Collection Time: 01/18/21  6:20 PM  Result Value Ref Range   Lipase 25 11 - 51 U/L    Comment: Performed at [facility], [place] [address], [place], [place] [address]  Resp Panel by RT-PCR (Flu A&B, Covid) Nasopharyngeal Swab     Status: None   Collection Time: 01/18/21  6:20 PM   Specimen: Nasopharyngeal Swab; Nasopharyngeal(NP) swabs in vial transport medium  Result Value Ref Range   SARS Coronavirus 2 by RT PCR NEGATIVE NEGATIVE    Comment: (NOTE) SARS-CoV-2 target nucleic acids are NOT DETECTED.  The SARS-CoV-2 RNA is generally detectable in upper respiratory specimens during the acute phase of infection. The lowest concentration of SARS-CoV-2 viral copies this assay can detect is 138 copies/mL. A negative result does not preclude SARS-Cov-2 infection and should not be used as the sole basis for treatment or other patient management decisions. A negative result may  occur with  improper specimen collection/handling, submission of specimen other than nasopharyngeal swab, presence of viral mutation(s) within the areas targeted by this assay, and inadequate number of viral copies(<138 copies/mL). A negative result must be combined with clinical observations, patient history, and epidemiological information. The expected result is Negative.  Fact Sheet for Patients:  EntrepreneurPulse.com.au  Fact Sheet for Healthcare Providers:  IncredibleEmployment.be  This test is no t yet approved or cleared by the  [place] FDA and  has been authorized for detection and/or diagnosis of SARS-CoV-2 by FDA under an Emergency Use Authorization (EUA). This EUA will remain  in effect (meaning this test can be used) for the duration of the COVID-19 declaration under Section 564(b)(1) of the Act, 21 U.S.C.section 360bbb-3(b)(1), unless the authorization is terminated  or revoked sooner.       Influenza A by PCR NEGATIVE NEGATIVE   Influenza B by PCR NEGATIVE NEGATIVE    Comment: (NOTE) The Xpert Xpress SARS-CoV-2/FLU/RSV plus assay is intended as an aid in the diagnosis of influenza from Nasopharyngeal swab specimens and should not be used as a sole basis for treatment. Nasal washings and aspirates are unacceptable for Xpert Xpress SARS-CoV-2/FLU/RSV testing.  Fact Sheet for Patients: EntrepreneurPulse.com.au  Fact Sheet for Healthcare Providers: IncredibleEmployment.be  This test is not yet approved or cleared by the [place] FDA and has been authorized for detection and/or diagnosis of SARS-CoV-2 by FDA under an Emergency Use Authorization (EUA). This EUA will remain in effect (meaning this test can be used) for the duration of the COVID-19 declaration under Section 564(b)(1) of the Act, 21 U.S.C. section 360bbb-3(b)(1), unless the authorization is terminated or revoked.  Performed at [facility], [place] [address], [place], [place] [address]   Magnesium     Status: None   Collection Time: 01/18/21 10:26 PM  Result Value Ref Range   Magnesium 1.9 1.7 - 2.4 mg/dL    Comment: Performed at [facility], [place] [address] [place], [place] [address]  Potassium     Status: None   Collection Time: 01/18/21 10:26 PM  Result Value Ref Range   Potassium 3.5 3.5 - 5.1 mmol/L    Comment: Performed at [facility], [place] [address], [place], [place] [address]  Potassium     Status: None   Collection Time:  01/19/21  2:32 AM  Result Value Ref Range   Potassium 3.5 3.5 - 5.1 mmol/L    Comment: Performed at [facility], [place] [address] [place], [place] [address]  Magnesium     Status: None   Collection Time: 01/19/21  2:32 AM  Result Value Ref Range   Magnesium 1.9 1.7 - 2.4 mg/dL    Comment: Performed at [facility], [place] [address], [place], [place] [address]  Potassium     Status: None   Collection Time: 01/19/21  6:07 AM  Result Value Ref Range   Potassium 3.5 3.5 - 5.1 mmol/L    Comment: Performed at [facility], [place] [address], [place], [place] [address]  Magnesium     Status: None   Collection Time: 01/19/21  6:07 AM  Result Value Ref Range   Magnesium 1.9 1.7 - 2.4 mg/dL    Comment: Performed at [facility], [place] [address] [place], [place] [address]  Magnesium     Status: None   Collection Time: 01/19/21 10:30 AM  Result Value Ref Range   Magnesium 1.9  1.7 - 2.4 mg/dL    Comment: Performed at [facility], [place] [address], [place], [place] [address]  Potassium     Status: None   Collection Time: 01/19/21 10:30 AM  Result Value Ref Range   Potassium 3.6 3.5 - 5.1 mmol/L    Comment: Performed at [facility], [place] [address] [place], [place] [address]    Current Facility-Administered Medications  Medication Dose Route Frequency Provider Last Rate Last Admin  . ezetimibe (ZETIA) tablet 10 mg  10 mg Oral Daily Lacretia Leigh, MD   10 mg at 01/20/21 0910  . hydrochlorothiazide (HYDRODIURIL) tablet 25 mg  25 mg Oral Daily Lacretia Leigh, MD   25 mg at 01/20/21 0910  . lactated ringers infusion   Intravenous Continuous Lacretia Leigh, MD 125 mL/hr at 01/19/21 1130 New Bag at 01/19/21 1130  . lisinopril (ZESTRIL) tablet 20 mg  20 mg Oral Daily Lacretia Leigh, MD   20 mg at 01/20/21 0910  . rosuvastatin (CRESTOR) tablet 40 mg  40 mg Oral Daily Lacretia Leigh, MD   40 mg  at 01/20/21 9702   Current Outpatient Medications  Medication Sig Dispense Refill  . benztropine (COGENTIN) 1 MG tablet TAKE 1 TABLET BY MOUTH EVERY DAY (Patient taking differently: Take 1 mg by mouth every morning.) 90 tablet 1  . donepezil (ARICEPT) 10 MG tablet Take 1/2 tablet daily for 2 weeks, then increase to 1 tablet daily (Patient taking differently: Take 10 mg by mouth every morning.) 30 tablet 11  . ezetimibe (ZETIA) 10 MG tablet Take 1 tablet (10 mg total) by mouth daily. (Patient taking differently: Take 10 mg by mouth every morning.) 90 tablet 0  . hydrochlorothiazide (HYDRODIURIL) 25 MG tablet Take 1 tablet (25 mg total) by mouth daily. (Patient taking differently: Take 25 mg by mouth every morning.) 90 tablet 0  . Magnesium 250 MG TABS Take 250 mg by mouth daily as needed (cramps).    . Multiple Vitamin (MULTIVITAMIN WITH MINERALS) TABS tablet Take 1 tablet by mouth every morning. ALIVE    . naproxen sodium (ALEVE) 220 MG tablet Take 220-440 mg by mouth 2 (two) times daily as needed (pain).    . quinapril (ACCUPRIL) 20 MG tablet Take 1 tablet (20 mg total) by mouth daily. (Patient taking differently: Take 20 mg by mouth every morning.) 90 tablet 0  . rosuvastatin (CRESTOR) 40 MG tablet Take 1 tablet (40 mg total) by mouth daily. (Patient taking differently: Take 40 mg by mouth every morning.) 90 tablet 0  . escitalopram (LEXAPRO) 10 MG tablet TAKE 1 TABLET BY MOUTH EVERY DAY 90 tablet 0  . traZODone (DESYREL) 100 MG tablet Take 200 mg by mouth at bedtime. (Patient not taking: No sig reported)      Musculoskeletal: Strength & Muscle Tone: within normal limits Gait & Station: did not visualize mobilty while in ED Patient leans: N/A            Psychiatric Specialty Exam:  Presentation  General Appearance: Fairly Groomed  Eye Contact:Fair  Speech:Clear and Coherent  Speech Volume:Normal  Handedness:Right   Mood and Affect   Mood:Depressed  Affect:Congruent   Thought Process  Thought Processes:Disorganized  Descriptions of Associations:Intact  Orientation:Full (Time, Place and Person)  Thought Content:Logical  History of Schizophrenia/Schizoaffective disorder:No  Duration of Psychotic Symptoms:Less than six months  Hallucinations:Hallucinations: None  Ideas of Reference:None  Suicidal Thoughts:Suicidal Thoughts: Yes, Active SI Active Intent and/or Plan: With Intent; With Plan; With Means  to [place]  Homicidal Thoughts:Homicidal Thoughts: No   Sensorium  Memory:Immediate Good; Recent Good  Judgment:Fair  Insight:Fair   Executive Functions  Concentration:Fair  Attention Span:Fair  Recall:Good  Fund of Knowledge:Good  Language:Good   Psychomotor Activity  Psychomotor Activity:Psychomotor Activity: Normal   Assets  Assets:Communication Skills; Desire for Improvement; Financial Resources/Insurance; Housing   Sleep  Sleep:Sleep: Fair   Physical Exam: Physical Exam Cardiovascular:     Rate and Rhythm: Bradycardia present.  Pulmonary:     Effort: Pulmonary effort is normal.  Neurological:     Mental Status: She is alert and oriented to person, place, and time.  Psychiatric:        Attention and Perception: Attention normal.        Mood and Affect: Mood is depressed.        Speech: Speech normal.        Behavior: Behavior is cooperative.        Thought Content: Thought content includes suicidal ideation. Thought content does not include homicidal ideation. Thought content includes suicidal plan. Thought content does not include homicidal plan.        Cognition and Memory: Cognition is impaired (recent diagnosis of dementia).    Review of Systems  Respiratory: Negative for cough.   Psychiatric/Behavioral: Positive for depression, memory loss (recent diagnosis of dementia) and suicidal ideas. Negative for hallucinations and substance abuse (recent rehab for ETOH ).    Blood pressure 140/75, pulse (!) 47, temperature 98 F (36.7 C), temperature source Oral, resp. rate 14, height 5\' 7"  (1.702 m), weight 72.6 kg, SpO2 96 %. Body mass index is 25.06 kg/m.  Treatment Plan Summary: Daily contact with patient to assess and evaluate symptoms and progress in treatment and Medication management. EDP will continue to monitor QTc.   Disposition: Recommend geriatric psychiatric Inpatient admission when medically cleared. Staff to continue to monitor for safety.   [place] Guest, NP 01/20/2021 11:43 AM

## 2021-01-20 NOTE — Consult Note (Signed)
[facility] Consult   Reason for Consult:  Suicidal attempt Referring Physician:  EPD Patient Identification: Ellen [name] MRN:  [id number] Principal Diagnosis: <principal problem not specified> Diagnosis:  Active Problems:   * No active hospital problems. *   Total Time spent with patient: 15 minutes  Subjective:   Ellen [name] is a 65 y.o. female was seen and evaluated face-to-face.  She continues to present flat and depressed.  She reports suicide attempt due to new diagnosis of dementia.  States her wife and her brother asked her to be evaluated.  States her father died from dementia.  Continues to report symptoms of worry related to " being a burden to others".  States she is currently followed by therapist and psychiatrist for medication management.  Reported history with alcohol abuse however currently states she is in remission.  Denies auditory or visual hallucinations.  We will continue to recommend inpatient admission.   Discontinue Lexapro 10 mg due to prolonged QTC concerns, continue to monitor EGK. Patient to start Zoloft 25 mg po daily.   HPI:Per admission assessment note: Pt is a 65 year old female who presents to [facility] ED accompanied by her significant other, [name] [phone], who participated in assessment at Pt's request. Pt says she has felt very depressed recently, stating "There are a lot of things going on in my world." Pt reports she has a history of depression and has been severely depressed for the past two weeks. Pt acknowledges that she ingested two handfuls of Trazodone yesterday in a suicide attempt. Pt attempted to take more pills today but experienced emesis. She denies any history of previous suicide attempts. Pt describes her mood recently as "unhappy." Pt acknowledges symptoms including crying spells, social withdrawal, loss of interest in usual pleasures, fatigue, irritability, decreased concentration, decreased  sleep, decreased appetite and feelings of worthlessness and hopelessness. Pt denies any history of intentional self-injurious behaviors. Pt denies current homicidal ideation or history of violence. Pt denies current auditory or visual hallucinations.   Past Psychiatric History:   Risk to Self:   Risk to Others:   Prior Inpatient Therapy:   Prior Outpatient Therapy:    Past Medical History:  Past Medical History:  Diagnosis Date  . Alcohol abuse   . Depression   . Hyperlipidemia   . Hypertension   . Seasonal allergies     Past Surgical History:  Procedure Laterality Date  . ABDOMINAL HYSTERECTOMY  1998   Family History:  Family History  Problem Relation Age of Onset  . High Cholesterol Mother   . High blood pressure Mother   . Diabetes Mother   . High blood pressure Father   . High Cholesterol Father   . Diabetes Father   . High blood pressure Brother   . High Cholesterol Brother    Family Psychiatric  History:  Social History:  Social History   Substance and Sexual Activity  Alcohol Use Not Currently     Social History   Substance and Sexual Activity  Drug Use Never    Social History   Socioeconomic History  . Marital status: Married    Spouse name: Not on file  . Number of children: Not on file  . Years of education: 73  . Highest education level: Not on file  Occupational History  . Not on file  Tobacco Use  . Smoking status: Former Research scientist (life sciences)  . Smokeless tobacco: Never Used  Vaping Use  . Vaping Use: Never  used  Substance and Sexual Activity  . Alcohol use: Not Currently  . Drug use: Never  . Sexual activity: Not on file  Other Topics Concern  . 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    Married to partner    Right handed   Drinks caffeine   Two story home      Social Determinants of Health   Financial Resource Strain: Not on file  Food Insecurity: Not on file  Transportation Needs: Not on  file  Physical Activity: Not on file  Stress: Not on file  Social Connections: Not on file   Additional Social History:    Allergies:  No Known Allergies  Labs:  Results for orders placed or performed during the hospital encounter of 01/18/21 (from the past 48 hour(s))  Magnesium     Status: None   Collection Time: 01/18/21  6:08 PM  Result Value Ref Range   Magnesium 2.2 1.7 - 2.4 mg/dL    Comment: Performed at [facility], [place] [address] [place], [place] [address]  CK     Status: None   Collection Time: 01/18/21  6:08 PM  Result Value Ref Range   Total CK 147 38 - 234 U/L    Comment: Performed at [facility], [place] [address] [place], [place] [address]  Ethanol     Status: None   Collection Time: 01/18/21  6:20 PM  Result Value Ref Range   Alcohol, Ethyl (B) <10 <10 mg/dL    Comment: (NOTE) Lowest detectable limit for serum alcohol is 10 mg/dL.  For medical purposes only. Performed at [facility], [place] [address], [place], [place] [address]   Rapid urine drug screen (hospital performed)     Status: Abnormal   Collection Time: 01/18/21  6:20 PM  Result Value Ref Range   Opiates NONE DETECTED NONE DETECTED   Cocaine POSITIVE (A) NONE DETECTED   Benzodiazepines NONE DETECTED NONE DETECTED   Amphetamines POSITIVE (A) NONE DETECTED   Tetrahydrocannabinol NONE DETECTED NONE DETECTED   Barbiturates NONE DETECTED NONE DETECTED    Comment: (NOTE) DRUG SCREEN FOR MEDICAL PURPOSES ONLY.  IF CONFIRMATION IS NEEDED FOR ANY PURPOSE, NOTIFY LAB WITHIN 5 DAYS.  LOWEST DETECTABLE LIMITS FOR URINE DRUG SCREEN Drug Class                     Cutoff (ng/mL) Amphetamine and metabolites    1000 Barbiturate and metabolites    200 Benzodiazepine                 914 Tricyclics and metabolites     300 Opiates and metabolites        300 Cocaine and metabolites        300 THC                            50 Performed at [facility], [place] [address] [place], [place] [address]   Salicylate level     Status: Abnormal   Collection Time: 01/18/21  6:20 PM  Result Value Ref Range   Salicylate Lvl <6.2 (L) 7.0 - 30.0 mg/dL    Comment: Performed at [facility], [place] [address], [place], [place] [address]  Acetaminophen level     Status: Abnormal   Collection Time: 01/18/21  6:20 PM  Result Value Ref Range   Acetaminophen (Tylenol), Serum <10 (L) 10 - 30 ug/mL    Comment: (NOTE) Therapeutic concentrations vary significantly. A range of 10-30 ug/mL  may be an effective concentration for many patients. However, some  are best treated at concentrations outside of this range. Acetaminophen concentrations >150 ug/mL at 4 hours after ingestion  and >50 ug/mL at 12 hours after ingestion are often associated with  toxic reactions.  Performed at [facility], [place] [address], [place], [place] [address]   CBC with Differential/Platelet     Status: None   Collection Time: 01/18/21  6:20 PM  Result Value Ref Range   WBC 8.5 4.0 - 10.5 K/uL   RBC 4.28 3.87 - 5.11 MIL/uL   Hemoglobin 13.4 12.0 - 15.0 g/dL   HCT 40.3 36.0 - 46.0 %   MCV 94.2 80.0 - 100.0 fL   MCH 31.3 26.0 - 34.0 pg   MCHC 33.3 30.0 - 36.0 g/dL   RDW 12.5 11.5 - 15.5 %   Platelets 196 150 - 400 K/uL   nRBC 0.0 0.0 - 0.2 %   Neutrophils Relative % 81 %   Neutro Abs 6.8 1.7 - 7.7 K/uL   Lymphocytes Relative 13 %   Lymphs Abs 1.1 0.7 - 4.0 K/uL   Monocytes Relative 6 %   Monocytes Absolute 0.5 0.1 - 1.0 K/uL   Eosinophils Relative 0 %   Eosinophils Absolute 0.0 0.0 - 0.5 K/uL   Basophils Relative 0 %   Basophils Absolute 0.0 0.0 - 0.1 K/uL   Immature Granulocytes 0 %   Abs Immature Granulocytes 0.02 0.00 - 0.07 K/uL    Comment: Performed at [facility], [place] [address] [place], [place] [address]  Comprehensive metabolic panel     Status: Abnormal   Collection Time: 01/18/21   6:20 PM  Result Value Ref Range   Sodium 136 135 - 145 mmol/L   Potassium 4.0 3.5 - 5.1 mmol/L   Chloride 97 (L) 98 - 111 mmol/L   CO2 31 22 - 32 mmol/L   Glucose, Bld 114 (H) 70 - 99 mg/dL    Comment: Glucose reference range applies only to samples taken after fasting for at least 8 hours.   BUN 14 8 - 23 mg/dL   Creatinine, Ser 1.19 (H) 0.44 - 1.00 mg/dL   Calcium 9.2 8.9 - 10.3 mg/dL   Total Protein 7.8 6.5 - 8.1 g/dL   Albumin 4.1 3.5 - 5.0 g/dL   AST 33 15 - 41 U/L   ALT 23 0 - 44 U/L   Alkaline Phosphatase 80 38 - 126 U/L   Total Bilirubin 1.2 0.3 - 1.2 mg/dL   GFR, Estimated 51 (L) >60 mL/min    Comment: (NOTE) Calculated using the CKD-EPI Creatinine Equation (2021)    Anion gap 8 5 - 15    Comment: Performed at [facility], [place] [address] [place], [place] [address]  Lipase, blood     Status: None   Collection Time: 01/18/21  6:20 PM  Result Value Ref Range   Lipase 25 11 - 51 U/L    Comment: Performed at [facility], [place] [address] [place], [place] [address]  Resp Panel by RT-PCR (Flu A&B, Covid) Nasopharyngeal Swab     Status: None   Collection Time: 01/18/21  6:20 PM   Specimen: Nasopharyngeal Swab; Nasopharyngeal(NP) swabs in vial transport medium  Result Value Ref Range  SARS Coronavirus 2 by RT PCR NEGATIVE NEGATIVE    Comment: (NOTE) SARS-CoV-2 target nucleic acids are NOT DETECTED.  The SARS-CoV-2 RNA is generally detectable in upper respiratory specimens during the acute phase of infection. The lowest concentration of SARS-CoV-2 viral copies this assay can detect is 138 copies/mL. A negative result does not preclude SARS-Cov-2 infection and should not be used as the sole basis for treatment or other patient management decisions. A negative result may occur with  improper specimen collection/handling, submission of specimen other than nasopharyngeal swab, presence of viral mutation(s) within the areas targeted by  this assay, and inadequate number of viral copies(<138 copies/mL). A negative result must be combined with clinical observations, patient history, and epidemiological information. The expected result is Negative.  Fact Sheet for Patients:  EntrepreneurPulse.com.au  Fact Sheet for Healthcare Providers:  IncredibleEmployment.be  This test is no t yet approved or cleared by the [place] FDA and  has been authorized for detection and/or diagnosis of SARS-CoV-2 by FDA under an Emergency Use Authorization (EUA). This EUA will remain  in effect (meaning this test can be used) for the duration of the COVID-19 declaration under Section 564(b)(1) of the Act, 21 U.S.C.section 360bbb-3(b)(1), unless the authorization is terminated  or revoked sooner.       Influenza A by PCR NEGATIVE NEGATIVE   Influenza B by PCR NEGATIVE NEGATIVE    Comment: (NOTE) The Xpert Xpress SARS-CoV-2/FLU/RSV plus assay is intended as an aid in the diagnosis of influenza from Nasopharyngeal swab specimens and should not be used as a sole basis for treatment. Nasal washings and aspirates are unacceptable for Xpert Xpress SARS-CoV-2/FLU/RSV testing.  Fact Sheet for Patients: EntrepreneurPulse.com.au  Fact Sheet for Healthcare Providers: IncredibleEmployment.be  This test is not yet approved or cleared by the [place] FDA and has been authorized for detection and/or diagnosis of SARS-CoV-2 by FDA under an Emergency Use Authorization (EUA). This EUA will remain in effect (meaning this test can be used) for the duration of the COVID-19 declaration under Section 564(b)(1) of the Act, 21 U.S.C. section 360bbb-3(b)(1), unless the authorization is terminated or revoked.  Performed at [facility], [place] [address], [place], [place] [address]   Magnesium     Status: None   Collection Time: 01/18/21 10:26 PM  Result  Value Ref Range   Magnesium 1.9 1.7 - 2.4 mg/dL    Comment: Performed at [facility], [place] [address], [place], [place] [address]  Potassium     Status: None   Collection Time: 01/18/21 10:26 PM  Result Value Ref Range   Potassium 3.5 3.5 - 5.1 mmol/L    Comment: Performed at [facility], [place] [address] [place], [place] [address]  Potassium     Status: None   Collection Time: 01/19/21  2:32 AM  Result Value Ref Range   Potassium 3.5 3.5 - 5.1 mmol/L    Comment: Performed at [facility], [place] [address] [place], [place] [address]  Magnesium     Status: None   Collection Time: 01/19/21  2:32 AM  Result Value Ref Range   Magnesium 1.9 1.7 - 2.4 mg/dL    Comment: Performed at [facility], [place] [address], [place], [place] [address]  Potassium     Status: None   Collection Time: 01/19/21  6:07 AM  Result Value Ref Range   Potassium 3.5 3.5 - 5.1 mmol/L    Comment: Performed at [facility], [place] [address] [place], [place] [address]  Magnesium     Status: None   Collection Time: 01/19/21  6:07 AM  Result Value Ref Range   Magnesium 1.9 1.7 - 2.4 mg/dL    Comment: Performed at [facility], [place] [address], [place], [place] [address]  Magnesium     Status: None   Collection Time: 01/19/21 10:30 AM  Result Value Ref Range   Magnesium 1.9 1.7 - 2.4 mg/dL    Comment: Performed at [facility], [place] [address], [place], [place] [address]  Potassium     Status: None   Collection Time: 01/19/21 10:30 AM  Result Value Ref Range   Potassium 3.6 3.5 - 5.1 mmol/L    Comment: Performed at [facility], [place] [address] [place], [place] [address]    Current Facility-Administered Medications  Medication Dose Route Frequency Provider Last Rate Last Admin  . ezetimibe (ZETIA) tablet 10 mg  10 mg Oral Daily Lacretia Leigh, MD   10 mg at 01/20/21  0910  . hydrochlorothiazide (HYDRODIURIL) tablet 25 mg  25 mg Oral Daily Lacretia Leigh, MD   25 mg at 01/20/21 0910  . lactated ringers infusion   Intravenous Continuous Lacretia Leigh, MD 125 mL/hr at 01/19/21 1130 New Bag at 01/19/21 1130  . lisinopril (ZESTRIL) tablet 20 mg  20 mg Oral Daily Lacretia Leigh, MD   20 mg at 01/20/21 0910  . rosuvastatin (CRESTOR) tablet 40 mg  40 mg Oral Daily Lacretia Leigh, MD   40 mg at 01/20/21 7619   Current Outpatient Medications  Medication Sig Dispense Refill  . benztropine (COGENTIN) 1 MG tablet TAKE 1 TABLET BY MOUTH EVERY DAY (Patient taking differently: Take 1 mg by mouth every morning.) 90 tablet 1  . donepezil (ARICEPT) 10 MG tablet Take 1/2 tablet daily for 2 weeks, then increase to 1 tablet daily (Patient taking differently: Take 10 mg by mouth every morning.) 30 tablet 11  . ezetimibe (ZETIA) 10 MG tablet Take 1 tablet (10 mg total) by mouth daily. (Patient taking differently: Take 10 mg by mouth every morning.) 90 tablet 0  . hydrochlorothiazide (HYDRODIURIL) 25 MG tablet Take 1 tablet (25 mg total) by mouth daily. (Patient taking differently: Take 25 mg by mouth every morning.) 90 tablet 0  . Magnesium 250 MG TABS Take 250 mg by mouth daily as needed (cramps).    . Multiple Vitamin (MULTIVITAMIN WITH MINERALS) TABS tablet Take 1 tablet by mouth every morning. ALIVE    . naproxen sodium (ALEVE) 220 MG tablet Take 220-440 mg by mouth 2 (two) times daily as needed (pain).    . quinapril (ACCUPRIL) 20 MG tablet Take 1 tablet (20 mg total) by mouth daily. (Patient taking differently: Take 20 mg by mouth every morning.) 90 tablet 0  . rosuvastatin (CRESTOR) 40 MG tablet Take 1 tablet (40 mg total) by mouth daily. (Patient taking differently: Take 40 mg by mouth every morning.) 90 tablet 0  . escitalopram (LEXAPRO) 10 MG tablet TAKE 1 TABLET BY MOUTH EVERY DAY 90 tablet 0  . traZODone (DESYREL) 100 MG tablet Take 200 mg by mouth at bedtime. (Patient  not taking: No sig reported)      Musculoskeletal: Strength & Muscle Tone: within normal limits Gait & Station: normal Patient leans: N/A   Psychiatric Specialty Exam:  Presentation  General Appearance: Appropriate for Environment  Eye Contact:Good  Speech:Clear and Coherent  Speech Volume:Normal  Handedness:Right  Mood and Affect  Mood:Anxious; Depressed  Affect:Congruent   Thought Process  Thought Processes:Disorganized  Descriptions of Associations:Intact  Orientation:Full (Time, Place and Person)  Thought Content:Logical  History of Schizophrenia/Schizoaffective disorder:No  Duration of Psychotic Symptoms:Greater than six months  Hallucinations:Hallucinations: None  Ideas of Reference:None  Suicidal Thoughts:Suicidal Thoughts: Yes, Active SI Active Intent and/or Plan: With Intent  Homicidal Thoughts:Homicidal Thoughts: No   Sensorium  Memory:Recent Good; Immediate Good  Judgment:Fair  Insight:Fair   Executive Functions  Concentration:Fair  Attention Span:Fair  [place]  Language:Good   Psychomotor Activity  Psychomotor Activity:Psychomotor Activity: Normal   Assets  Assets:Desire for Improvement; Financial Resources/Insurance   Sleep  Sleep:Sleep: Fair   Physical Exam: Physical Exam ROS Blood pressure 140/75, pulse (!) 47, temperature 98 F (36.7 C), temperature source Oral, resp. rate 14, height 5\' 7"  (1.702 m), weight 72.6 kg, SpO2 96 %. Body mass index is 25.06 kg/m.  Treatment Plan Summary: Daily contact with patient to assess and evaluate symptoms and progress in treatment and Medication management   Disposition: Recommend psychiatric Inpatient admission when medically cleared.    Discontinue Lexapro 10 mg due to elevated QTC  After intentional overdose Consider starting low dose Zoloft 25 mg po daily for depression CSW to continue seeking inpatient admission   Derrill Center,  NP 01/20/2021 11:37 AM

## 2021-01-20 NOTE — ED Provider Notes (Signed)
Emergency Medicine Observation Re-evaluation Note  Pamelyn Bancroft is a 65 y.o. female, seen on rounds today.  Pt initially presented to the ED for complaints of No chief complaint on file. Currently, the patient is calm, cooperative, resting and awaiting placement.  Physical Exam  BP 140/75   Pulse (!) 47   Temp 98 F (36.7 C) (Oral)   Resp 14   Ht 5\' 7"  (1.702 m)   Wt 72.6 kg   SpO2 96%   BMI 25.06 kg/m  Physical Exam General:   Cardiac: Regular rate Lungs: No respiratory distress Psych: Calm and cooperative  ED Course / MDM  EKG:EKG Interpretation  Date/Time:  Sunday January 18 2021 21:49:52 EDT Ventricular Rate:  66 PR Interval:  173 QRS Duration: 94 QT Interval:  509 QTC Calculation: 534 R Axis:   42 Text Interpretation: Sinus rhythm Prolonged QT interval Confirmed by Gerlene Fee [phone]) on 01/20/2021 8:42:32 AM   I have reviewed the labs performed to date as well as medications administered while in observation.  Recent changes in the last 24 hours include : None.  Plan  Current plan is for patient to be placed at a [facility] psych facility. Patient is not under full IVC at this time.   Varney Biles, MD 01/20/21 1121

## 2021-01-20 NOTE — BH Assessment (Signed)
Requested nursing to place TTS machine in patient's room for TTS re-assessment.

## 2021-01-21 DIAGNOSIS — I10 Essential (primary) hypertension: Secondary | ICD-10-CM | POA: Diagnosis not present

## 2021-01-21 DIAGNOSIS — F332 Major depressive disorder, recurrent severe without psychotic features: Secondary | ICD-10-CM | POA: Diagnosis not present

## 2021-01-21 DIAGNOSIS — F32A Depression, unspecified: Secondary | ICD-10-CM | POA: Diagnosis not present

## 2021-01-21 NOTE — BH Assessment (Signed)
[facility] Assessment Progress Note  Per Pecolia Ades, NP, this voluntary pt continues to require psychiatric hospitalization at this time.  The following facilities have been contacted to seek placement for this pt, with results as noted:  Beds available, information sent, decision pending: [facility] (re-referral)  Unable to reach: [facility] (left message at 14:44)  At capacity: [facility] (unit currently closed) [facility] (unit currently closed)  If this voluntary pt is accepted to a facility, please discuss disposition with pt to be sure that she agrees to the plan.  If a facility agrees to accept pt and the plan changes in any way please call the facility to inform them of the change.  Final disposition is pending as of this writing.   Jalene Mullet, [place] Coordinator [phone]

## 2021-01-21 NOTE — ED Provider Notes (Signed)
Emergency Medicine Observation Re-evaluation Note  Ellen [name] is a 65 y.o. female, seen on rounds today.  Pt initially presented to the ED for complaints of No chief complaint on file. Currently, the patient is sleeping comfortably.  Physical Exam  BP 118/75   Pulse 64   Temp 98.3 F (36.8 C) (Oral)   Resp 16   Ht 5\' 7"  (1.702 m)   Wt 72.6 kg   SpO2 97%   BMI 25.06 kg/m  Physical Exam General: Asleep, comfortable   ED Course / MDM  EKG:EKG Interpretation  Date/Time:  Monday January 19 2021 16:02:34 EDT Ventricular Rate:  60 PR Interval:  160 QRS Duration: 120 QT Interval:  499 QTC Calculation: 499 R Axis:   44 Text Interpretation: Sinus rhythm Prolonged QT Confirmed by Veryl Speak [phone]) on 01/21/2021 12:49:39 AM   I have reviewed the labs performed to date as well as medications administered while in observation.  Recent changes in the last 24 hours include no acute events reported by staff.  Plan  Current plan is for geri psych placement. Patient is not under full IVC at this time.   Valarie Merino, MD 01/21/21 [phone]

## 2021-01-21 NOTE — ED Notes (Signed)
Assisted Pt to shower

## 2021-01-22 ENCOUNTER — Telehealth (HOSPITAL_COMMUNITY): Payer: Self-pay | Admitting: Psychiatry

## 2021-01-22 DIAGNOSIS — I10 Essential (primary) hypertension: Secondary | ICD-10-CM | POA: Diagnosis not present

## 2021-01-22 DIAGNOSIS — F32A Depression, unspecified: Secondary | ICD-10-CM | POA: Diagnosis not present

## 2021-01-22 DIAGNOSIS — F332 Major depressive disorder, recurrent severe without psychotic features: Secondary | ICD-10-CM | POA: Diagnosis not present

## 2021-01-22 MED ORDER — CITALOPRAM HYDROBROMIDE 10 MG PO TABS: 10.0000 mg | ORAL_TABLET | Freq: Every day | ORAL | 1 refills | Status: DC

## 2021-01-22 MED ORDER — CITALOPRAM HYDROBROMIDE 10 MG PO TABS
10.0000 mg | ORAL_TABLET | Freq: Every day | ORAL | Status: DC
  Administered 2021-01-22: 10 mg via ORAL
  Filled 2021-01-22: qty 1

## 2021-01-22 NOTE — Discharge Instructions (Signed)
For your behavioral health needs, you are advised to follow up with the Mental Health Intensive Outpatient Program (MH-IOP) at the [facility] [place].  This program meets Monday - Friday from 9:00 am - 12:00 pm.  Due to Covid-19 this program is currently virtual.  You are scheduled for an intake appointment on Tuesday, January 27, 2021 at 9:00 am.  Please complete the registration packet given to you by Emergency Department staff and have it ready in time for the appointment.  If you have any questions, contact Dellia Nims, MEd at the phone number indicated below.  Velva Harman will be reaching out to you in the next couple days for orientation to the program and to answer any questions you may have:       [facility]. [employer]. [place], [place] [address]      Contact person: Dellia Nims, MEd      [phone]

## 2021-01-22 NOTE — Progress Notes (Signed)
Psych ED Discharge  01/22/2021 11:27 AM Meggin Ola  MRN:  [id number] HPI: Ellen [name] is a 65 y.o. female that presented to [place] on 01/18/2021. Pt intentionally overdosed on her trazodone prescription last night. Said she took "several hand fulls". Following the overdose she started to feel "very sick". Immediately, informed her wife and granddaughter what she had done.  Family called EMS. ED provider noted: "Upon questioning pt again, she says she should have dad that many pills because they should have been discontinued.  She had a  90 day prescription for trazodone 100 mg tablets".   Subjective:  65 year old female with history of depression, alcohol abuse, and recently diagnosed dementia. Patient was admitted after intentional overdose of Trazodone, with an intent to end her life. Patient is seen and assessed at this time by nurse practitioner, case discussed with Dr. Dwyane Dee. Patient admits to worsening depression symptoms, that has been poorly managed. She reports her stressors include pandemic,  being laid off by [employer] after 20 years, due to restructure, loss of her parents, boredom, and new diagnosis of dementia. SHe reports for majority of her lifetime she has been psychically active, "busy and has always worked". With her new diagnosis of dementia several of these things have been taken away from her to include driving and she no longer feels autonomous. Patient appears to be remorseful and identifies her recent suicide attempt as "selfish and stupid, I never thought about how it would make anyone else feel. I have so many people who love me. " Patient reports she is prescribed medications from her neurologist, in which she has been taking as directed. She describes some  symptoms of regret to include guilty, selfish. She appears to have motivation to improve, hopeful, engaged in her treatment plan and care, insight into her mental illness. We discussed dementia and how some of these symptoms  mimic depression, in which she agrees. She became tearful when talking about her parents, and reports she never really grieved in which her mothers death was followed by the pandemic. She reports recently completing alcohol rehab at [facility] and has not had a drink since. Further discussion with her includes her urine drug screen was positive for cocaine and amphetamines. In which she originally minimizes but states she usually keeps some around the house. We spoke with her about the side effects and complications these substances have on her mental health to include worsening suicidality. Se verbalizes understanding.   On evaluation patient is alert and oriented, calm and cooperative, very pleasant upon approach.  She is noted to recently been laid off due to restructure in pandemic, and retied [employer]. She resides at home with her spouse of 68 years and 48 year old grand daughter.  Patient denies any access to weapons "have a small pocket knife when we walk", denies any alcohol abuse. SHe admits to intermittent substance abuse of cocaine, which could also explain her symptoms of mood lability, forgetfulness, and agitation.  She reports moderate sleep and fair appetite.  She also is receiving services through [facility], and her spouse is available to assist with medication management in which she reports compliance with most of her appointments.  She denies any previous existing psychiatric history with the exception of polysubstance abuse and PTSD Geophysical data processor). Patient denies any auditory and/or visual hallucinations, does not appear to be responding to internal or external stimuli.  There is no evidence of delusional thought content and patient appears to answer all questions appropriately.  She denies any suicidal ideations at this time, and is able to contract for safety. SHe appears to be open to intensive outpatient program as a transition while programs are established through the [place]. At this  time patient appears to be stable to discharge home, with support system services in place.     Principal Problem: Suicidal overdose, initial encounter ([place]) Diagnosis: Principal Problem:   Suicidal overdose, initial encounter ([place])  Total Time spent with patient: 1 hour  Past Psychiatric History:   Past Medical History:  Past Medical History:  Diagnosis Date   Alcohol abuse    Depression    Hyperlipidemia    Hypertension    Seasonal allergies     Past Surgical History:  Procedure Laterality Date   ABDOMINAL HYSTERECTOMY  1998   Family History:  Family History  Problem Relation Age of Onset   High Cholesterol Mother    High blood pressure Mother    Diabetes Mother    High blood pressure Father    High Cholesterol Father    Diabetes Father    High blood pressure Brother    High Cholesterol Brother    Family Psychiatric  History: Father diagnosed with dementia in his 32s.  Social History:  Social History   Substance and Sexual Activity  Alcohol Use Not Currently     Social History   Substance and Sexual Activity  Drug Use Never    Social History   Socioeconomic History   Marital status: Married    Spouse name: Not on file   Number of children: Not on file   Years of education: 16   Highest education level: Not on file  Occupational History   Not on file  Tobacco Use   Smoking status: Former    Pack years: 0.00   Smokeless tobacco: Never  Vaping Use   Vaping Use: Never used  Substance and Sexual Activity   Alcohol use: Not Currently   Drug use: Never   Sexual activity: Not on file  Other Topics Concern   Not on file  Social History Narrative   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                    Sleep: Fair  Appetite:  Fair  Current Medications: Current Facility-Administered Medications  Medication Dose Route Frequency Provider Last Rate Last Admin   citalopram (CELEXA) tablet 10 mg  10 mg Oral Daily Suella Broad, FNP   10 mg at 01/22/21 1119   ezetimibe (ZETIA) tablet 10 mg  10 mg Oral Daily Lacretia Leigh, MD   10 mg at 01/22/21 0946   hydrochlorothiazide (HYDRODIURIL) tablet 25 mg  25 mg Oral Daily Lacretia Leigh, MD   25 mg at 01/22/21 0945   lactated ringers infusion   Intravenous Continuous Lacretia Leigh,  MD   Stopped at 01/19/21 1546   lisinopril (ZESTRIL) tablet 20 mg  20 mg Oral Daily Lacretia Leigh, MD   20 mg at 01/22/21 0946   rosuvastatin (CRESTOR) tablet 40 mg  40 mg Oral Daily Lacretia Leigh, MD   40 mg at 01/22/21 6599   Current Outpatient Medications  Medication Sig Dispense Refill   benztropine (COGENTIN) 1 MG tablet TAKE 1 TABLET BY MOUTH EVERY DAY (Patient taking differently: Take 1 mg by mouth every morning.) 90 tablet 1   donepezil (ARICEPT) 10 MG tablet Take 1/2 tablet daily for 2 weeks, then increase to 1 tablet daily (Patient taking differently: Take 10 mg by mouth every morning.) 30 tablet 11   ezetimibe (ZETIA) 10 MG tablet Take 1 tablet (10 mg total) by mouth daily. (Patient taking differently: Take 10 mg by mouth every morning.) 90 tablet 0   hydrochlorothiazide (HYDRODIURIL) 25 MG tablet Take 1 tablet (25 mg total) by mouth daily. (Patient taking differently: Take 25 mg by mouth every morning.) 90 tablet 0   Magnesium 250 MG TABS Take 250 mg by mouth daily as needed (cramps).     Multiple Vitamin  (MULTIVITAMIN WITH MINERALS) TABS tablet Take 1 tablet by mouth every morning. ALIVE     naproxen sodium (ALEVE) 220 MG tablet Take 220-440 mg by mouth 2 (two) times daily as needed (pain).     quinapril (ACCUPRIL) 20 MG tablet Take 1 tablet (20 mg total) by mouth daily. (Patient taking differently: Take 20 mg by mouth every morning.) 90 tablet 0   rosuvastatin (CRESTOR) 40 MG tablet Take 1 tablet (40 mg total) by mouth daily. (Patient taking differently: Take 40 mg by mouth every morning.) 90 tablet 0   citalopram (CELEXA) 10 MG tablet Take 1 tablet (10 mg total) by mouth daily. 30 tablet 1   traZODone (DESYREL) 100 MG tablet Take 200 mg by mouth at bedtime. (Patient not taking: No sig reported)      Lab Results: No results found for this or any previous visit (from the past 48 hour(s)).  Blood Alcohol level:  Lab Results  Component Value Date   ETH <10 35/70/1779    Metabolic Disorder Labs: Lab Results  Component Value Date   HGBA1C 6.3 06/21/2018   No results found for: PROLACTIN Lab Results  Component Value Date   CHOL 303 (H) 05/21/2020   TRIG 270 (H) 05/21/2020   HDL 40 (L) 05/21/2020   CHOLHDL 7.6 (H) 05/21/2020   VLDL 30.2 06/21/2018   LDLCALC 215 (H) 05/21/2020   LDLCALC 90 06/21/2018    Physical Findings: AIMS:  , ,  ,  ,    CIWA:    COWS:     Musculoskeletal: Strength & Muscle Tone: within normal limits Gait & Station: normal Patient leans:  N?A  Psychiatric Specialty Exam:  Presentation  General Appearance: Appropriate for Environment; Casual  Eye Contact:Good  Speech:Clear and Coherent; Normal Rate  Speech Volume:Normal  Handedness:Right   Mood and Affect  Mood:Euthymic  Affect:Appropriate; Congruent   Thought Process  Thought Processes:Coherent; Goal Directed; Linear  Descriptions of Associations:Circumstantial  Orientation:Full (Time, Place and Person)  Thought Content:Logical  History of Schizophrenia/Schizoaffective  disorder:No  Duration of Psychotic Symptoms:N/A  Hallucinations:Hallucinations: None  Ideas of Reference:None  Suicidal Thoughts:Suicidal Thoughts: No SI Active Intent and/or Plan: Without Plan; Without Access to Means; Without Means to Carry Out  Homicidal Thoughts:Homicidal Thoughts: No   Sensorium  Memory:Immediate Good; Recent Good; Remote Fair  Judgment:Good  Insight:Good   Executive Functions  Concentration:Good  Attention Span:Good  Butters of Knowledge:Good  Language:Good   Psychomotor Activity  Psychomotor Activity:Psychomotor Activity: Normal   Assets  Assets:Communication Skills; Financial Resources/Insurance; Talents/Skills; Social Support; Physical Health; Leisure Time   Sleep  Sleep:Sleep: Fair    Physical Exam: Physical Exam Vitals and nursing note reviewed.  Constitutional:      Appearance: Normal appearance. She is normal weight.  HENT:     Head: Normocephalic.  Musculoskeletal:     Cervical back: Normal range of motion.  Skin:    General: Skin is warm and dry.  Neurological:     General: No focal deficit present.     Mental Status: She is alert and oriented to person, place, and time.  Psychiatric:        Mood and Affect: Mood normal.        Behavior: Behavior normal.        Thought Content: Thought content normal.        Judgment: Judgment normal.   Review of Systems  Psychiatric/Behavioral: Negative.    All other systems reviewed and are negative. Blood pressure 132/78, pulse 65, temperature 98 F (36.7 C), temperature source Oral, resp. rate 20, height 5\' 7"  (1.702 m), weight 72.6 kg, SpO2 99 %. Body mass index is 25.06 kg/m.   Treatment Plan Summary: Plan Will start citalopram to further target depressive symptoms and dementia with behavioral disturbances. Patient is to discontinue Lexapro at this time. Patient presents with polysubstance abuse which can also contribute to her worsening depressive symptoms and  cognitive impairment. Patient will benefit from intensive outpatient programming, and likely can attend Substance abuse intensive outpatient programming following traditional IOP. She will need help for crisis stabilization, medication management, and cognitive behavioral therapy  Start anti depression medication, takes about 4-6 weeks, refrain from substances and follow up with neruo to determine if your MOCA scores have improved.   -Start Citalopram, Stop Escitalopram. Rx for Citalopram sent to [place].  She is going to follow up with Dr. Delice Lesch. She could benefit from a referral to neuropsychiatrist for evaluation of ECT/TMS, as she appears to be open to it. Also consider adding antipsychotic to augment depression medications. -Abstain from substance abuse at this time, as this is likely contributing to your worsening depressive symptoms and cognitive impairment.  -I note some minor lab abnormalities that have been stable over time, these are of doubtful clinical significance TSH primarily. Would recommend following up with Family medicine for TSH evaluation. -Appointment and transfer of care has been arranged with [facility], to start 06/14 @am . Program coordinator Dellia Nims is to follow up with patient to complete intake.      Suella Broad, FNP 01/22/2021, 11:27 AM

## 2021-01-22 NOTE — ED Provider Notes (Signed)
Patient cleared by behavioral health for discharge home and outpatient follow-up.  Patient was awaiting [facility] psych placement.  They have deemed that she is safe for discharge.  Patient initially seen for overdose and depression.  Patient medically cleared from the overdose.   Fredia Sorrow, MD 01/22/21 1130

## 2021-01-22 NOTE — ED Notes (Signed)
Pt belongings stored in [place] in the recreational room

## 2021-01-22 NOTE — Telephone Encounter (Signed)
D:  Dr. Sheran Fava referred pt to [facility].  A:  Placed call to orient pt since she is scheduled to start on 01-27-21 @ 9 a.m.  Pt inquired about all the other groups that are offered.  Pt states she will contact her insurance company (which is new to her) in order to verify her benefits.  Reports she will call case manager back on 01-26-21 re: MH-IOP.  Inform Sheran Fava.

## 2021-01-22 NOTE — BH Assessment (Signed)
[facility] Assessment Progress Note   Per Sheran Fava, DNP, this voluntary pt does not require psychiatric hospitalization at this time.  Pt is psychiatrically cleared.  Pt is recommended for MH-IOP at the [facility], which is currently virtual due to Covid-19.  I have spoken to pt; she agrees to enroll and reports that she has information technology in her home to support virtual programming.  Pt has been scheduled for an intake appointment on Tuesday, 01/27/2021 at 09:00.  This has been included in pt's discharge instructions.  Pt has provided current contact information, which I have sent to Dellia Nims, MEd via e-mail.  I have given pt a registration packet for the Outpatient Clinic and have instructed her to fill it out prior to the intake appointment.  EDP Fredia Sorrow, MD and pt's nurse, Eustaquio Maize, have been notified.  Jalene Mullet, [place] Triage Specialist [phone]

## 2021-01-22 NOTE — ED Notes (Signed)
Pt DC d off unit to home per provider. Pt alert, cooperative, calm, no s/s of distress. DC information given to and reviewed with pt, pt acknowledged understanding. Pt ambulatory off unit, escorted by NT. Pt transported by significant other.

## 2021-01-26 ENCOUNTER — Telehealth (HOSPITAL_COMMUNITY): Payer: Self-pay | Admitting: Psychiatry

## 2021-01-26 NOTE — Telephone Encounter (Signed)
D:  Since MH-IOP is currently a very large group this week (combined with PHP); pt will start on 02-02-21 instead.  A:  Pt to receive a group link on 02-02-21.  R:  Pt receptive.

## 2021-01-27 ENCOUNTER — Other Ambulatory Visit (HOSPITAL_COMMUNITY): Payer: Medicare Other | Attending: Family | Admitting: Psychiatry

## 2021-01-27 ENCOUNTER — Other Ambulatory Visit: Payer: Self-pay

## 2021-01-27 ENCOUNTER — Encounter (HOSPITAL_COMMUNITY): Payer: Self-pay | Admitting: Family

## 2021-01-27 DIAGNOSIS — F419 Anxiety disorder, unspecified: Secondary | ICD-10-CM | POA: Diagnosis not present

## 2021-01-27 DIAGNOSIS — F32A Depression, unspecified: Secondary | ICD-10-CM | POA: Diagnosis present

## 2021-01-27 NOTE — Progress Notes (Signed)
Virtual Visit via Video Note  I connected with Ellen [name] on 01/27/21 at  9:00 AM EDT by a video enabled telemedicine application and verified that I am speaking with the correct person using two identifiers.  At orientation to the IOP program, Case Manager discussed the limitations of evaluation and management by telemedicine and the availability of in person appointments. The patient expressed understanding and agreed to proceed with virtual visits throughout the duration of the program.   Location:  Patient: Patient Home Provider: Home Office   History of Present Illness: Anxiety and Depression  Observations/Objective: Check In: Case Manager checked in with all participants to review discharge dates, insurance authorizations, work-related documents and needs from the treatment team regarding medications. Client stated needs and engaged in discussion.   Initial Therapeutic Activity: Counselor facilitated a check-in with group members to assess mood and current functioning. Client shared details of their mental health management since our last session, including challenges and successes. Counselor engaged group in discussion, covering the following topics: substance abuse impacting mental health, work related stress, vocations that support mental health, financial stressors, setting boundaries, navigating stages of life, and updates on coping skills used since last session. Client presents with moderate depression and moderate anxiety. Client denied any current SI/HI/psychosis.  Second Therapeutic Activity: Counselor presented information on Cognitive Distortions. Client shared a video outlining 15 styles of cognitive distortions. Group members shared which ones they do most in their thought processes. Counselor shared 5 strategies for combating distorted thinking. Counselor shared links for resources for Group Members to review for homework.  Check Out: Counselor closed program by  allowing time to celebrate a graduating group member. Counselor shared reflections on progress and allow space for group members to share well wishes and encouragements with the graduating client. Counselor prompted graduating client to share takeaways, reflect on progress and final thoughts for the group. Counselor prompted group members to share what self-care practice or productivity activity they will engage in today. Group members shared their plans with the group. Client endorsed safety plan to be followed to prevent safety issues.   Assessment and Plan: Clinician recommends that Client remain in IOP treatment to better manage mental health symptoms, stabilization and to address treatment plan goals. Clinician recommends adherence to crisis/safety plan, taking medications as prescribed, and following up with medical professionals if any issues arise.    Follow Up Instructions: Clinician will send Webex link for next session. The Client was advised to call back or seek an in-person evaluation if the symptoms worsen or if the condition fails to improve as anticipated.     I provided 180 minutes of non-face-to-face time during this encounter.     Lise Auer, LCSW

## 2021-01-27 NOTE — Progress Notes (Signed)
Virtual Visit via Video Note  I connected with [name] on @TODAY @ at  9:00 AM EDT by a video enabled telemedicine application and verified that I am speaking with the correct person using two identifiers.  Location: Patient: at home Provider: at home office   I discussed the limitations of evaluation and management by telemedicine and the availability of in person appointments. The patient expressed understanding and agreed to proceed.   I discussed the assessment and treatment plan with the patient. The patient was provided an opportunity to ask questions and all were answered. The patient agreed with the plan and demonstrated an understanding of the instructions.   The patient was advised to call back or seek an in-person evaluation if the symptoms worsen or if the condition fails to improve as anticipated.  I provided 20 minutes of non-face-to-face time during this encounter.   Ellen [name], RITA, M.Ed, CNA   Patient ID: Ellen [name], female   DOB: October 24, 1955, 65 y.o.   MRN: [id number]  As per previous CCA states:  Pt is a 65 year old female who presents to [facility] ED accompanied by her significant other, Ellen [name] [phone], who participated in assessment at Pt's request. Pt says she has felt very depressed recently, stating "There are a lot of things going on in my world." Pt reports she has a history of depression and has been severely depressed for the past two weeks. Pt acknowledges that she ingested two handfuls of Trazodone yesterday in a suicide attempt. Pt attempted to take more pills today but experienced emesis. She denies any history of previous suicide attempts. Pt describes her mood recently as "unhappy." Pt acknowledges symptoms including crying spells, social withdrawal, loss of interest in usual pleasures, fatigue, irritability, decreased concentration, decreased sleep, decreased appetite and feelings of worthlessness and hopelessness. Pt denies any  history of intentional self-injurious behaviors. Pt denies current homicidal ideation or history of violence. Pt denies current auditory or visual hallucinations. Pt states she experienced hallucinations a few months ago of believing that people were running through the house. Pt reports she has a history of alcohol use but since going to a 30-day treatment center in 2019 has not drank alcohol. She denies any other substance use.   Pt has difficulty expressing her stressors. [name] states Pt has been diagnosed with dementia. She says in March Pt's neurologist prescribed Lexapro and Aricept and that Pt's symptoms improved. She says Pt was also prescribed Trazodone but that medication made Pt lethargic and sleep excessively. [name] says two weeks ago Pt's granddaughter came to live with them. She says this disrupted Pt's routine and that is when she noticed Pt's mood was more depressed.   Pt says she has three adult children, one she has a good relationship and two that are estranged. Pt has no history of abuse or trauma. Pt denies legal problems. She denies access to firearms. [name] says Pt will see her neurologist but has been reluctant to see a psychiatrist. Pt went to a 30-day substance abuse treatment program in [place] in 2019 but otherwise has not had any inpatient mental health treatment.    Patient started MH-IOP today.  Reports ongoing stressor of being diagnosed with dementia recently.  States she is glad to be in a group with other people.  Denies any SI/HI or A/V hallucinations.  Cc: previous CCA/chart for more hx.  A:  Oriented pt.  Pt gave verbal consent for tx to release chart information  to referred providers and to complete any forms if needed.  Pt also gave consent for attending group virtually d/t COVID-19 social distancing restrictions.  Encouraged support groups; especially one for dementia/Alzheimer's.  F/U with a therapist and psychiatrist.  R:  Patient receptive.  Dellia Nims, M.Ed,CNA

## 2021-01-27 NOTE — Progress Notes (Signed)
Virtual Visit via Video Note  I connected with Ellen [name] on 01/27/21 at  9:00 AM EDT by a video enabled telemedicine application and verified that I am speaking with the correct person using two identifiers.  Location: Patient: Home Provider: Office   I discussed the limitations of evaluation and management by telemedicine and the availability of in person appointments. The patient expressed understanding and agreed to proceed.    I discussed the assessment and treatment plan with the patient. The patient was provided an opportunity to ask questions and all were answered. The patient agreed with the plan and demonstrated an understanding of the instructions.   The patient was advised to call back or seek an in-person evaluation if the symptoms worsen or if the condition fails to improve as anticipated.  I provided 15 minutes of non-face-to-face time during this encounter.   Derrill Center, NP   Psychiatric Initial Adult Assessment   Patient Identification: Ellen [name] MRN:  [id number] Date of Evaluation:  01/27/2021 Referral Source: NP - [facility] Chief Complaint:  Depression, suicidal ideations Visit Diagnosis:    ICD-10-CM   1. Anxiety and depression  F41.9    F32.A       History of Present Illness:  Per admission assessment note: Ellen [name] is a 65 y.o. female that presented to [facility] on 01/18/2021. Pt intentionally overdosed on her trazodone prescription last night. Said she took "several hand fulls". Following the overdose she started to feel "very sick". Immediately, informed her wife and granddaughter what she had done.  Family called EMS. ED provider noted: "Upon questioning pt again, she says she should have dad that many pills because they should have been discontinued.  She had a  90 day prescription for trazodone 100 mg tablets".   Per discharge assessment note: Plan Will start citalopram to further target depressive symptoms and dementia with  behavioral disturbances. Patient is to discontinue Lexapro at this time. Patient presents with polysubstance abuse which can also contribute to her worsening depressive symptoms and cognitive impairment. Patient will benefit from intensive outpatient programming, and likely can attend Substance abuse intensive outpatient programming following traditional IOP. She will need help for crisis stabilization, medication management, and cognitive behavioral therapy  Start anti depression medication, takes about 4-6 weeks, refrain from substances and follow up with neruo to determine if your MOCA scores have improved.    -Start Citalopram, Stop Escitalopram. Rx for Citalopram sent to [facility]. She is going to follow up with Dr. Delice Lesch. She could benefit from a referral to neuropsychiatrist for evaluation of ECT/TMS, as she appears to be open to it. Also consider adding antipsychotic to augment depression medications.  -Abstain from substance abuse at this time, as this is likely contributing to your worsening depressive symptoms and cognitive impairment.  -I note some minor lab abnormalities that have been stable over time, these are of doubtful clinical significance TSH primarily. Would recommend following up with Family medicine for TSH evaluation.   Evaluation:  Ellen [name] was seen and evaluated by this NP on admission to the emergency department. Patient was initiated on Celexa 10 mg daily for mood stabilization. She continues to endorse depression symptoms, however is denying suicidal or homicidal ideations. As was stated she has a suicide attempt due to new diagnosis of dementia.  States her wife and her brother asked her to be evaluated.  Ellen [name] reported symptom of worries to the passing of her father who died from dementia diagnosis. Denied previous  inpatient or outpatient admission. Reported she was followed by her primary care provider for medication management.   Associated  Signs/Symptoms: Depression Symptoms:  depressed mood, feelings of worthlessness/guilt, difficulty concentrating, anxiety, (Hypo) Manic Symptoms:  Impulsivity, Irritable Mood, Anxiety Symptoms:  Excessive Worry, Psychotic Symptoms:   N/A PTSD Symptoms: NA  Past Psychiatric History:   Previous Psychotropic Medications: Yes   Substance Abuse History in the last 12 months:  No. Reported she has been sober since 2019 from [facility].   Consequences of Substance Abuse: NA  Past Medical History:  Past Medical History:  Diagnosis Date   Alcohol abuse    Depression    Hyperlipidemia    Hypertension    Seasonal allergies     Past Surgical History:  Procedure Laterality Date   ABDOMINAL HYSTERECTOMY  1998    Family Psychiatric History:   Family History:  Family History  Problem Relation Age of Onset   High Cholesterol Mother    High blood pressure Mother    Diabetes Mother    High blood pressure Father    High Cholesterol Father    Diabetes Father    High blood pressure Brother    High Cholesterol Brother     Social History:   Social History   Socioeconomic History   Marital status: Married    Spouse name: Not on file   Number of children: Not on file   Years of education: 16   Highest education level: Not on file  Occupational History   Not on file  Tobacco Use   Smoking status: Former    Pack years: 0.00   Smokeless tobacco: Never  Vaping Use   Vaping Use: Never used  Substance and Sexual Activity   Alcohol use: Not Currently   Drug use: Never   Sexual activity: Not on file  Other Topics Concern   Not on file  Social History Narrative   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    Married to partner    Right handed   Drinks caffeine   Two story home      Social Determinants of Health   Financial Resource Strain: Not on file  Food Insecurity: Not on file  Transportation Needs: Not on file  Physical Activity: Not on file  Stress: Not on  file  Social Connections: Not on file    Additional Social History:   Allergies:  No Known Allergies  Metabolic Disorder Labs: Lab Results  Component Value Date   HGBA1C 6.3 06/21/2018   No results found for: PROLACTIN Lab Results  Component Value Date   CHOL 303 (H) 05/21/2020   TRIG 270 (H) 05/21/2020   HDL 40 (L) 05/21/2020   CHOLHDL 7.6 (H) 05/21/2020   VLDL 30.2 06/21/2018   LDLCALC 215 (H) 05/21/2020   LDLCALC 90 06/21/2018   Lab Results  Component Value Date   TSH 0.47 05/21/2020    Therapeutic Level Labs: No results found for: LITHIUM No results found for: CBMZ No results found for: VALPROATE  Current Medications: Current Outpatient Medications  Medication Sig Dispense Refill   benztropine (COGENTIN) 1 MG tablet TAKE 1 TABLET BY MOUTH EVERY DAY (Patient taking differently: Take 1 mg by mouth every morning.) 90 tablet 1   citalopram (CELEXA) 10 MG tablet Take 1 tablet (10 mg total) by mouth daily. 30 tablet 1   donepezil (ARICEPT) 10 MG tablet Take 1/2 tablet daily for 2 weeks, then increase to 1 tablet daily (Patient taking  differently: Take 10 mg by mouth every morning.) 30 tablet 11   ezetimibe (ZETIA) 10 MG tablet Take 1 tablet (10 mg total) by mouth daily. (Patient taking differently: Take 10 mg by mouth every morning.) 90 tablet 0   hydrochlorothiazide (HYDRODIURIL) 25 MG tablet Take 1 tablet (25 mg total) by mouth daily. (Patient taking differently: Take 25 mg by mouth every morning.) 90 tablet 0   Magnesium 250 MG TABS Take 250 mg by mouth daily as needed (cramps).     Multiple Vitamin (MULTIVITAMIN WITH MINERALS) TABS tablet Take 1 tablet by mouth every morning. ALIVE     naproxen sodium (ALEVE) 220 MG tablet Take 220-440 mg by mouth 2 (two) times daily as needed (pain).     quinapril (ACCUPRIL) 20 MG tablet Take 1 tablet (20 mg total) by mouth daily. (Patient taking differently: Take 20 mg by mouth every morning.) 90 tablet 0   rosuvastatin (CRESTOR) 40  MG tablet Take 1 tablet (40 mg total) by mouth daily. (Patient taking differently: Take 40 mg by mouth every morning.) 90 tablet 0   traZODone (DESYREL) 100 MG tablet Take 200 mg by mouth at bedtime. (Patient not taking: No sig reported)     No current facility-administered medications for this visit.    Musculoskeletal: Strength & Muscle Tone: within normal limits Gait & Station: normal Patient leans: N/A  Psychiatric Specialty Exam: Review of Systems  There were no vitals taken for this visit.There is no height or weight on file to calculate BMI.  General Appearance: Casual  Eye Contact:  Good  Speech:  Clear and Coherent  Volume:  Normal  Mood:  Anxious and Depressed  Affect:  Congruent  Thought Process:  Coherent  Orientation:  Full (Time, Place, and Person)  Thought Content:  Logical  Suicidal Thoughts:  No  Homicidal Thoughts:  No  Memory:  Immediate;   Fair Recent;   Good  Judgement:  Fair  Insight:  Fair  Psychomotor Activity:  Normal  Concentration:  Concentration: Fair  Recall:  [employer] of [place]: Good  Akathisia:  No  Handed:  Right  AIMS (if indicated):  done  Assets:  Intimacy Physical Health Social Support  ADL's:  Intact  Cognition: WNL  Sleep:  Good   Screenings: PHQ2-9    [facility] Visit from 05/21/2020 in [facility]  PHQ-2 Total Score 0  PHQ-9 Total Score 0      Flowsheet Row ED from 01/18/2021 in [facility] DEPT  C-SSRS RISK CATEGORY Error: Q7 should not be populated when Q6 is No       Assessment and Plan:  Ellen [name] to start Intensive Outpatient programming Continue Medications as directed  Treatment plan was reviewed and agreed upon by NP T.Bobby Rumpf and patient Ellen [name] need for group services    Derrill Center, NP 6/14/202212:39 PM

## 2021-01-28 ENCOUNTER — Other Ambulatory Visit (HOSPITAL_COMMUNITY): Payer: Medicare Other | Admitting: Psychiatry

## 2021-01-28 ENCOUNTER — Other Ambulatory Visit: Payer: Self-pay

## 2021-01-28 ENCOUNTER — Encounter (HOSPITAL_COMMUNITY): Payer: Self-pay

## 2021-01-28 DIAGNOSIS — F419 Anxiety disorder, unspecified: Secondary | ICD-10-CM

## 2021-01-28 NOTE — Progress Notes (Addendum)
Virtual Visit via Video Note  I connected with [name] on 01/28/21 at  9:00 AM EDT by a video enabled telemedicine application and verified that I am speaking with the correct person using two identifiers.  At orientation to the IOP program, Case Manager discussed the limitations of evaluation and management by telemedicine and the availability of in person appointments. The patient expressed understanding and agreed to proceed with virtual visits throughout the duration of the program.   Location:  Patient: Patient Home Provider: Home Office   History of Present Illness: Anxiety and Depression  Observations/Objective: Check In: Case Manager checked in with all participants to review discharge dates, insurance authorizations, work-related documents and needs from the treatment team regarding medications. Client stated needs and engaged in discussion. Client presents with moderate depression and moderate anxiety. Client denied any current SI/HI/psychosis.  Initial Therapeutic Activity: Counselor introduced our guest speaker, Einar Grad, [facility] Pharmacist, who shared about psychiatric medications, side effects, treatment considerations and how to communicate with medical professionals. Group Members asked questions and shared medication concerns. Counselor prompted group members to reference a worksheet called, "Body Scan" to jot down questions and concerns about their physical health in preparation for their upcoming appointments with medical professionals. Counselor encouraged routine medical check-ups, preparing for appointments, following up with recommendations and seeking specialist if needed.  Second Therapeutic Activity: Counselor introduced Agricultural consultant, Demetra Shiner, Director of Wellness to present information on [employer] and Benefits. Clients engaged in activities and discussion, personalizing the information to their individual circumstances. Client motivated to  make small changes in wellness practices to improve overall mental health.  Check Out: Counselor prompted group members to share what self-care practice or productivity activity they will engage in today. Group members shared their plans with the group. Client endorsed safety plan to be followed to prevent safety issues.   Assessment and Plan: Clinician recommends that Client remain in IOP treatment to better manage mental health symptoms, stabilization and to address treatment plan goals. Clinician recommends adherence to crisis/safety plan, taking medications as prescribed, and following up with medical professionals if any issues arise.    Follow Up Instructions: Clinician will send Webex link for next session. The Client was advised to call back or seek an in-person evaluation if the symptoms worsen or if the condition fails to improve as anticipated.     I provided 180 minutes of non-face-to-face time during this encounter.     Lise Auer, LCSW

## 2021-01-29 ENCOUNTER — Other Ambulatory Visit: Payer: Self-pay

## 2021-01-29 ENCOUNTER — Other Ambulatory Visit (HOSPITAL_COMMUNITY): Payer: Medicare Other | Admitting: Psychiatry

## 2021-01-29 ENCOUNTER — Encounter (HOSPITAL_COMMUNITY): Payer: Self-pay

## 2021-01-29 DIAGNOSIS — F419 Anxiety disorder, unspecified: Secondary | ICD-10-CM | POA: Diagnosis not present

## 2021-01-29 NOTE — Progress Notes (Signed)
Virtual Visit via Video Note  I connected with [name] on 01/29/21 at  9:00 AM EDT by a video enabled telemedicine application and verified that I am speaking with the correct person using two identifiers.  At orientation to the IOP program, Case Manager discussed the limitations of evaluation and management by telemedicine and the availability of in person appointments. The patient expressed understanding and agreed to proceed with virtual visits throughout the duration of the program.   Location:  Patient: Patient Home Provider: Home Office   History of Present Illness: Anxiety and Depression  Observations/Objective: Check In: Case Manager checked in with all participants to review discharge dates, insurance authorizations, work-related documents and needs from the treatment team regarding medications. Client stated needs and engaged in discussion.   Initial Therapeutic Activity: Counselor introduced Rolin Barry, [place] Chaplain to present information and discussion on Grief and Loss. Group members engaged in discussion, sharing how grief impacts them, what comforts them, what emotions are felt, labeling losses, etc. After guest speaker logged off, Counselor prompted group to spend 15 minutes journaling to process personal grief and loss situations. Counselor processed entries with group and client's identified areas for additional processing in individual therapy.   Second Therapeutic Activity: Counselor facilitated a check-in with group members to assess mood and current functioning. Client shared details of their mental health management since our last session, including challenges and successes. Counselor engaged group in discussion, covering the following topics: health and wellness goals, community resources, distraction activities, reducing unhealthy habits, self-care, and structuring day for wellness. Client presents with moderate depression and moderate anxiety. Client  denied any current SI/HI/psychosis.  Check Out: Counselor prompted group members to share what self-care practice or productivity activity they will engage in today. Group members shared their plans with the group. Client endorsed safety plan to be followed to prevent safety issues.   Assessment and Plan: Clinician recommends that Client remain in IOP treatment to better manage mental health symptoms, stabilization and to address treatment plan goals. Clinician recommends adherence to crisis/safety plan, taking medications as prescribed, and following up with medical professionals if any issues arise.    Follow Up Instructions: Clinician will send Webex link for next session. The Client was advised to call back or seek an in-person evaluation if the symptoms worsen or if the condition fails to improve as anticipated.     I provided 180 minutes of non-face-to-face time during this encounter.     Lise Auer, LCSW

## 2021-01-30 ENCOUNTER — Telehealth (HOSPITAL_COMMUNITY): Payer: Self-pay | Admitting: Psychiatry

## 2021-01-30 ENCOUNTER — Other Ambulatory Visit (HOSPITAL_COMMUNITY): Payer: Medicare Other | Admitting: Psychiatry

## 2021-01-30 ENCOUNTER — Encounter (HOSPITAL_COMMUNITY): Payer: Self-pay | Admitting: Psychiatry

## 2021-01-30 ENCOUNTER — Other Ambulatory Visit: Payer: Self-pay

## 2021-01-30 DIAGNOSIS — F32A Depression, unspecified: Secondary | ICD-10-CM

## 2021-01-30 DIAGNOSIS — F419 Anxiety disorder, unspecified: Secondary | ICD-10-CM | POA: Diagnosis not present

## 2021-01-30 NOTE — Telephone Encounter (Signed)
D:  Pt reported in [place] check in that she's not doing well this morning.  "I relapsed lastnight."  A:  Placed call to talk to pt, but there was no answer.  Will attempt to call again within the hour.  Inform Ricky Ala, NP.

## 2021-01-30 NOTE — Telephone Encounter (Signed)
D:  At 1910, placed another call to pt; but there was no answer.  Left vm that case manager has been trying to reach her all day.  Left vm encouraging pt to seek treatment (ie. [facility] (TTS) or nearest ED if feeling unsafe.  Strongly recommended finding a local AA mtg since relapsing on ETOH lastnight.  A:  Will discuss CD-IOP with pt on 02-02-21.  Inform Ricky Ala, NP.

## 2021-01-30 NOTE — Progress Notes (Signed)
Virtual Visit via Video Note  I connected with Ellen [name] on 01/30/21 at  9:00 AM EDT by a video enabled telemedicine application and verified that I am speaking with the correct person using two identifiers.  At orientation to the IOP program, Case Manager discussed the limitations of evaluation and management by telemedicine and the availability of in person appointments. The patient expressed understanding and agreed to proceed with virtual visits throughout the duration of the program.   Location:  Patient: Patient Home Provider: Home Office   History of Present Illness: Anxiety and Depression  Observations/Objective: Check In: Case Manager checked in with all participants to review discharge dates, insurance authorizations, work-related documents and needs from the treatment team regarding medications. Client stated needs and engaged in discussion.   Initial Therapeutic Activity: Counselor facilitated a check-in with group members to assess mood and current functioning. Client shared details of their mental health management since our last session, including challenges and successes. Counselor engaged group in discussion, covering the following topics: sleep hygiene, utilization of coping skills, meditation, mindful walks, yoga, accountability, talking with providers, self-care guilt, ECT vs [place], substance use and its impact on mental health, and setting boundaries. Client presents with severe depression and moderate anxiety. Client denied any current SI/HI/psychosis.   Second Therapeutic Activity: Counselor opened discussion and explored challenges and thoughts around Father's Day/relationship with fathers. Group members took time to journal thoughts and feelings. Counselor prompted group members to process aloud, discussing ways to manage grief and loss, celebrate relationships and attend to feelings/memories that arise.   Check Out: Counselor prompted group members to share  what self-care practice or productivity activity they will engage in over the weekend. Group members shared their plans with the group. Client endorsed safety plan to be followed to prevent safety issues.   Assessment and Plan: Clinician recommends that Client remain in IOP treatment to better manage mental health symptoms, stabilization and to address treatment plan goals. Clinician recommends adherence to crisis/safety plan, taking medications as prescribed, and following up with medical professionals if any issues arise.    Follow Up Instructions: Clinician will send Webex link for next session. The Client was advised to call back or seek an in-person evaluation if the symptoms worsen or if the condition fails to improve as anticipated.     I provided 180 minutes of non-face-to-face time during this encounter.     Lise Auer, LCSW

## 2021-02-02 ENCOUNTER — Telehealth (HOSPITAL_COMMUNITY): Payer: Self-pay | Admitting: Psychiatry

## 2021-02-02 ENCOUNTER — Ambulatory Visit (HOSPITAL_COMMUNITY): Payer: Self-pay | Admitting: Psychiatry

## 2021-02-02 DIAGNOSIS — Z789 Other specified health status: Secondary | ICD-10-CM | POA: Diagnosis not present

## 2021-02-02 NOTE — Telephone Encounter (Signed)
D:  Pt left vm thanking cm for calling her multiple times on 01-30-21.  Pt states she is fine.  "I didn't relapse on ETOH.  It's just I felt overwhelmed with everything going on at the house."  According to pt, she got her granddaughter situated now.  "She is safe and I am also now." Pt states she wouldn't be attending MH-IOP today d/t an appt to try to get her license back.  A:  Inform treatment team.

## 2021-02-03 ENCOUNTER — Other Ambulatory Visit (HOSPITAL_COMMUNITY): Payer: Medicare Other | Admitting: Psychiatry

## 2021-02-03 ENCOUNTER — Other Ambulatory Visit: Payer: Self-pay

## 2021-02-03 DIAGNOSIS — F32A Depression, unspecified: Secondary | ICD-10-CM

## 2021-02-03 DIAGNOSIS — F419 Anxiety disorder, unspecified: Secondary | ICD-10-CM | POA: Diagnosis not present

## 2021-02-04 ENCOUNTER — Other Ambulatory Visit (HOSPITAL_COMMUNITY): Payer: Medicare Other | Admitting: Psychiatry

## 2021-02-04 ENCOUNTER — Other Ambulatory Visit: Payer: Self-pay

## 2021-02-04 ENCOUNTER — Encounter (HOSPITAL_COMMUNITY): Payer: Self-pay

## 2021-02-04 DIAGNOSIS — F419 Anxiety disorder, unspecified: Secondary | ICD-10-CM | POA: Diagnosis not present

## 2021-02-04 DIAGNOSIS — F32A Depression, unspecified: Secondary | ICD-10-CM

## 2021-02-04 NOTE — Progress Notes (Signed)
Virtual Visit via Video Note  I connected with Ellen [name] on 02/03/21 at  9:00 AM EDT by a video enabled telemedicine application and verified that I am speaking with the correct person using two identifiers.  At orientation to the IOP program, Case Manager discussed the limitations of evaluation and management by telemedicine and the availability of in person appointments. The patient expressed understanding and agreed to proceed with virtual visits throughout the duration of the program.   Location:  Patient: Patient Home Provider: Home Office   History of Present Illness: Anxiety and Depression  Observations/Objective: Check In: Case Manager checked in with all participants to review discharge dates, insurance authorizations, work-related documents and needs from the treatment team regarding medications. Client stated needs and engaged in discussion.   Initial Therapeutic Activity: Counselor facilitated a check-in with group members to assess mood and current functioning. Client shared details of their mental health management since our last session, including challenges and successes. Counselor engaged group in discussion, covering the following topics: self-care, assertive communication skills, balancing wellness, forming healthy habits, trauma vs paranoia and skill application. Client presents with moderate depression and moderate anxiety. Client denied any current SI/HI/psychosis.   Second Therapeutic Activity: Counselor introduced the group to [employer] 5 second concept for reducing overthinking to spark action, as well as the Morning High 5 concept. Counselor discussed challenges in overthinking and how to apply the skills and concepts. Group members discussed how this would look practically in their daily lives, challenging themselves to focus on action steps, not the thought.   Check Out: Counselor prompted group members to share what self-care practice or productivity  activity they will engage in today. Group members shared their plans with the group. Client endorsed safety plan to be followed to prevent safety issues.   Assessment and Plan: Clinician recommends that Client remain in IOP treatment to better manage mental health symptoms, stabilization and to address treatment plan goals. Clinician recommends adherence to crisis/safety plan, taking medications as prescribed, and following up with medical professionals if any issues arise.    Follow Up Instructions: Clinician will send Webex link for next session. The Client was advised to call back or seek an in-person evaluation if the symptoms worsen or if the condition fails to improve as anticipated.     I provided 180 minutes of non-face-to-face time during this encounter.     Lise Auer, LCSW

## 2021-02-04 NOTE — Progress Notes (Signed)
Virtual Visit via Video Note  I connected with Ellen [name] on 02/04/21 at  9:00 AM EDT by a video enabled telemedicine application and verified that I am speaking with the correct person using two identifiers. At orientation to the IOP program, Case Manager discussed the limitations of evaluation and management by telemedicine and the availability of in person appointments. The patient expressed understanding and agreed to proceed with virtual visits throughout the duration of the program.   Location:  Patient: Patient Home Provider: Home Office   History of Present Illness: Anxiety and Depression  Observations/Objective: Check In: Case Manager checked in with all participants to review discharge dates, insurance authorizations, work-related documents and needs from the treatment team regarding medications. Client stated needs and engaged in discussion.   Initial Therapeutic Activity: Counselor facilitated a check-in with group members to assess mood and current functioning. Client shared details of their mental health management since our last session, including challenges and successes. Counselor engaged group in discussion, covering the following topics: healthy emotional releases, discharge planning, utilization of coping skills, crafting to process emotions, rebuilding support system and making new connections in the community. Client presents with moderate depression and moderate anxiety. Client denied any current SI/HI/psychosis.   Second Therapeutic Activity: Counselor introduced quest speaker, Franco Collet, who presented information on self-care practices and implementation. Group members engaged in discussion and made plans for improving or enhancing their self-care routines.   Check Out: Counselor prompted group members to share what self-care practice or productivity activity they will engage in today. Group members shared their plans with the group. Client endorsed  safety plan to be followed to prevent safety issues.   Assessment and Plan: Clinician recommends that Client remain in IOP treatment to better manage mental health symptoms, stabilization and to address treatment plan goals. Clinician recommends adherence to crisis/safety plan, taking medications as prescribed, and following up with medical professionals if any issues arise.    Follow Up Instructions: Clinician will send Webex link for next session. The Client was advised to call back or seek an in-person evaluation if the symptoms worsen or if the condition fails to improve as anticipated.     I provided 180 minutes of non-face-to-face time during this encounter.     Lise Auer, LCSW

## 2021-02-05 ENCOUNTER — Other Ambulatory Visit (HOSPITAL_COMMUNITY): Payer: Medicare Other | Admitting: Psychiatry

## 2021-02-05 ENCOUNTER — Other Ambulatory Visit: Payer: Self-pay

## 2021-02-05 ENCOUNTER — Encounter (HOSPITAL_COMMUNITY): Payer: Self-pay

## 2021-02-05 DIAGNOSIS — F419 Anxiety disorder, unspecified: Secondary | ICD-10-CM | POA: Diagnosis not present

## 2021-02-05 DIAGNOSIS — F32A Depression, unspecified: Secondary | ICD-10-CM

## 2021-02-05 NOTE — Progress Notes (Signed)
Virtual Visit via Video Note  I connected with Ellen [name] on 02/05/21 at  9:00 AM EDT by a video enabled telemedicine application and verified that I am speaking with the correct person using two identifiers.  At orientation to the IOP program, Case Manager discussed the limitations of evaluation and management by telemedicine and the availability of in person appointments. The patient expressed understanding and agreed to proceed with virtual visits throughout the duration of the program.   Location:  Patient: Patient Home Provider: Home Office   History of Present Illness: Depression and Anxiety  Observations/Objective: Check In: Case Manager checked in with all participants to review discharge dates, insurance authorizations, work-related documents and needs from the treatment team regarding medications. Client stated needs and engaged in discussion. Counselor engaged group in discussion, covering the following topics: community resources, jobs that are [place] accommodating, managing to do lists and life transitions. Client presents with moderate depression and moderate anxiety. Client denied any current SI/HI/psychosis.  Initial Therapeutic Activity: Counselor introduced Rolin Barry, [place] Chaplain to present information and discussion on Grief and Loss. Group members engaged in discussion, sharing how grief impacts them, what comforts them, what emotions are felt, labeling losses, etc. After guest speaker logged off, Counselor prompted group to spend 10-15 minutes journaling to process personal grief and loss situations. Counselor processed entries with group and client's identified areas for additional processing in individual therapy. Client noted their individual issues of grief and how they are coping.   Second Therapeutic Activity: Counselor introduced [employer], representative with [employer] to share about programming. Group Members asked questions and engaged in  discussion, as Cristie Hem shared about Peer Support, Support Groups and the [employer]. Client stated that they are interested in connecting with the peer support.  Check Out: Counselor closed program by allowing time to celebrate two graduating group members. Counselor shared reflections on progress and allow space for group members to share well wishes and encouragements with the graduating client. Counselor prompted graduating client to share takeaways, reflect on progress and final thoughts for the group. Client endorsed safety plan to be followed to prevent safety issues.   Assessment and Plan: Clinician recommends that Client remain in IOP treatment to better manage mental health symptoms, stabilization and to address treatment plan goals. Clinician recommends adherence to crisis/safety plan, taking medications as prescribed, and following up with medical professionals if any issues arise.    Follow Up Instructions: Clinician will send Webex link for next session. The Client was advised to call back or seek an in-person evaluation if the symptoms worsen or if the condition fails to improve as anticipated.     I provided 180 minutes of non-face-to-face time during this encounter.     Lise Auer, LCSW

## 2021-02-06 ENCOUNTER — Other Ambulatory Visit (HOSPITAL_COMMUNITY): Payer: Medicare Other | Admitting: Psychiatry

## 2021-02-06 ENCOUNTER — Encounter (HOSPITAL_COMMUNITY): Payer: Self-pay

## 2021-02-06 ENCOUNTER — Other Ambulatory Visit: Payer: Self-pay

## 2021-02-06 DIAGNOSIS — F419 Anxiety disorder, unspecified: Secondary | ICD-10-CM | POA: Diagnosis not present

## 2021-02-06 DIAGNOSIS — F32A Depression, unspecified: Secondary | ICD-10-CM

## 2021-02-06 NOTE — Progress Notes (Signed)
Virtual Visit via Video Note  I connected with Ellen [name] on 02/06/21 at  9:00 AM EDT by a video enabled telemedicine application and verified that I am speaking with the correct person using two identifiers.  At orientation to the IOP program, Case Manager discussed the limitations of evaluation and management by telemedicine and the availability of in person appointments. The patient expressed understanding and agreed to proceed with virtual visits throughout the duration of the program.   Location:  Patient: Patient Home Provider: Home Office   History of Present Illness: Anxiety and Depression  Observations/Objective: Check In: Case Manager checked in with all participants to review discharge dates, insurance authorizations, work-related documents and needs from the treatment team regarding medications. Client stated needs and engaged in discussion.   Initial Therapeutic Activity: Counselor facilitated a check-in with group members to assess mood and current functioning. Client shared details of their mental health management since our last session, including challenges and successes. Counselor engaged group in discussion, covering the following topics: preparing to return to work, identifying distraction techniques, how COVID has impacted MH/life, disclosing MH to employers, self-expression and distress tolerance. Client presents with mild depression and moderate anxiety. Client denied any current SI/HI/psychosis.  Check Out: Counselor prompted group members to identify one self-care practice or productivity activity they would like to engage in over the weekend. Client plans to spend time cooking with granddaughter and exercising. Client endorsed safety plan to be followed to prevent safety issues.   Assessment and Plan: Clinician recommends that Client remain in IOP treatment to better manage mental health symptoms, stabilization and to address treatment plan goals. Clinician  recommends adherence to crisis/safety plan, taking medications as prescribed, and following up with medical professionals if any issues arise.    Follow Up Instructions: Clinician will send Webex link for next session. The Client was advised to call back or seek an in-person evaluation if the symptoms worsen or if the condition fails to improve as anticipated.     I provided 150 minutes of non-face-to-face time during this encounter.     Lise Auer, LCSW

## 2021-02-09 ENCOUNTER — Other Ambulatory Visit: Payer: Self-pay

## 2021-02-09 ENCOUNTER — Other Ambulatory Visit (HOSPITAL_COMMUNITY): Payer: Medicare Other | Admitting: Psychiatry

## 2021-02-09 DIAGNOSIS — F419 Anxiety disorder, unspecified: Secondary | ICD-10-CM

## 2021-02-10 ENCOUNTER — Other Ambulatory Visit: Payer: Self-pay

## 2021-02-10 ENCOUNTER — Other Ambulatory Visit (HOSPITAL_COMMUNITY): Payer: Medicare Other | Admitting: Psychiatry

## 2021-02-10 ENCOUNTER — Encounter (HOSPITAL_COMMUNITY): Payer: Self-pay

## 2021-02-10 DIAGNOSIS — F419 Anxiety disorder, unspecified: Secondary | ICD-10-CM | POA: Diagnosis not present

## 2021-02-10 NOTE — Progress Notes (Signed)
Virtual Visit via Video Note  I connected with [name] on 02/10/21 at  9:00 AM EDT by a video enabled telemedicine application and verified that I am speaking with the correct person using two identifiers.  At orientation to the IOP program, Case Manager discussed the limitations of evaluation and management by telemedicine and the availability of in person appointments. The patient expressed understanding and agreed to proceed with virtual visits throughout the duration of the program.   Location:  Patient: Patient Home Provider: Home Office   History of Present Illness: Anxiety and Depression  Observations/Objective: Check In: Case Manager checked in with all participants to review discharge dates, insurance authorizations, work-related documents and needs from the treatment team regarding medications. Client stated needs and engaged in discussion.   Initial Therapeutic Activity: Counselor facilitated a check-in with group members to assess mood and current functioning. Client shared details of their mental health management since our last session, including challenges and successes. Counselor engaged group in discussion, covering the following topics: safety issues and concerns in environment, trauma triggers and responses, and the benefit of having a pet or plant to care for as coping. Client presents with mild depression and moderate anxiety. Client denied any current SI/HI/psychosis.  Second Therapeutic Activity: Counselor facilitated the creation of a [place] with group members. Counselor walked them through each of the 6 steps, sharing examples for each section, such as warning signs, coping skills, supports, professionals, safety considerations and reasons for living. Group members shared their responses, to open up ideas for others and to get feedback. Counselor promoted group members share their plans with their providers and their support system, to  enhance and secure adherence to prevent a mental health crisis.   Check Out: Counselor prompted group members to identify one self-care practice or productivity activity they would like to engage in today. Client shared plans with the group and is feeling hopeful about their day. Client endorsed safety plan to be followed to prevent safety issues.   Assessment and Plan: Clinician recommends that Client remain in IOP treatment to better manage mental health symptoms, stabilization and to address treatment plan goals. Clinician recommends adherence to crisis/safety plan, taking medications as prescribed, and following up with medical professionals if any issues arise.    Follow Up Instructions: Clinician will send Webex link for next session. The Client was advised to call back or seek an in-person evaluation if the symptoms worsen or if the condition fails to improve as anticipated.     I provided 180 minutes of non-face-to-face time during this encounter.     Lise Auer, LCSW

## 2021-02-10 NOTE — Progress Notes (Signed)
Virtual Visit via Video Note  I connected with Ellen [name] n 02/09/21 at  9:00 AM EDT by a video enabled telemedicine application and verified that I am speaking with the correct person using two identifiers.  At orientation to the IOP program, Case Manager discussed the limitations of evaluation and management by telemedicine and the availability of in person appointments. The patient expressed understanding and agreed to proceed with virtual visits throughout the duration of the program.   Location:  Patient: Patient Home Provider: Home Office   History of Present Illness: Anxiety and Depression  Observations/Objective: Check In: Case Manager checked in with all participants to review discharge dates, insurance authorizations, work-related documents and needs from the treatment team regarding medications. Client stated needs and engaged in discussion.   Initial Therapeutic Activity: Counselor facilitated a check-in with group members to assess mood and current functioning. Client shared details of their mental health management since our last session, including challenges and successes. Counselor engaged group in discussion, covering the following topics: cognitive coping skills, boundary setting, assertive communication, managing long term legal issues, benefits of faith communities, trauma-based counseling, and grief and loss work. Client presents with mild depression and moderate anxiety. Client denied any current SI/HI/psychosis.  Second Therapeutic Activity: Counselor presented 2 psychoeducational videos on cognitive coping and listening to [place] DBT skill. Counselor reviewed primary points with group, processing their thoughts on the concepts. The Counselor challenged group on practical skill application in their own though patterns and in conversations with others. Client engaged well in discussion and in making a plan for skill application.  Check Out: Counselor prompted  group members to identify one self-care practice or productivity activity they would like to engage in today. Client plans to take a nap and clean her home. Client endorsed safety plan to be followed to prevent safety issues.   Assessment and Plan: Clinician recommends that Client remain in IOP treatment to better manage mental health symptoms, stabilization and to address treatment plan goals. Clinician recommends adherence to crisis/safety plan, taking medications as prescribed, and following up with medical professionals if any issues arise.    Follow Up Instructions: Clinician will send Webex link for next session. The Client was advised to call back or seek an in-person evaluation if the symptoms worsen or if the condition fails to improve as anticipated.     I provided 150 minutes of non-face-to-face time during this encounter.     Lise Auer, LCSW

## 2021-02-11 ENCOUNTER — Other Ambulatory Visit (HOSPITAL_COMMUNITY): Payer: Medicare Other | Admitting: Psychiatry

## 2021-02-11 ENCOUNTER — Encounter (HOSPITAL_COMMUNITY): Payer: Self-pay

## 2021-02-11 ENCOUNTER — Other Ambulatory Visit: Payer: Self-pay

## 2021-02-11 DIAGNOSIS — F419 Anxiety disorder, unspecified: Secondary | ICD-10-CM | POA: Diagnosis not present

## 2021-02-11 NOTE — Progress Notes (Signed)
Virtual Visit via Video Note  I connected with Ellen [name] on 02/11/21 at  9:00 AM EDT by a video enabled telemedicine application and verified that I am speaking with the correct person using two identifiers.  At orientation to the IOP program, Case Manager discussed the limitations of evaluation and management by telemedicine and the availability of in person appointments. The patient expressed understanding and agreed to proceed with virtual visits throughout the duration of the program.   Location:  Patient: Patient Home Provider: Home Office   History of Present Illness: Anxiety and Depression  Observations/Objective: Check In: Case Manager checked in with all participants to review discharge dates, insurance authorizations, work-related documents and needs from the treatment team regarding medications. Client stated needs and engaged in discussion. Client presents with mild depression and moderate anxiety. Client denied any current SI/HI/psychosis.  Initial Therapeutic Activity: Counselor introduced our guest speaker, Einar Grad, [facility] Pharmacist, who shared about psychiatric medications, side effects, treatment considerations and how to communicate with medical professionals. Group Members asked questions and shared medication concerns. Counselor prompted group members to reference a worksheet called, "Body Scan" to jot down questions and concerns about their physical health in preparation for their upcoming appointments with medical professionals. Client attending 30 day eval for dementia dx tomorrow, and is hopeful about outcomes. Counselor encouraged routine medical check-ups, preparing for appointments, following up with recommendations and seeking specialist if needed.  Second Therapeutic Activity: Counselor reviewed how to create a [place] with group members. Counselor walked them through each of the 6 steps, sharing examples for each section, such as  warning signs, coping skills, supports, professionals, safety considerations and reasons for living. Group members shared their responses, specifically what their environmental safety needs are, to open up ideas for others and to get feedback. Counselor promoted group members share their plans with their providers and their support system, to enhance and secure adherence to prevent a mental health crisis.   Check Out: Counselor prompted group members to identify one self-care practice or productivity activity they would like to engage in today. Client shared plans to meet with a lawyer to update her will. Client endorsed safety plan to be followed to prevent safety issues.   Assessment and Plan: Clinician recommends that Client remain in IOP treatment to better manage mental health symptoms, stabilization and to address treatment plan goals. Clinician recommends adherence to crisis/safety plan, taking medications as prescribed, and following up with medical professionals if any issues arise.    Follow Up Instructions: Clinician will send Webex link for next session. The Client was advised to call back or seek an in-person evaluation if the symptoms worsen or if the condition fails to improve as anticipated.     I provided 180 minutes of non-face-to-face time during this encounter.     Lise Auer, LCSW

## 2021-02-12 ENCOUNTER — Ambulatory Visit: Payer: BC Managed Care – PPO | Admitting: Neurology

## 2021-02-12 ENCOUNTER — Encounter: Payer: Self-pay | Admitting: Neurology

## 2021-02-12 ENCOUNTER — Other Ambulatory Visit: Payer: Self-pay

## 2021-02-12 VITALS — BP 119/73 | HR 66 | Ht 67.0 in | Wt 166.0 lb

## 2021-02-12 DIAGNOSIS — R413 Other amnesia: Secondary | ICD-10-CM

## 2021-02-12 MED ORDER — DONEPEZIL HCL 10 MG PO TABS: 10.0000 mg | ORAL_TABLET | Freq: Every morning | ORAL | 11 refills | Status: DC
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
## 2021-02-12 NOTE — Patient Instructions (Signed)
Looking great!  Schedule Neurocognitive testing  2. Continue Aricept (Donepezil) 10mg  daily for now  3. Continue all your other medications, continue follow-up with Behavioral Health  4. Follow-up in 6 months, call for any changes    RECOMMENDATIONS FOR ALL PATIENTS WITH MEMORY PROBLEMS: 1. Continue to exercise (Recommend 30 minutes of walking everyday, or 3 hours every week) 2. Increase social interactions - continue going to [place] and enjoy social gatherings with friends and family 3. Eat healthy, avoid fried foods and eat more fruits and vegetables 4. Maintain adequate blood pressure, blood sugar, and blood cholesterol level. Reducing the risk of stroke and cardiovascular disease also helps promoting better memory. 5. Avoid stressful situations. Live a simple life and avoid aggravations. Organize your time and prepare for the next day in anticipation. 6. Sleep well, avoid any interruptions of sleep and avoid any distractions in the bedroom that may interfere with adequate sleep quality 7. Avoid sugar, avoid sweets as there is a strong link between excessive sugar intake, diabetes, and cognitive impairment We discussed the Mediterranean diet, which has been shown to help patients reduce the risk of progressive memory disorders and reduces cardiovascular risk. This includes eating fish, eat fruits and green leafy vegetables, nuts like almonds and hazelnuts, walnuts, and also use olive oil. Avoid fast foods and fried foods as much as possible. Avoid sweets and sugar as sugar use has been linked to worsening of memory function.

## 2021-02-13 ENCOUNTER — Other Ambulatory Visit (HOSPITAL_COMMUNITY): Payer: Medicare Other | Attending: Psychiatry | Admitting: Psychiatry

## 2021-02-13 ENCOUNTER — Encounter (HOSPITAL_COMMUNITY): Payer: Self-pay | Admitting: Psychiatry

## 2021-02-13 DIAGNOSIS — F419 Anxiety disorder, unspecified: Secondary | ICD-10-CM | POA: Insufficient documentation

## 2021-02-13 DIAGNOSIS — Z79899 Other long term (current) drug therapy: Secondary | ICD-10-CM | POA: Insufficient documentation

## 2021-02-13 DIAGNOSIS — F32A Depression, unspecified: Secondary | ICD-10-CM | POA: Diagnosis not present

## 2021-02-13 NOTE — Progress Notes (Signed)
Virtual Visit via Video Note  I connected with Ellen [name] on 02/13/21 at  9:00 AM EDT by a video enabled telemedicine application and verified that I am speaking with the correct person using two identifiers.  At orientation to the IOP program, Case Manager discussed the limitations of evaluation and management by telemedicine and the availability of in person appointments. The patient expressed understanding and agreed to proceed with virtual visits throughout the duration of the program.   Location:  Patient: Patient Home Provider: Home Office   History of Present Illness: Anxiety and Depression  Observations/Objective: Check In: Case Manager checked in with all participants to review discharge dates, insurance authorizations, work-related documents and needs from the treatment team regarding medications. Client stated needs and engaged in discussion. Counselor introduced new Client to group with group welcoming them and starting the joining process.   Initial Therapeutic Activity: Counselor facilitated a check-in with group members to assess mood and current functioning. Client shared details of their mental health management since our last session, including challenges and successes. Counselor engaged group in discussion, covering the following topics: making a plan for long weekend, structuring days, coping with trauma triggers from fireworks and crowds, and including socialization for coping. Client presents with moderate depression and moderate anxiety. Client denied any current SI/HI/psychosis.  Second Therapeutic Activity: Counselor prompted group members to engage in a therapeutic activity to compare and contrast their anxiety and their depression. Counselor had them use a vin diagram to make visualization. Group members shared their reflections and responses, to share language used to describe symptoms. Counselor then shared a video about how avoidance is a common symptom of  depression and anxiety, sharing skills on how to get better at feeling, instead of just trying to feel better through avoidance and numbing emotions. Group engaged in takeaways from video and discussed strategies in decreasing avoidance.    Check Out: Counselor closed program by allowing time to celebrate a graduating group member. Counselor shared reflections on progress and allow space for group members to share well wishes and encouragements with the graduating client. Counselor prompted graduating client to share takeaways, reflect on progress and final thoughts for the group. Client endorsed safety plan to be followed to prevent safety issues.   Assessment and Plan: Clinician recommends that Client remain in IOP treatment to better manage mental health symptoms, stabilization and to address treatment plan goals. Clinician recommends adherence to crisis/safety plan, taking medications as prescribed, and following up with medical professionals if any issues arise.    Follow Up Instructions: Clinician will send Webex link for next session. The Client was advised to call back or seek an in-person evaluation if the symptoms worsen or if the condition fails to improve as anticipated.     I provided 180 minutes of non-face-to-face time during this encounter.     Lise Auer, LCSW

## 2021-02-17 ENCOUNTER — Other Ambulatory Visit (HOSPITAL_COMMUNITY): Payer: Medicare Other | Admitting: Psychiatry

## 2021-02-17 ENCOUNTER — Other Ambulatory Visit: Payer: Self-pay

## 2021-02-17 ENCOUNTER — Encounter (HOSPITAL_COMMUNITY): Payer: Self-pay

## 2021-02-17 DIAGNOSIS — F32A Depression, unspecified: Secondary | ICD-10-CM | POA: Diagnosis not present

## 2021-02-17 DIAGNOSIS — F03B18 Unspecified dementia, moderate, with other behavioral disturbance: Secondary | ICD-10-CM

## 2021-02-17 NOTE — Progress Notes (Signed)
Virtual Visit via Video Note  I connected with Ellen [name] on 02/17/21 at  9:00 AM EDT by a video enabled telemedicine application and verified that I am speaking with the correct person using two identifiers.  At orientation to the IOP program, Case Manager discussed the limitations of evaluation and management by telemedicine and the availability of in person appointments. The patient expressed understanding and agreed to proceed with virtual visits throughout the duration of the program.   Location:  Patient: Patient Home Provider: Home Office   History of Present Illness: Anxiety and Depression  Observations/Objective: Check In: Counselor checked in with all participants to review discharge dates, insurance authorizations, work-related documents and needs from the treatment team regarding medications. Client stated needs and engaged in discussion.   Initial Therapeutic Activity: Counselor facilitated a check-in with group members to assess mood and current functioning. Client shared details of their mental health management since our last session, including challenges and successes. Counselor engaged group in discussion, covering the following topics: holiday coping skills, self-care application, generational differences in concept of [place] and social norms, self-forgiveness, boundary setting, dealing with disappointments, stretching self and emotional support animals. Client presents with moderate depression and moderate anxiety. Client denied any current SI/HI/psychosis.  Second Therapeutic Activity: Counselor prompted group members to engage in a therapeutic activity in assessing life domains of psychological, emotional, spiritual, physical, personal and professional. After sharing how they scaled each domain. Group members identified which areas they need more coping skills and self-care strategies. Counselor provided group with 2 exhaustive lists of coping skills to add to their  strategies. Counselor challenged group members to make list accessible to use when needing to regulate emotions or develop in these areas.   Check Out: Counselor closed program by prompting group members to utilize self-care practices or productivity activities. Client endorsed safety plan to be followed to prevent safety issues.   Assessment and Plan: Clinician recommends that Client remain in IOP treatment to better manage mental health symptoms, stabilization and to address treatment plan goals. Clinician recommends adherence to crisis/safety plan, taking medications as prescribed, and following up with medical professionals if any issues arise.    Follow Up Instructions: Clinician will send Webex link for next session. The Client was advised to call back or seek an in-person evaluation if the symptoms worsen or if the condition fails to improve as anticipated.     I provided 180 minutes of non-face-to-face time during this encounter.     Lise Auer, LCSW

## 2021-02-18 ENCOUNTER — Other Ambulatory Visit: Payer: Self-pay

## 2021-02-18 ENCOUNTER — Other Ambulatory Visit (HOSPITAL_COMMUNITY): Payer: Medicare Other | Admitting: Psychiatry

## 2021-02-18 ENCOUNTER — Encounter (HOSPITAL_COMMUNITY): Payer: Self-pay

## 2021-02-18 DIAGNOSIS — F32A Depression, unspecified: Secondary | ICD-10-CM | POA: Diagnosis not present

## 2021-02-18 NOTE — Progress Notes (Signed)
Virtual Visit via Video Note  I connected with [name] on 02/18/21 at  9:00 AM EDT by a video enabled telemedicine application and verified that I am speaking with the correct person using two identifiers.  At orientation to the IOP program, Case Manager discussed the limitations of evaluation and management by telemedicine and the availability of in person appointments. The patient expressed understanding and agreed to proceed with virtual visits throughout the duration of the program.   Location:  Patient: Patient Home Provider: Home Office   History of Present Illness: Anxiety and Depression  Observations/Objective: Check In: Counselor checked in with all participants to review discharge dates, insurance authorizations, work-related documents and needs from the treatment team regarding medications. Client stated needs and engaged in discussion.   Initial Therapeutic Activity: Counselor introduced [employer], [place] Chaplain to present information and discussion on Grief and Loss. Group members engaged in discussion, sharing how grief impacts them, what comforts them, what emotions are felt, labeling losses, etc. After guest speaker logged off, Counselor prompted group to spend 10-15 minutes journaling to process personal grief and loss situations. Counselor processed entries with group and client's identified areas for additional processing in individual therapy.   Second Therapeutic Activity: Counselor facilitated a check-in with group members to assess mood and current functioning. Client shared details of their mental health management since our last session, including challenges and successes. Counselor engaged group in discussion, covering the following topics: social isolation vs loneliness, risks, long term effects and tips to combat mental and physical health decline. Client denied any current SI/HI/psychosis.  Check Out: Counselor closed program by prompting group  members to utilize self-care practices or productivity activities. Client plans to nap and take a walk. Client endorsed safety plan to be followed to prevent safety issues.   Assessment and Plan: Clinician recommends that Client remain in IOP treatment to better manage mental health symptoms, stabilization and to address treatment plan goals. Clinician recommends adherence to crisis/safety plan, taking medications as prescribed, and following up with medical professionals if any issues arise.    Follow Up Instructions: Clinician will send Webex link for next session. The Client was advised to call back or seek an in-person evaluation if the symptoms worsen or if the condition fails to improve as anticipated.     I provided 180 minutes of non-face-to-face time during this encounter.     Lise Auer, LCSW

## 2021-02-19 ENCOUNTER — Other Ambulatory Visit: Payer: Self-pay

## 2021-02-19 ENCOUNTER — Encounter (HOSPITAL_COMMUNITY): Payer: Self-pay

## 2021-02-19 ENCOUNTER — Other Ambulatory Visit (HOSPITAL_COMMUNITY): Payer: Medicare Other | Admitting: Psychiatry

## 2021-02-19 DIAGNOSIS — F419 Anxiety disorder, unspecified: Secondary | ICD-10-CM

## 2021-02-19 DIAGNOSIS — F32A Depression, unspecified: Secondary | ICD-10-CM | POA: Diagnosis not present

## 2021-02-19 NOTE — Progress Notes (Signed)
Virtual Visit via Video Note  I connected with [name] on 02/19/21 at  9:00 AM EDT by a video enabled telemedicine application and verified that I am speaking with the correct person using two identifiers.  At orientation to the IOP program, Case Manager discussed the limitations of evaluation and management by telemedicine and the availability of in person appointments. The patient expressed understanding and agreed to proceed with virtual visits throughout the duration of the program.   Location:  Patient: Patient Home Provider: Home Office   History of Present Illness: Anxiety and depression  Observations/Objective: Check In: Counselor checked in with all participants to review discharge dates, insurance authorizations, work-related documents and needs from the treatment team regarding medications. Client stated needs and engaged in discussion.   Initial Therapeutic Activity: Counselor facilitated a check-in with group members to assess mood and current functioning. Client shared details of their mental health management since our last session, including challenges and successes. Counselor engaged group in discussion, covering the following topics: managing disappointment, morning routines, sleep hygiene, rage rooms, recreational activities, social networks, grief and loss. Client denied any current SI/HI/psychosis.  Second Therapeutic Activity: Counselor prompted group members to make a list of feelings in a minutes time, then report out to the group. Counselor discussed importance of labeling emotions and emotional intelligence. Counselor provided a link for Group to assess Emotional Intelligence, then shared scores. Counselor review 5 components of Emotional Intelligence, with group members applying content to areas of growth identified in the assessment. Counselor shared a video on emotional intelligence, then shared a video about Intense Emotions in the Brain, which taught the  hand model of the brain. Group discussed content and application. Client engaged in discussion and assessment, with understanding and desire to apply skills.   Check Out: Counselor closed program by allowing time to celebrate a graduating group member. Counselor shared reflections on progress and allow space for group members to share well wishes and encouragements with the graduating client. Counselor prompted graduating client to share takeaways, reflect on progress and final thoughts for the group. Client endorsed safety plan to be followed to prevent safety issues.   Assessment and Plan: Clinician recommends that Client remain in IOP treatment to better manage mental health symptoms, stabilization and to address treatment plan goals. Clinician recommends adherence to crisis/safety plan, taking medications as prescribed, and following up with medical professionals if any issues arise.    Follow Up Instructions: Clinician will send Webex link for next session. The Client was advised to call back or seek an in-person evaluation if the symptoms worsen or if the condition fails to improve as anticipated.     I provided 180 minutes of non-face-to-face time during this encounter.     Lise Auer, LCSW

## 2021-02-20 ENCOUNTER — Encounter (HOSPITAL_COMMUNITY): Payer: Self-pay

## 2021-02-20 ENCOUNTER — Other Ambulatory Visit (HOSPITAL_COMMUNITY): Payer: Medicare Other | Admitting: Psychiatry

## 2021-02-20 DIAGNOSIS — F32A Depression, unspecified: Secondary | ICD-10-CM

## 2021-02-20 DIAGNOSIS — F419 Anxiety disorder, unspecified: Secondary | ICD-10-CM

## 2021-02-20 NOTE — Progress Notes (Signed)
Virtual Visit via Video Note  I connected with [name] on 02/20/21 at  9:00 AM EDT by a video enabled telemedicine application and verified that I am speaking with the correct person using two identifiers.  At orientation to the IOP program, Case Manager discussed the limitations of evaluation and management by telemedicine and the availability of in person appointments. The patient expressed understanding and agreed to proceed with virtual visits throughout the duration of the program.   Location:  Patient: Patient Home Provider: Home Office   History of Present Illness: Anxiety and Depression  Observations/Objective: Check In: Counselor checked in with all participants to review discharge dates, insurance authorizations, work-related documents and needs from the treatment team regarding medications. Client stated needs and engaged in discussion.   Initial Therapeutic Activity: Counselor facilitated a check-in with group members to assess mood and current functioning. Client shared details of their mental health management since our last session, including challenges and successes. Counselor engaged group in discussion, covering the following topics: finding enjoyment in profession, medication management and needs, discharge planning, local resources, and safe/comfort environments. Client denied any current SI/HI/psychosis.  Second Therapeutic Activity: Counselor prepared Group Members for Guided Imagery Activity. The script was named, Relaxation for Generalized Anxiety. It included deep breathing, visualizations, progressive muscle relaxation, mindfulness and meditation. Counselor read through script with Client engaging fully in activity. Group members shared feedback about benefits and application of skills in daily life. Client noted that deep breathing and relaxation was most enjoyable and transferable for them.   Check Out:  Counselor prompted group members to identify one  self-care practice or productivity activity they would like to engage in today. Client plans to spend time with family and declutter home over the weekend. Client endorsed safety plan to be followed to prevent safety issues.  Assessment and Plan: Clinician recommends that Client remain in IOP treatment to better manage mental health symptoms, stabilization and to address treatment plan goals. Clinician recommends adherence to crisis/safety plan, taking medications as prescribed, and following up with medical professionals if any issues arise.    Follow Up Instructions: Clinician will send Webex link for next session. The Client was advised to call back or seek an in-person evaluation if the symptoms worsen or if the condition fails to improve as anticipated.     I provided 180 minutes of non-face-to-face time during this encounter.     Lise Auer, LCSW

## 2021-02-23 ENCOUNTER — Other Ambulatory Visit: Payer: Self-pay

## 2021-02-23 ENCOUNTER — Other Ambulatory Visit (HOSPITAL_COMMUNITY): Payer: Medicare Other | Admitting: Psychiatry

## 2021-02-23 ENCOUNTER — Encounter (HOSPITAL_COMMUNITY): Payer: Self-pay | Admitting: Psychiatry

## 2021-02-23 DIAGNOSIS — F32A Depression, unspecified: Secondary | ICD-10-CM

## 2021-02-23 DIAGNOSIS — F419 Anxiety disorder, unspecified: Secondary | ICD-10-CM

## 2021-02-23 NOTE — Progress Notes (Signed)
Virtual Visit via Video Note  I connected with Ellen [name] on 02/23/21 at  9:00 AM EDT by a video enabled telemedicine application and verified that I am speaking with the correct person using two identifiers.  At orientation to the IOP program, Case Manager discussed the limitations of evaluation and management by telemedicine and the availability of in person appointments. The patient expressed understanding and agreed to proceed with virtual visits throughout the duration of the program.   Location:  Patient: Patient Home Provider: Home Office   History of Present Illness: Anxiety and Depression  Observations/Objective: Check In: Case Manager checked in with all participants to review discharge dates, insurance authorizations, work-related documents and needs from the treatment team regarding medications. Client stated needs and engaged in discussion.   Initial Therapeutic Activity: Counselor facilitated a check-in with group members to assess mood and current functioning. Client shared details of their mental health management since our last session, including challenges and successes. Counselor engaged group in discussion, covering the following topics: combatting shutting down and isolation, processing emotions, benefits of decluttering, family dynamics, and self-care. Client denied any current SI/HI/psychosis.  Second Therapeutic Activity: Counselor presented psychoeducation on Communication Skills. Counselor outlined the differences between Assertive, Passive and Aggressive communication, defining and role playing scenarios. Counselor played a Dentist, engaging discussion and application factors with group members. Client participated fully in processing information, asking questions and applying to self. Client able to identify areas of personal growth within context of relationships and environments.   Check Out:  Counselor prompted group  members to identify one self-care practice or productivity activity they would like to engage in today. Client endorsed safety plan to be followed to prevent safety issues.  Assessment and Plan: Clinician recommends that Client remain in IOP treatment to better manage mental health symptoms, stabilization and to address treatment plan goals. Clinician recommends adherence to crisis/safety plan, taking medications as prescribed, and following up with medical professionals if any issues arise.    Follow Up Instructions: Clinician will send Webex link for next session. The Client was advised to call back or seek an in-person evaluation if the symptoms worsen or if the condition fails to improve as anticipated.     I provided 180 minutes of non-face-to-face time during this encounter.     Lise Auer, LCSW

## 2021-02-24 ENCOUNTER — Other Ambulatory Visit (HOSPITAL_COMMUNITY): Payer: Medicare Other | Admitting: Psychiatry

## 2021-02-24 ENCOUNTER — Encounter (HOSPITAL_COMMUNITY): Payer: Self-pay | Admitting: Family

## 2021-02-24 DIAGNOSIS — F32A Depression, unspecified: Secondary | ICD-10-CM | POA: Diagnosis not present

## 2021-02-24 DIAGNOSIS — F419 Anxiety disorder, unspecified: Secondary | ICD-10-CM

## 2021-02-24 NOTE — Progress Notes (Signed)
Virtual Visit via Video Note  I connected with Ellen [name] on 02/24/21 at  9:00 AM EDT by a video enabled telemedicine application and verified that I am speaking with the correct person using two identifiers.  Location: Patient: Home Provider: Office   I discussed the limitations of evaluation and management by telemedicine and the availability of in person appointments. The patient expressed understanding and agreed to proceed.    I discussed the assessment and treatment plan with the patient. The patient was provided an opportunity to ask questions and all were answered. The patient agreed with the plan and demonstrated an understanding of the instructions.   The patient was advised to call back or seek an in-person evaluation if the symptoms worsen or if the condition fails to improve as anticipated.  I provided 15 minutes of non-face-to-face time during this encounter.   Derrill Center, NP   [facility] Intensive Outpatient Program Discharge Summary  Ellen [name] [id number]  Admission date: 01/27/2021 Discharge date: 02/24/2021  Reason for admission: Garry took an intentionally overdosed on her trazodone prescription last night. Said she took "several hand fulls". Following the overdose she started to feel "very sick". Immediately, informed her wife and granddaughter what she had done.  Family called EMS. She was seen and evaluated by this NP on admission to the emergency department. Patient was initiated on Celexa 10 mg daily for mood stabilization. She continues to endorse depression symptoms, however is denying suicidal or homicidal ideations. As was stated she has a suicide attempt due to new diagnosis of dementia.  States her wife and her brother asked her to be evaluated.  Kyrstin reported symptom of worries to the passing of her father who died from dementia diagnosis. Denied previous inpatient or outpatient admission. Reported she was followed  by her primary care provider for medication management. Patient to start IOP on 01/27/2021  Chemical Use History: Reported history with substance abuse to alcohol.  Currently followed by AA support groups  Family of Origin Issues: States her wife has been supportive during her attendance and intensive outpatient programming.  Progress in Program Toward Treatment Goals: Earlee Herald has attended and participated with daily group session with active and engaged participation.  Currently denying suicidal or homicidal ideations.  Denies auditory or visual hallucinations.  Reports group setting has been helpful and has enjoyed the support networking since attending group session.  We will make medications refills available.  Patient to follow-up with  primary care provider for additional refills.   Progress (rationale): Keep follow-up with PCP Georgina Snell.  Patient is currently seeking therapy services.  Case management to provide additional outpatient resources for additional support such as [place].   Take all medications as prescribed. Keep all follow-up appointments as scheduled.  Do not consume alcohol or use illegal drugs while on prescription medications. Report any adverse effects from your medications to your primary care provider promptly.  In the event of recurrent symptoms or worsening symptoms, call 911, a crisis hotline, or go to the nearest emergency department for evaluation.     Derrill Center, NP 02/24/2021

## 2021-02-24 NOTE — Progress Notes (Signed)
Virtual Visit via Video Note  I connected with [name] on 02/24/21 at  9:00 AM EDT by a video enabled telemedicine application and verified that I am speaking with the correct person using two identifiers.  At orientation to the IOP program, Case Manager discussed the limitations of evaluation and management by telemedicine and the availability of in person appointments. The patient expressed understanding and agreed to proceed with virtual visits throughout the duration of the program.   Location:  Patient: Patient Home Provider: Home Office   History of Present Illness: Anxiety and Depression  Observations/Objective: Check In: Case Manager checked in with all participants to review discharge dates, insurance authorizations, work-related documents and needs from the treatment team regarding medications. Client stated needs and engaged in discussion. Case Manager introduced new Client, with Group welcoming and starting the joining process.   Initial Therapeutic Activity: Counselor facilitated a check-in with group members to assess mood and current functioning. Client shared details of their mental health management since our last session, including challenges and successes. Counselor engaged group in discussion, covering the following topics: workplace coping skills, connecting with individual providers and medical providers, recovering from emotional outbursts, benefits of exercise and word games for memory health. Client denied any current SI/HI/psychosis.  Second Therapeutic Activity: Counselor presented psychoeducation on Adult nurse and the CBT Model. Counselor provided group with a video highlighting skill of Notice-Challenge-Change and application considerations to improve rational thinking. Counselor processed takeaways with the group. Client identified ways in which they struggle with irrational/emotional thinking and personalization. Counselor provided  additional worksheets, information and resources for group to complete for homework and report back later in the week.   Check Out:  Counselor closed program by allowing time to celebrate a graduating group member. Counselor shared reflections on progress and allow space for group members to share well wishes and encouragements with the graduating client. Counselor prompted graduating client to share takeaways, reflect on progress and final thoughts for the group. Client endorsed safety plan to be followed to prevent safety issues.  Assessment and Plan: Clinician recommends that Client step down from IOP to individual therapy, medication management, support groups, AA and welness programs. Clinician recommends adherence to crisis/safety plan, taking medications as prescribed, and following up with medical professionals if any issues arise.    Follow Up Instructions: Clinician will send Webex link for next session. The Client was advised to call back or seek an in-person evaluation if the symptoms worsen or if the condition fails to improve as anticipated.     I provided 180 minutes of non-face-to-face time during this encounter.     Lise Auer, LCSW

## 2021-02-24 NOTE — Patient Instructions (Signed)
D:  Patient completed MH-IOP today.  A:  Discharge today.  Follow up with Dr. Tommi Rumps (PCP) and [facility] or [facility] or [facility] for individual therapy.  Strongly encourage support groups and AA mtgs.  R:  Patient receptive.

## 2021-02-24 NOTE — Progress Notes (Signed)
Virtual Visit via Video Note  I connected with Ellen [name] on @TODAY @ at  9:00 AM EDT by a video enabled telemedicine application and verified that I am speaking with the correct person using two identifiers.  Location: Patient: at home Provider: at office   I discussed the limitations of evaluation and management by telemedicine and the availability of in person appointments. The patient expressed understanding and agreed to proceed.  I discussed the assessment and treatment plan with the patient. The patient was provided an opportunity to ask questions and all were answered. The patient agreed with the plan and demonstrated an understanding of the instructions.   The patient was advised to call back or seek an in-person evaluation if the symptoms worsen or if the condition fails to improve as anticipated.  I provided 20 minutes of non-face-to-face time during this encounter.   Ellen [name], M.Ed,CNA   Patient ID: Ellen [name], female   DOB: 1956/05/12, 65 y.o.   MRN: [id number] As per previous CCA states:  Pt is a 65 year old female who presents to [facility] ED accompanied by her significant other, Ellen [name] [phone], who participated in assessment at Pt's request. Pt says she has felt very depressed recently, stating "There are a lot of things going on in my world." Pt reports she has a history of depression and has been severely depressed for the past two weeks. Pt acknowledges that she ingested two handfuls of Trazodone yesterday in a suicide attempt. Pt attempted to take more pills today but experienced emesis. She denies any history of previous suicide attempts. Pt describes her mood recently as "unhappy." Pt acknowledges symptoms including crying spells, social withdrawal, loss of interest in usual pleasures, fatigue, irritability, decreased concentration, decreased sleep, decreased appetite and feelings of worthlessness and hopelessness. Pt denies any history of  intentional self-injurious behaviors. Pt denies current homicidal ideation or history of violence. Pt denies current auditory or visual hallucinations. Pt states she experienced hallucinations a few months ago of believing that people were running through the house. Pt reports she has a history of alcohol use but since going to a 30-day treatment center in 2019 has not drank alcohol. She denies any other substance use.   Pt has difficulty expressing her stressors. [name] has been diagnosed with dementia. She says in March Pt's neurologist prescribed Lexapro and Aricept and that Pt's symptoms improved. She says Pt was also prescribed Trazodone but that medication made Pt lethargic and sleep excessively. [name] says two weeks ago Pt's granddaughter came to live with them. She says this disrupted Pt's routine and that is when she noticed Pt's mood was more depressed.   Pt says she has three adult children, one she has a good relationship and two that are estranged. Pt has no history of abuse or trauma. Pt denies legal problems. She denies access to firearms. [name] says Pt will see her neurologist but has been reluctant to see a psychiatrist. Pt went to a 30-day substance abuse treatment program in [place] in 2019 but otherwise has not had any inpatient mental health treatment.     Patient started MH-IOP on 01-27-21.  Reports ongoing stressor of being diagnosed with dementia recently.  States she is glad to be in a group with other people.  Denies any SI/HI or A/V hallucinations.  Cc: previous CCA/chart for more hx.    Pt completed MH-IOP today.  Reports although she is anxious about being discharged it is  easier for her to cope with things now.  "The meds, therapy and AA mtgs have helped me a lot."  On a scale of 1-10 (10 being the worst), pt rates her anxiety and depression at a 4.  Denies SI/HI or A/V hallucinations.  Reports that the groups were very helpful.  A:  D/C pt today.  Provided  pt with [facility], [facility], and [facility]  phone numbers and PCP (Dr. Tommi Rumps).  Strongly encourage pt to continue with AA mtgs and groups thru [facility].  R:  Patient receptive.  Ellen [name], M.Ed,CNA

## 2021-02-25 ENCOUNTER — Encounter: Payer: Self-pay | Admitting: Adult Health

## 2021-02-25 ENCOUNTER — Other Ambulatory Visit: Payer: Self-pay | Admitting: Adult Health

## 2021-02-25 ENCOUNTER — Ambulatory Visit (HOSPITAL_COMMUNITY): Payer: Medicare Other

## 2021-02-25 DIAGNOSIS — F32A Depression, unspecified: Secondary | ICD-10-CM

## 2021-02-25 DIAGNOSIS — F419 Anxiety disorder, unspecified: Secondary | ICD-10-CM

## 2021-02-25 MED ORDER — CITALOPRAM HYDROBROMIDE 10 MG PO TABS: 10.0000 mg | ORAL_TABLET | Freq: Every day | ORAL | 2 refills | Status: DC

## 2021-02-25 NOTE — Telephone Encounter (Signed)
Please advise pt wanting a referral to see a therapist one on one section. Pt is now in a group therapy session and stated the therapist prescribed Citalopram 10 1 tab a day. Pt wants to know if she can have refills through you since group therapy is about to be over so she will not be able to get the medication.

## 2021-02-26 ENCOUNTER — Ambulatory Visit (HOSPITAL_COMMUNITY): Payer: Medicare Other

## 2021-02-27 ENCOUNTER — Ambulatory Visit (HOSPITAL_COMMUNITY): Payer: Medicare Other

## 2021-03-02 ENCOUNTER — Ambulatory Visit (HOSPITAL_COMMUNITY): Payer: Medicare Other

## 2021-03-18 ENCOUNTER — Encounter: Payer: Self-pay | Admitting: Adult Health

## 2021-03-24 ENCOUNTER — Other Ambulatory Visit: Payer: Self-pay

## 2021-03-24 ENCOUNTER — Ambulatory Visit (HOSPITAL_COMMUNITY): Payer: Medicare Other | Admitting: Psychiatry

## 2021-03-24 DIAGNOSIS — F32A Depression, unspecified: Secondary | ICD-10-CM

## 2021-03-24 DIAGNOSIS — F419 Anxiety disorder, unspecified: Secondary | ICD-10-CM

## 2021-03-25 ENCOUNTER — Other Ambulatory Visit: Payer: Self-pay | Admitting: Adult Health

## 2021-03-25 ENCOUNTER — Encounter (HOSPITAL_COMMUNITY): Payer: Self-pay | Admitting: Psychiatry

## 2021-03-25 DIAGNOSIS — G4700 Insomnia, unspecified: Secondary | ICD-10-CM

## 2021-03-25 NOTE — Progress Notes (Signed)
Group member contacted stating issues with being able to join group at set time. No current needs noted.   Ellen [name] [place] LCSW

## 2021-03-30 ENCOUNTER — Ambulatory Visit: Payer: Medicare Other | Admitting: Psychology

## 2021-04-01 ENCOUNTER — Ambulatory Visit (INDEPENDENT_AMBULATORY_CARE_PROVIDER_SITE_OTHER): Payer: Medicare Other | Admitting: Psychology

## 2021-04-01 DIAGNOSIS — F3289 Other specified depressive episodes: Secondary | ICD-10-CM | POA: Diagnosis not present

## 2021-04-06 ENCOUNTER — Ambulatory Visit (INDEPENDENT_AMBULATORY_CARE_PROVIDER_SITE_OTHER): Payer: Medicare Other | Admitting: Psychology

## 2021-04-06 DIAGNOSIS — F3289 Other specified depressive episodes: Secondary | ICD-10-CM | POA: Diagnosis not present

## 2021-04-07 ENCOUNTER — Other Ambulatory Visit: Payer: Self-pay

## 2021-04-07 ENCOUNTER — Ambulatory Visit: Payer: Medicare Other | Admitting: Adult Health

## 2021-04-07 ENCOUNTER — Ambulatory Visit (INDEPENDENT_AMBULATORY_CARE_PROVIDER_SITE_OTHER): Payer: Medicare Other | Admitting: Psychiatry

## 2021-04-07 DIAGNOSIS — F419 Anxiety disorder, unspecified: Secondary | ICD-10-CM | POA: Diagnosis not present

## 2021-04-07 DIAGNOSIS — F32A Depression, unspecified: Secondary | ICD-10-CM

## 2021-04-08 ENCOUNTER — Encounter (HOSPITAL_COMMUNITY): Payer: Self-pay | Admitting: Psychiatry

## 2021-04-08 NOTE — Progress Notes (Signed)
Virtual Visit via Video Note  I connected with Ellen [name] on 04/07/21 at 12:00 PM EDT by a video enabled telemedicine application and verified that I am speaking with the correct person using two identifiers.  I discussed the limitations of evaluation and management by telemedicine and the availability of in person appointments. The patient expressed understanding and agreed to proceed.    Location: Patient: Patient Home Provider: Home Office   GROUP GOAL: Client will attend IOP Aftercare Group Therapy 2-4x a month to connect with peers and to apply strategies discussed to improve mental health condition.  History of Present Illness: Anxiety with depression  Observations/Objective: Counselor met with Patient in the context of Group Therapy, via Webex. Counselor prompted Patient to share updates on coping skill application and individual therapeutic process in management of mental health. Client reports intentional efforts to maintain mental health.  Counselor prompted Patient to share areas of concern, challenges and identify barriers to meeting current goals. Group topics covered: local resources, anger, grief and loss, progress in functioning, utilizing natural supports, communication with providers, medication management and concerns, and improving PTSD symptoms.  Patient engaged in discussion, provided feedback for others within the group and took note of additional strategies reviewed and discussed.   Assessment and Plan: Counselor recommends client continue following treatment plan goals, following crisis plan and following up with behavioral health and medical providers as needed. Patient is welcome to join group at next session.   Follow Up Instructions: Counselor to provide link for next session via Webex platform. The patient was advised to call back or seek an in-person evaluation if the symptoms worsen or if the condition fails to improve as anticipated.  I provided 90  minutes of non-face-to-face time during this encounter.   Lise Auer, LCSW

## 2021-04-08 NOTE — Addendum Note (Signed)
Addended by: Lise Auer on: 04/08/2021 01:24 PM   Modules accepted: Level of Service

## 2021-04-13 ENCOUNTER — Ambulatory Visit (INDEPENDENT_AMBULATORY_CARE_PROVIDER_SITE_OTHER): Payer: Medicare Other | Admitting: Psychology

## 2021-04-13 DIAGNOSIS — F3289 Other specified depressive episodes: Secondary | ICD-10-CM

## 2021-04-22 ENCOUNTER — Ambulatory Visit (INDEPENDENT_AMBULATORY_CARE_PROVIDER_SITE_OTHER): Payer: Medicare Other | Admitting: Psychology

## 2021-04-22 DIAGNOSIS — F3289 Other specified depressive episodes: Secondary | ICD-10-CM

## 2021-04-27 ENCOUNTER — Telehealth: Payer: Self-pay | Admitting: Adult Health

## 2021-04-27 ENCOUNTER — Ambulatory Visit (INDEPENDENT_AMBULATORY_CARE_PROVIDER_SITE_OTHER): Payer: Medicare Other | Admitting: Psychology

## 2021-04-27 DIAGNOSIS — F3289 Other specified depressive episodes: Secondary | ICD-10-CM | POA: Diagnosis not present

## 2021-04-27 NOTE — Telephone Encounter (Signed)
Pt needs to r/s appt for 04/30/21--provider out of office that morning.  You can r/s using a virtual time slot.

## 2021-04-28 ENCOUNTER — Other Ambulatory Visit: Payer: Self-pay

## 2021-04-28 ENCOUNTER — Ambulatory Visit (HOSPITAL_COMMUNITY): Payer: Medicare Other | Admitting: Psychiatry

## 2021-04-28 DIAGNOSIS — F419 Anxiety disorder, unspecified: Secondary | ICD-10-CM

## 2021-04-28 DIAGNOSIS — F32A Depression, unspecified: Secondary | ICD-10-CM

## 2021-04-30 ENCOUNTER — Encounter (HOSPITAL_COMMUNITY): Payer: Self-pay | Admitting: Psychiatry

## 2021-04-30 ENCOUNTER — Ambulatory Visit: Payer: Medicare Other | Admitting: Adult Health

## 2021-04-30 NOTE — Progress Notes (Signed)
Client unable to attend due to unforeseen scheduling conflicts.   Lise Auer, LCSW

## 2021-05-05 ENCOUNTER — Other Ambulatory Visit: Payer: Self-pay

## 2021-05-05 ENCOUNTER — Ambulatory Visit (INDEPENDENT_AMBULATORY_CARE_PROVIDER_SITE_OTHER): Payer: Medicare Other | Admitting: Adult Health

## 2021-05-05 ENCOUNTER — Encounter: Payer: Self-pay | Admitting: Adult Health

## 2021-05-05 VITALS — BP 120/82 | HR 60 | Temp 98.5°F | Ht 67.0 in | Wt 180.0 lb

## 2021-05-05 DIAGNOSIS — H6981 Other specified disorders of Eustachian tube, right ear: Secondary | ICD-10-CM | POA: Diagnosis not present

## 2021-05-05 DIAGNOSIS — M25561 Pain in right knee: Secondary | ICD-10-CM

## 2021-05-05 MED ORDER — METHYLPREDNISOLONE ACETATE 80 MG/ML IJ SUSP
80.0000 mg | Freq: Once | INTRAMUSCULAR | Status: AC
Start: 2021-05-05 — End: 2021-05-05
  Administered 2021-05-05: 80 mg via INTRA_ARTICULAR

## 2021-05-05 MED ORDER — FLUTICASONE PROPIONATE 50 MCG/ACT NA SUSP: 2.0000 | Freq: Every day | NASAL | 6 refills | Status: DC

## 2021-05-05 NOTE — Progress Notes (Signed)
Subjective:    Patient ID: Ellen [name], female    DOB: 04-Jan-1956, 65 y.o.   MRN: [id number]  HPI 65 year old female who  has a past medical history of Alcohol abuse, Depression, Hyperlipidemia, Hypertension, and Seasonal allergies.  She presents to the office today for worsening right sided chronic knee pain. She has been trying to stay more active and has noticed worsening right sided knee pain. Pain feels as an " ache" and feels like it is coming from within the knee. She denies trauma. Has not noticed any redness or warmth. Has some mild soft tissue swelling noted on the right lower aspect of her knee.  Comfort is worse with ambulation and side to side movements.  No decreased range of motion  Additionally, she also feels as though her right ear is plugged up.  Denies drainage from the ear, fevers, chills, or headaches     Review of Systems See HPI   Past Medical History:  Diagnosis Date   Alcohol abuse    Depression    Hyperlipidemia    Hypertension    Seasonal allergies     Social History   Socioeconomic History   Marital status: Soil scientist    Spouse name: Not on file   Number of children: Not on file   Years of education: 16   Highest education level: Not on file  Occupational History   Not on file  Tobacco Use   Smoking status: Former   Smokeless tobacco: Never  Vaping Use   Vaping Use: Never used  Substance and Sexual Activity   Alcohol use: Not Currently   Drug use: Never   Sexual activity: Not on file  Other Topics Concern   Not on file  Social History Narrative   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    Married to partner    Right handed   Drinks caffeine   Two story home      Social Determinants of Health   Financial Resource Strain: Not on file  Food Insecurity: Not on file  Transportation Needs: Not on file  Physical Activity: Not on file  Stress: Not on file  Social Connections: Not on file  Intimate Partner  Violence: Not on file    Past Surgical History:  Procedure Laterality Date   ABDOMINAL HYSTERECTOMY  1998    Family History  Problem Relation Age of Onset   High Cholesterol Mother    High blood pressure Mother    Diabetes Mother    High blood pressure Father    High Cholesterol Father    Diabetes Father    High blood pressure Brother    High Cholesterol Brother     No Known Allergies  Current Outpatient Medications on File Prior to Visit  Medication Sig Dispense Refill   benztropine (COGENTIN) 1 MG tablet TAKE 1 TABLET BY MOUTH EVERY DAY (Patient taking differently: Take 1 mg by mouth every morning.) 90 tablet 1   citalopram (CELEXA) 10 MG tablet Take 1 tablet (10 mg total) by mouth daily. 30 tablet 2   donepezil (ARICEPT) 10 MG tablet Take 1 tablet (10 mg total) by mouth every morning. 30 tablet 11   ezetimibe (ZETIA) 10 MG tablet Take 1 tablet (10 mg total) by mouth daily. (Patient taking differently: Take 10 mg by mouth every morning.) 90 tablet 0   hydrochlorothiazide (HYDRODIURIL) 25 MG tablet Take 1 tablet (25 mg total) by mouth daily. (Patient taking  differently: Take 25 mg by mouth every morning.) 90 tablet 0   Magnesium 250 MG TABS Take 250 mg by mouth daily as needed (cramps).     Multiple Vitamin (MULTIVITAMIN WITH MINERALS) TABS tablet Take 1 tablet by mouth every morning. ALIVE     naproxen sodium (ALEVE) 220 MG tablet Take 220-440 mg by mouth 2 (two) times daily as needed (pain).     rosuvastatin (CRESTOR) 40 MG tablet Take 1 tablet (40 mg total) by mouth daily. (Patient taking differently: Take 40 mg by mouth every morning.) 90 tablet 0   quinapril (ACCUPRIL) 20 MG tablet Take 1 tablet (20 mg total) by mouth daily. (Patient taking differently: Take 20 mg by mouth every morning.) 90 tablet 0   [DISCONTINUED] escitalopram (LEXAPRO) 10 MG tablet TAKE 1 TABLET BY MOUTH EVERY DAY 90 tablet 0   No current facility-administered medications on file prior to visit.    BP  120/82   Pulse 60   Temp 98.5 F (36.9 C) (Oral)   Ht 5\' 7"  (1.702 m)   Wt 180 lb (81.6 kg)   SpO2 92%   BMI 28.19 kg/m       Objective:   Physical Exam Vitals and nursing note reviewed.  Constitutional:      Appearance: Normal appearance.  HENT:     Right Ear: A middle ear effusion is present. Tympanic membrane is not erythematous or bulging.  Musculoskeletal:        General: Swelling present. No tenderness or deformity. Normal range of motion.     Right knee: No bony tenderness or crepitus. Normal range of motion. No tenderness. Normal meniscus and normal patellar mobility.     Instability Tests: Anterior drawer test negative. Posterior drawer test negative.     Comments: No loss of ROM of right knee   Skin:    General: Skin is warm and dry.     Capillary Refill: Capillary refill takes less than 2 seconds.  Neurological:     General: No focal deficit present.     Mental Status: She is alert and oriented to person, place, and time.  Psychiatric:        Mood and Affect: Mood normal.        Behavior: Behavior normal.        Thought Content: Thought content normal.        Judgment: Judgment normal.       Assessment & Plan:  1. Right knee pain, unspecified chronicity Discussed risks and benefits of corticosteroid injection and patient consented.  After prepping skin with betadine, injected 80 mg depomedrol and 2 cc of plain xylocaine with 22 gauge one and one half inch needle using anterolateral approach and pt tolerated well.  - methylPREDNISolone acetate (DEPO-MEDROL) injection 80 mg  2. Eustachian tube dysfunction, right  - fluticasone (FLONASE) 50 MCG/ACT nasal spray; Place 2 sprays into both nostrils daily.  Dispense: 16 g; Refill: 6   Dorothyann Peng, NP

## 2021-05-06 ENCOUNTER — Ambulatory Visit (INDEPENDENT_AMBULATORY_CARE_PROVIDER_SITE_OTHER): Payer: Medicare Other | Admitting: Psychology

## 2021-05-06 DIAGNOSIS — F3289 Other specified depressive episodes: Secondary | ICD-10-CM | POA: Diagnosis not present

## 2021-05-11 ENCOUNTER — Other Ambulatory Visit: Payer: Self-pay | Admitting: Adult Health

## 2021-05-11 ENCOUNTER — Ambulatory Visit (INDEPENDENT_AMBULATORY_CARE_PROVIDER_SITE_OTHER): Payer: Medicare Other | Admitting: Psychology

## 2021-05-11 DIAGNOSIS — F419 Anxiety disorder, unspecified: Secondary | ICD-10-CM

## 2021-05-11 DIAGNOSIS — F3289 Other specified depressive episodes: Secondary | ICD-10-CM | POA: Diagnosis not present

## 2021-05-12 ENCOUNTER — Other Ambulatory Visit: Payer: Self-pay | Admitting: Adult Health

## 2021-05-12 DIAGNOSIS — R251 Tremor, unspecified: Secondary | ICD-10-CM

## 2021-05-13 ENCOUNTER — Other Ambulatory Visit: Payer: Self-pay

## 2021-05-14 ENCOUNTER — Encounter: Payer: Self-pay | Admitting: Adult Health

## 2021-05-14 ENCOUNTER — Ambulatory Visit (INDEPENDENT_AMBULATORY_CARE_PROVIDER_SITE_OTHER): Payer: Medicare Other | Admitting: Adult Health

## 2021-05-14 VITALS — BP 128/80 | HR 86 | Temp 97.9°F | Ht 67.0 in | Wt 180.0 lb

## 2021-05-14 DIAGNOSIS — D492 Neoplasm of unspecified behavior of bone, soft tissue, and skin: Secondary | ICD-10-CM

## 2021-05-14 DIAGNOSIS — C44329 Squamous cell carcinoma of skin of other parts of face: Secondary | ICD-10-CM | POA: Diagnosis not present

## 2021-05-14 NOTE — Progress Notes (Signed)
Subjective:    Patient ID: Ellen [name], female    DOB: [birth year], 65 y.o.   MRN: [id number]  HPI 65 year old female who  has a past medical history of Alcohol abuse, Depression, Hyperlipidemia, Hypertension, and Seasonal allergies.  She presents to the office today for concern of neoplasm on her right temple area. Neoplasm has been present for couple months.  Does report bleeding when she picks at it.  Not healing the way that she hoped.  No prior history of skin cancer.    Review of Systems See HPI   Past Medical History:  Diagnosis Date   Alcohol abuse    Depression    Hyperlipidemia    Hypertension    Seasonal allergies     Social History   Socioeconomic History   Marital status: Soil scientist    Spouse name: Not on file   Number of children: Not on file   Years of education: 16   Highest education level: Not on file  Occupational History   Not on file  Tobacco Use   Smoking status: Former   Smokeless tobacco: Never  Vaping Use   Vaping Use: Never used  Substance and Sexual Activity   Alcohol use: Not Currently   Drug use: Never   Sexual activity: Not on file  Other Topics Concern   Not on file  Social History Narrative   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    Married to partner    Right handed   Drinks caffeine   Two story home      Social Determinants of Health   Financial Resource Strain: Not on file  Food Insecurity: Not on file  Transportation Needs: Not on file  Physical Activity: Not on file  Stress: Not on file  Social Connections: Not on file  Intimate Partner Violence: Not on file    Past Surgical History:  Procedure Laterality Date   ABDOMINAL HYSTERECTOMY  1998    Family History  Problem Relation Age of Onset   High Cholesterol Mother    High blood pressure Mother    Diabetes Mother    High blood pressure Father    High Cholesterol Father    Diabetes Father    High blood pressure Brother    High  Cholesterol Brother     No Known Allergies  Current Outpatient Medications on File Prior to Visit  Medication Sig Dispense Refill   benztropine (COGENTIN) 1 MG tablet TAKE 1 TABLET BY MOUTH EVERY DAY 90 tablet 1   citalopram (CELEXA) 10 MG tablet TAKE 1 TABLET BY MOUTH EVERY DAY 90 tablet 1   donepezil (ARICEPT) 10 MG tablet Take 1 tablet (10 mg total) by mouth every morning. 30 tablet 11   ezetimibe (ZETIA) 10 MG tablet TAKE 1 TABLET BY MOUTH EVERY DAY 90 tablet 0   fluticasone (FLONASE) 50 MCG/ACT nasal spray Place 2 sprays into both nostrils daily. 16 g 6   hydrochlorothiazide (HYDRODIURIL) 25 MG tablet TAKE 1 TABLET (25 MG TOTAL) BY MOUTH DAILY. 90 tablet 0   Magnesium 250 MG TABS Take 250 mg by mouth daily as needed (cramps).     Multiple Vitamin (MULTIVITAMIN WITH MINERALS) TABS tablet Take 1 tablet by mouth every morning. ALIVE     naproxen sodium (ALEVE) 220 MG tablet Take 220-440 mg by mouth 2 (two) times daily as needed (pain).     quinapril (ACCUPRIL) 20 MG tablet TAKE 1 TABLET BY MOUTH  EVERY DAY 90 tablet 0   rosuvastatin (CRESTOR) 40 MG tablet TAKE 1 TABLET BY MOUTH EVERY DAY 90 tablet 0   [DISCONTINUED] escitalopram (LEXAPRO) 10 MG tablet TAKE 1 TABLET BY MOUTH EVERY DAY 90 tablet 0   No current facility-administered medications on file prior to visit.    BP 128/80   Pulse 86   Temp 97.9 F (36.6 C) (Oral)   Ht 5\' 7"  (1.702 m)   Wt 180 lb (81.6 kg)   SpO2 99%   BMI 28.19 kg/m       Objective:   Physical Exam Vitals and nursing note reviewed.  Constitutional:      Appearance: Normal appearance.  Musculoskeletal:        General: Normal range of motion.  Skin:    General: Skin is warm and dry.     Capillary Refill: Capillary refill takes less than 2 seconds.     Comments: Appro 1.5 mm benign appear cyst on right temple   Neurological:     General: No focal deficit present.     Mental Status: She is alert and oriented to person, place, and time.   Psychiatric:        Mood and Affect: Mood normal.        Behavior: Behavior normal.        Thought Content: Thought content normal.        Judgment: Judgment normal.      Assessment & Plan:   1. Skin neoplasm -We discussed that the neoplasm appears to be benign.  She does report that they can become irritated and annoying when she is washing her face or putting on her make-up.  She is opted for removal. Procedure including risks/benefits explained to patient.  Questions were answered. After informed consent was obtained and a time out completed, site was cleansed with betadine and then alcohol. 1% Lidocaine with epinephrine was injected under lesion and then shave biopsy was performed. Area was cauterized to obtain hemostasis.  Pt tolerated procedure well.  Specimen sent for pathology review.  Pt instructed to keep the area dry for 24 hours and to contact us if he develops redness, drainage or swelling at the site.  Pt may use tylenol as needed for discomfort today.    - Dermatology pathology  Dorothyann Peng, NP

## 2021-05-18 ENCOUNTER — Ambulatory Visit (INDEPENDENT_AMBULATORY_CARE_PROVIDER_SITE_OTHER): Payer: Medicare Other | Admitting: Psychology

## 2021-05-18 DIAGNOSIS — F3289 Other specified depressive episodes: Secondary | ICD-10-CM

## 2021-05-20 ENCOUNTER — Other Ambulatory Visit: Payer: Self-pay | Admitting: Adult Health

## 2021-05-20 DIAGNOSIS — C4492 Squamous cell carcinoma of skin, unspecified: Secondary | ICD-10-CM

## 2021-05-24 ENCOUNTER — Ambulatory Visit
Admission: EM | Admit: 2021-05-24 | Discharge: 2021-05-24 | Disposition: A | Discharge status: Home / Self Care | Payer: Medicare Other | Attending: Internal Medicine | Admitting: Internal Medicine

## 2021-05-24 ENCOUNTER — Other Ambulatory Visit: Payer: Self-pay

## 2021-05-24 ENCOUNTER — Ambulatory Visit (INDEPENDENT_AMBULATORY_CARE_PROVIDER_SITE_OTHER): Payer: Medicare Other

## 2021-05-24 DIAGNOSIS — R059 Cough, unspecified: Secondary | ICD-10-CM | POA: Diagnosis not present

## 2021-05-24 DIAGNOSIS — R051 Acute cough: Secondary | ICD-10-CM | POA: Diagnosis not present

## 2021-05-24 MED ORDER — BENZONATATE 100 MG PO CAPS: 100.0000 mg | ORAL_CAPSULE | Freq: Three times a day (TID) | ORAL | 0 refills | Status: DC | PRN

## 2021-05-24 NOTE — ED Provider Notes (Signed)
[facility]    CSN: [id number] Arrival date & time: 05/24/21  1329      History   Chief Complaint Chief Complaint  Patient presents with   Cough    HPI Ellen [name] is a 65 y.o. female.   Patient presents with 1 week history of productive cough with yellow sputum.  Denies any nasal congestion, stuffy nose, sore throat, fever, shortness of breath, ear pain.  Denies any known sick contacts.  Patient has been taking Mucinex DM over-the-counter with minimal improvement in symptoms.  Denies any chronic lung diseases.   Cough  Past Medical History:  Diagnosis Date   Alcohol abuse    Depression    Hyperlipidemia    Hypertension    Seasonal allergies     Patient Active Problem List   Diagnosis Date Noted   Suicidal overdose, initial encounter ([place]) 01/20/2021   Tremors of nervous system 03/12/2020   Insomnia 03/12/2020   Hypertension    Hyperlipidemia    Anxiety and depression    Alcohol abuse     Past Surgical History:  Procedure Laterality Date   ABDOMINAL HYSTERECTOMY  1998    OB History   No obstetric history on file.      Home Medications    Prior to Admission medications   Medication Sig Start Date End Date Taking? Authorizing Provider  benzonatate (TESSALON) 100 MG capsule Take 1 capsule (100 mg total) by mouth every 8 (eight) hours as needed for cough. 05/24/21  Yes Odis Luster, FNP  benztropine (COGENTIN) 1 MG tablet TAKE 1 TABLET BY MOUTH EVERY DAY 05/12/21   Nafziger, Tommi Rumps, NP  citalopram (CELEXA) 10 MG tablet TAKE 1 TABLET BY MOUTH EVERY DAY 05/11/21   Nafziger, Tommi Rumps, NP  donepezil (ARICEPT) 10 MG tablet Take 1 tablet (10 mg total) by mouth every morning. 02/12/21   Cameron Sprang, MD  ezetimibe (ZETIA) 10 MG tablet TAKE 1 TABLET BY MOUTH EVERY DAY 05/12/21   Nafziger, Tommi Rumps, NP  fluticasone (FLONASE) 50 MCG/ACT nasal spray Place 2 sprays into both nostrils daily. 05/05/21   Nafziger, Tommi Rumps, NP  hydrochlorothiazide (HYDRODIURIL)  25 MG tablet TAKE 1 TABLET (25 MG TOTAL) BY MOUTH DAILY. 05/12/21   Nafziger, Tommi Rumps, NP  Magnesium 250 MG TABS Take 250 mg by mouth daily as needed (cramps).    [provider]  Multiple Vitamin (MULTIVITAMIN WITH MINERALS) TABS tablet Take 1 tablet by mouth every morning. ALIVE    [provider]  naproxen sodium (ALEVE) 220 MG tablet Take 220-440 mg by mouth 2 (two) times daily as needed (pain).    [provider]  quinapril (ACCUPRIL) 20 MG tablet TAKE 1 TABLET BY MOUTH EVERY DAY 05/12/21   Nafziger, Tommi Rumps, NP  rosuvastatin (CRESTOR) 40 MG tablet TAKE 1 TABLET BY MOUTH EVERY DAY 05/12/21   Nafziger, Tommi Rumps, NP  escitalopram (LEXAPRO) 10 MG tablet TAKE 1 TABLET BY MOUTH EVERY DAY 01/19/21 01/22/21  Cameron Sprang, MD    Family History Family History  Problem Relation Age of Onset   High Cholesterol Mother    High blood pressure Mother    Diabetes Mother    High blood pressure Father    High Cholesterol Father    Diabetes Father    High blood pressure Brother    High Cholesterol Brother     Social History Social History   Tobacco Use   Smoking status: Former   Smokeless tobacco: Never  Scientific laboratory technician  Use: Never used  Substance Use Topics   Alcohol use: Not Currently   Drug use: Never     Allergies   Patient has no known allergies.   Review of Systems Review of Systems Per HPI  Physical Exam Triage Vital Signs ED Triage Vitals  Enc Vitals Group     BP 05/24/21 1507 138/85     Pulse Rate 05/24/21 1507 (!) 56     Resp 05/24/21 1507 14     Temp 05/24/21 1507 (!) 97.5 F (36.4 C)     Temp Source 05/24/21 1507 Oral     SpO2 05/24/21 1507 93 %     Weight --      Height --      Head Circumference --      Peak Flow --      Pain Score 05/24/21 1517 0     Pain Loc --      Pain Edu? --      Excl. in [place]? --    No data found.  Updated Vital Signs BP 138/85 (BP Location: Left Arm)   Pulse (!) 56   Temp (!) 97.5 F (36.4 C) (Oral)   Resp  14   SpO2 93%   Visual Acuity Right Eye Distance:   Left Eye Distance:   Bilateral Distance:    Right Eye Near:   Left Eye Near:    Bilateral Near:     Physical Exam Constitutional:      General: She is not in acute distress.    Appearance: Normal appearance. She is not toxic-appearing or diaphoretic.  HENT:     Head: Normocephalic and atraumatic.     Right Ear: Tympanic membrane and ear canal normal.     Left Ear: Tympanic membrane and ear canal normal.     Nose: Nose normal.     Mouth/Throat:     Mouth: Mucous membranes are moist.     Pharynx: No posterior oropharyngeal erythema.  Eyes:     Extraocular Movements: Extraocular movements intact.     Conjunctiva/sclera: Conjunctivae normal.     Pupils: Pupils are equal, round, and reactive to light.  Cardiovascular:     Rate and Rhythm: Normal rate and regular rhythm.     Pulses: Normal pulses.     Heart sounds: Normal heart sounds.  Pulmonary:     Effort: Pulmonary effort is normal. No respiratory distress.     Breath sounds: Normal breath sounds. No stridor. No wheezing or rhonchi.  Skin:    General: Skin is warm and dry.  Neurological:     General: No focal deficit present.     Mental Status: She is alert and oriented to person, place, and time. Mental status is at baseline.  Psychiatric:        Mood and Affect: Mood normal.        Behavior: Behavior normal.        Thought Content: Thought content normal.        Judgment: Judgment normal.     UC Treatments / Results  Labs (all labs ordered are listed, but only abnormal results are displayed) Labs Reviewed - No data to display  EKG   Radiology DG Chest 2 View  Result Date: 05/24/2021 CLINICAL DATA:  Persistent cough for 1 week. EXAM: CHEST - 2 VIEW COMPARISON:  January 19, 2021 FINDINGS: The heart size and mediastinal contours are within normal limits. Both lungs are clear. The visualized skeletal structures are unremarkable. IMPRESSION: No active  cardiopulmonary disease. Electronically Signed   By: Abelardo Diesel M.D.   On: 05/24/2021 16:20    Procedures Procedures (including critical care time)  Medications Ordered in UC Medications - No data to display  Initial Impression / Assessment and Plan / UC Course  I have reviewed the triage vital signs and the nursing notes.  Pertinent labs & imaging results that were available during my care of the patient were reviewed by me and considered in my medical decision making (see chart for details).     Patient presents with symptoms likely from a viral respiratory infection. Differential includes bacterial pneumonia, sinusitis, allergic rhinitis, Covid 19, viral bronchitis. Do not suspect underlying cardiopulmonary process. Symptoms seem unlikely related to ACS, CHF or COPD exacerbations, pneumonia, pneumothorax. Patient is nontoxic appearing and not in need of emergent medical intervention.  Chest x-ray was negative for any acute cardiopulmonary process.  Patient declined COVID-19 testing.  Prescribe benzonatate to take as needed for cough.  Unable to prescribe prednisone at this time as patient recently received steroid injection in the knee a few days prior at PCP.  Patient advised to follow-up with PCP for further evaluation and management of cough.  Return if symptoms fail to improve in 1-2 weeks or you develop shortness of breath, chest pain, severe headache. Patient states understanding and is agreeable.  Discharged with PCP followup.  Final Clinical Impressions(s) / UC Diagnoses   Final diagnoses:  Acute cough     Discharge Instructions      Your x-ray was normal.  You have been prescribed as noted to take as needed for cough.  Please follow-up with primary care for further evaluation and management.     ED Prescriptions     Medication Sig Dispense Auth. Provider   benzonatate (TESSALON) 100 MG capsule Take 1 capsule (100 mg total) by mouth every 8 (eight) hours as  needed for cough. 21 capsule Odis Luster, FNP      PDMP not reviewed this encounter.   Odis Luster, [place] 05/24/21 [phone]

## 2021-05-24 NOTE — Discharge Instructions (Signed)
Your x-ray was normal.  You have been prescribed as noted to take as needed for cough.  Please follow-up with primary care for further evaluation and management.

## 2021-05-24 NOTE — ED Triage Notes (Signed)
One week h/o productive cough.  Denies congestion, rhinorrhea and sore throat. Has been taking OTC cough meds without a decrease in sxs. Pt is covid vaccinated and boosted.

## 2021-05-25 ENCOUNTER — Encounter: Payer: Self-pay | Admitting: Counselor

## 2021-05-25 ENCOUNTER — Ambulatory Visit (INDEPENDENT_AMBULATORY_CARE_PROVIDER_SITE_OTHER): Payer: Medicare Other | Admitting: Psychology

## 2021-05-25 ENCOUNTER — Ambulatory Visit: Payer: Medicare Other | Admitting: Psychology

## 2021-05-25 ENCOUNTER — Ambulatory Visit (INDEPENDENT_AMBULATORY_CARE_PROVIDER_SITE_OTHER): Payer: Medicare Other | Admitting: Counselor

## 2021-05-25 DIAGNOSIS — F3289 Other specified depressive episodes: Secondary | ICD-10-CM

## 2021-05-25 DIAGNOSIS — R419 Unspecified symptoms and signs involving cognitive functions and awareness: Secondary | ICD-10-CM

## 2021-05-25 DIAGNOSIS — F09 Unspecified mental disorder due to known physiological condition: Secondary | ICD-10-CM

## 2021-05-25 DIAGNOSIS — F1021 Alcohol dependence, in remission: Secondary | ICD-10-CM | POA: Diagnosis not present

## 2021-05-25 NOTE — Progress Notes (Signed)
   Psychometrist Note   Cognitive testing was administered to Ellen [name] by Ellen [name], B.S. (Technician) under the supervision of Alphonzo Severance, Psy.D., ABN. Ellen [name] was able to tolerate all test procedures. Ellen [name] met with the patient as needed to manage any emotional reactions to the testing procedures. Rest breaks were offered.    The battery of tests administered was selected by Ellen [name] with consideration to the patient's current level of functioning, the nature of her symptoms, emotional and behavioral responses during the interview, level of literacy, observed level of motivation/effort, and the nature of the referral question. This battery was communicated to the psychometrist. Communication between Ellen [name] and the psychometrist was ongoing throughout the evaluation and Ellen [name] was immediately accessible at all times. Ellen [name] provided supervision to the technician on the date of this service, to the extent necessary to assure the quality of all services provided.    Ellen [name] will return in approximately one week for an interactive feedback session with Ellen [name], at which time test performance, clinical impressions, and treatment recommendations will be reviewed in detail. The patient understands she can contact our office should she require our assistance before this time.   A total of 105 minutes of billable time were spent with Ellen [name] by the technician, including test administration and scoring time. Billing for these services is reflected in Ellen [name] note.   This note reflects time spent with the psychometrician and does not include test scores, clinical history, or any interpretations made by Ellen [name]. The full report will follow in a separate note.
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
## 2021-05-26 ENCOUNTER — Telehealth: Payer: Self-pay | Admitting: Dermatology

## 2021-05-26 NOTE — Telephone Encounter (Signed)
Please contact [facility] + tell them to try and get her in elsewhere before April

## 2021-05-26 NOTE — Telephone Encounter (Signed)
Notes documented and referral routed back to referring office. 

## 2021-05-26 NOTE — Progress Notes (Signed)
[facility]  Patient Name: Gordie Belvin MRN: [id number] Date of Birth: 1956/06/22 Age: 65 y.o. Education: 12 years  Measurement properties of test scores: IQ, Index, and Standard Scores (SS): Mean = 100; Standard Deviation = 15 Scaled Scores (Ss): Mean = 10; Standard Deviation = 3 Z scores (Z): Mean = 0; Standard Deviation = 1 T scores (T); Mean = 50; Standard Deviation = 10  TEST SCORES:    Note: This summary of test scores accompanies the interpretive report and should not be interpreted by unqualified individuals or in isolation without reference to the report. Test scores are relative to age, gender, and educational history as available and appropriate.   Performance Validity        ACS: Raw Descriptor      Word Choice: 47 Within Expectation      Rey 15 and Recognition: Raw Descriptor      Free Recall 9 Below Expectation      Recognition 13 ---      False Positives 7 ---      Combined Score 22 Within Expectation          The Dot Counting Test: Raw Descriptor      E-Score 18 Within Expectation      Embedded Measures: Raw Descriptor      RBANS Effort Index: 0 Within Expectation      WAIS-IV Reliable Digit Span 7 Within Expectation      WAIS-IV Reliable Digit Span Revised 9 Below Expectation      Expected Functioning        Wide Range Achievement Test (Word Reading): Standard/Scaled Score Percentile       Word Reading 109 73      Cognitive Testing        RBANS, Form : Standard/Scaled Score Percentile  Total Score 90 25  Immediate Memory 81 10      List Learning 4 2      Story Memory 9 37  Visuospatial/Constructional 92 30      Figure Copy   (18) 10 50      Judgment of Line Orientation   (15) --- 26-50  Language 96 39      Picture Naming --- 17-25      Semantic Fluency 8 25  Attention 112 79      Digit Span 16 98      Coding 8 25  Delayed Memory 84 14      List Recall   (4) --- 26-50      List Recognition    (18) --- 10-16      Story Recall   (8) 9 37      Figure Recall   (11) 8 25      Wechsler Adult Intelligence Scale - IV: Standard/Scaled Score Percentile  Working Memory Index 74 4      Digit Span 6 9          Digit Span Forward 11 63          Digit Span Backward 5 5          Digit Span Sequencing 3 1      Arithmetic 5 5  Processing Speed Index 68 2      Symbol Search 1 <1      Coding 7 16      Neuropsychological Assessment Battery (Language Module): T-score Percentile      Naming   (31) 58 79  Verbal Fluency: T-score Percentile      Controlled Oral Word Association (F-A-S) 64 92      Semantic Fluency (Animals) 44 27      Trail Making Test: T-Score Percentile      Part A 44 27      Part B 34 5      Modified Wisconsin Card Sorting Test (MWCST): Standard/T-Score Percentile      Number of Categories Correct 36 8      Number of Perseverative Errors 64 92      Number of Total Errors 47 38      Percent Perseverative Errors 63 91  Executive Function Composite 100 50      Boston Diagnostic Aphasia Exam: Raw Score Scaled Score      Complex Ideational Material 10 7      Rating Scales         Raw Score Descriptor  Patient Health Questionnaire - 9 2 Within Normal Limits  GAD-7 2 Within Normal Limits   Fran Neiswonger V. Nicole Kindred PsyD, [place] Clinical Neuropsychologist

## 2021-05-28 DIAGNOSIS — F1021 Alcohol dependence, in remission: Secondary | ICD-10-CM

## 2021-05-28 HISTORY — DX: Alcohol dependence, in remission: F10.21

## 2021-05-28 NOTE — Progress Notes (Signed)
[facility]  Patient Name: Ellen [name] MRN: [id number] Date of Birth: 03-03-1956 Age: 65 y.o. Education: 12 years  Clinical Impressions  Ellen [name] is a 65 y.o., right-hand dominant, partnered (common law marriage) woman with a history of substance dependence and then cognitive changes after detoxification from alcohol in 2019. It was difficult to disentangle cognitive problems from other issues given that the patient was also getting used to living sober after having drank heavily for almost 50 years, she was "stressed and depressed," and was also dealing with her daughter who had some issues of her own around the time that she started having cognitive problems. The patient's distress culminated in a suicide attempt via trazodone overdose in June, 2022, which got her into mental health treatment, and she has been doing much better since then. She reported that she has returned to the activities that she had previously abandoned, passed a driver's evaluation, and her MoCA went from a 12/30 in March, 2022 to a 26/30 in June, 2022.   On neuropsychological evaluation, there are some marginal to low scores on performance validity measures with visual processing task demands. Given that this was one of the patient's greatest areas of difficulty (qualitatively speaking), it is possible that these reflect difficulties with underlying task demands as opposed to a validity problem per se but findings are still interpreted with some appropriate caution. She demonstrated low scores on various measures involving visual processing including one measure of efficient visual scanning and efficient visual matching, another measure involving visual scanning while sequencing numbers and letters, on dot counting, and she also seemed to have difficulties not skipping around on single word reading. Beyond that she demonstrated low scores on measures involving working  memory. Findings in other areas were essentially within normal limits. Importantly, she does not have a clear memory storage problem and demonstrated reasonable acquisition and retention of information across time (although she did have some difficulties with encoding unstructured verbal information). She screened negative for the presence of depression on self-rating.   Overall performance and presentation are viewed as suggestive of some level of cognitive decline although the extent of the patient's difficulties appears to be quite mild. Primary difficulties appear to be with processing of visual information and also with aspects of working memory. These deficits could certainly be related to her history of alcohol use (and in fact visuospatial processing problems are quite classic for this etiology) although they may also be related to anticholinergic medications or other factors. She does not appear to have any findings concerning for expected pattern in Wernicke-Korsakoff (I.e., amnestic memory problems) or in progressive causes such as AD. Recommend continued abstinence from alcohol, engaging in exercise, eating a healthy diet, and other lifestyle changes for primary prevention.   Diagnostic Impressions: Mild neurocognitive disorder  Recommendations to be discussed with patient  Your performance and presentation on assessment were consistent with some mild cognitive problems. These difficulties mainly involve problems with processing of visual information and working memory (the ability to hold information online and do something to it). I think that these deficits are most likely due to your history of alcohol use, although there are other factors that could be considered. Fortunately, these difficulties are very mild and are not indicative of any of the types of severe cognitive problems that we can see in alcohol use such as Wernicke-Korsakoff syndrome or alcoholic dementia.   I think that mild  neurocognitive disorder is the best diagnosis. This is  a term for problems with memory and thinking that are detectable in testing and may be somewhat interfering in one's day-to-day life, although they do not compromise your capacity for independence.  They also do not typically get worse over time, unless they are due to an underlying condition.  In your case, I think having an underlying condition is unlikely and that these could be caused by your history of alcohol use; therefore, they may not get worse if you continue to abstain.    To minimize the chance of any decline in the future, I would recommend that you engage in normal healthy lifestyle behaviors. This includes getting recommended (usually 8 hours) amounts of sleep, eating a healthy diet rich in unprocessed foods such as vegetables and whole grains, assertively monitoring and managing underlying medical conditions that can contribute to cognitive impairment (e.g., hypertension, high cholesterol, etc.), getting regular exercise (preferably 20-30 minutes a day), and engaging in satisfying social interactions with others.   You may wish to investigate is your medication regimen. Specifically, you are on Cogentin, an anticholinergic medication used to treat tremor. Anticholinergic medications block neuro receptor systems in the brain that have been implicated in memory and cognitive functioning and can cause cognitive problems. They may also confer some risk for the later development of dementia, although that is speculative. Please do not change the manner in which you take your medications without first discussing it with the prescribing provider. You should know that it may not be possible to treat all your symptoms that having some cognitive side effects.  You stated that you are trying to find out "how you are doing" so that you can plan where to go from here. I do not see any reason why you would have any particular limitation in any area. I do  think it would be important to maintain psychiatric stability, such as through regular participation in psychotherapy, continuing your involvement in AA, and the like. You are in the capable hands of Ellen Bales, LCSW who can help you in the pursuit of continued balance and mental health.  Test Findings  Test scores are summarized in additional documentation associated with this encounter. Test scores are relative to age, gender, and educational history as available and appropriate. There were no concerns about performance validity as all findings fell within normal expectations.   General Intellectual Functioning/Achievement:  Single word reading was toward the upper end of the average range.  Attention and Processing Efficiency: Performance on indicators of working memory was weak, with an unusually low overall score on the working memory index of the WAIS-IV. Digit repetition forward was very good, with average to very superior performance on 2 different indicators. By contrast, she had marked difficulties with digit repetition backward, digit re-sequencing in ascending order, and solving mental math problems without paper and pencil, with unusually low scores on all 3 measures.  Processing speed performance may have been undermined by visual processing task demands. She demonstrated low average to average timed number symbol coding. Performance was extremely low on a measure of efficient visual scanning, efficient visual matching, and processing of visuospatial information. I do think the latter test demand undermined her score as opposed to this being a primary processing speed problem.  Language: Visual object confrontation naming was within normal limits, with errorless performance. Generation of words was very good, falling in the superior range in response to the letters F-A-S and in the average range in response to the category prompt "animals."  Visuospatial  Function: While performance  fell at an average level overall on this domain, there were indications of qualitative problems with visual scanning and processing of visual information under time pressure. She had a hard time with visual scanning when reading words, sequencing numbers and letters, and on one measure of processing speed involving efficient visual matching, scanning, and processing. She did better on measures of more basic abilities, with average figure copy performance and judgment of angular line orientations.  Learning and Memory: Performance on memory measures was consistent with fairly good capacities, although there was some variability in respect to learning due to a low score when learning a word list. Nevertheless, her delayed recall for both visual and verbal information was reasonable.  In the visual realm, immediate recall for a 10-item word list was extremely low whereas immediate recall for short story was average. Her overall level of immediate recall was low average. Following a standard delay, her delayed recall of the word list was average of 4 words. Her recognition for the items from the list versus false choices was low average. Her delayed recall for the short story was good, with an average range score.  In the visual realm, her delayed recall for a modestly complex geometric figure stimulus was average.  Executive Functions: Performance on executive indicators was generally reasonable, although the patient did have a low score on alternating sequencing of numbers and letters of the alphabet, which I suspect is on the basis of visual scanning task demands. She did better on simple measure of numeric sequencing that involves visual scanning, although this is also much easier task. Performance was reasonable on the Modified [employer] with the Executive Function Composite in the average range. She had an unusually low number of categories correct but her perseverative area score was  very good, in the superior range. Generation of words in response to letters F-A-S was also superior. She performed at a low average level and reasoning with verbal information on the complex ideational material.  Rating Scale(s): Ellen [name] screened negative for the presence of depression and anxiety on self rating scales. She presented as fairly euthymic on interview, and reported that she is doing much better since getting into treatment.  Ellen Simas Nicole Kindred, PsyD, ABN Clinical Neuropsychologist  Coding and Compliance  Billing below reflects technician time, my direct face-to-face time with the patient, time spent in test administration, and time spent in professional activities including but not limited to: neuropsychological test interpretation, integration of neuropsychological test data with clinical history, report preparation, treatment planning, care coordination, and review of diagnostically pertinent medical history or studies.   Services associated with this encounter: Clinical Interview [phone]) plus 165 minutes ([id number]; Neuropsychological Evaluation by Professional)  105 minutes ([id number]; Neuropsychological Testing by Technician)

## 2021-05-29 ENCOUNTER — Encounter: Payer: Medicare Other | Admitting: Counselor

## 2021-06-01 ENCOUNTER — Ambulatory Visit: Payer: Medicare Other | Admitting: Psychology

## 2021-06-08 ENCOUNTER — Other Ambulatory Visit: Payer: Self-pay

## 2021-06-08 ENCOUNTER — Ambulatory Visit (INDEPENDENT_AMBULATORY_CARE_PROVIDER_SITE_OTHER): Payer: Medicare Other | Admitting: Counselor

## 2021-06-08 ENCOUNTER — Ambulatory Visit (INDEPENDENT_AMBULATORY_CARE_PROVIDER_SITE_OTHER): Payer: Medicare Other | Admitting: Psychology

## 2021-06-08 ENCOUNTER — Encounter: Payer: Self-pay | Admitting: Counselor

## 2021-06-08 DIAGNOSIS — F1021 Alcohol dependence, in remission: Secondary | ICD-10-CM

## 2021-06-08 DIAGNOSIS — F3289 Other specified depressive episodes: Secondary | ICD-10-CM

## 2021-06-08 DIAGNOSIS — G3184 Mild cognitive impairment, so stated: Secondary | ICD-10-CM

## 2021-06-08 HISTORY — DX: Mild cognitive impairment of uncertain or unknown etiology: G31.84

## 2021-06-08 NOTE — Progress Notes (Signed)
NEUROPSYCHOLOGY FEEDBACK NOTE [facility]  Feedback Note: I met with [name] to review the findings resulting from her neuropsychological evaluation. Since the last appointment, she has been well. She went on a fishing trip near [place] and caught some fish. She had a good time. She went to the bar with her friends but was the DD and was still able to have a good time without drinking. Time was spent reviewing the impressions and recommendations that are detailed in the evaluation report. We discussed impression of mild neurocognitive disroder, which could be due to any different number of things. I do not think it is neurodegenerative and the profile is not concerning for Wernicke-Korsakoff or frank alcoholic dementia. Topics of discussion and recommendations are as reflected in the patient instructions. I took time to explain the findings and answer all the patient's questions. I encouraged Ms. Bertini to contact me should she have any further questions or if further follow up is desired.   Current Medications and Medical History   Current Outpatient Medications  Medication Sig Dispense Refill   benzonatate (TESSALON) 100 MG capsule Take 1 capsule (100 mg total) by mouth every 8 (eight) hours as needed for cough. 21 capsule 0   benztropine (COGENTIN) 1 MG tablet TAKE 1 TABLET BY MOUTH EVERY DAY 90 tablet 1   citalopram (CELEXA) 10 MG tablet TAKE 1 TABLET BY MOUTH EVERY DAY 90 tablet 1   donepezil (ARICEPT) 10 MG tablet Take 1 tablet (10 mg total) by mouth every morning. 30 tablet 11   ezetimibe (ZETIA) 10 MG tablet TAKE 1 TABLET BY MOUTH EVERY DAY 90 tablet 0   fluticasone (FLONASE) 50 MCG/ACT nasal spray Place 2 sprays into both nostrils daily. 16 g 6   hydrochlorothiazide (HYDRODIURIL) 25 MG tablet TAKE 1 TABLET (25 MG TOTAL) BY MOUTH DAILY. 90 tablet 0   Magnesium 250 MG TABS Take 250 mg by mouth daily as needed (cramps).     Multiple Vitamin (MULTIVITAMIN WITH  MINERALS) TABS tablet Take 1 tablet by mouth every morning. ALIVE     naproxen sodium (ALEVE) 220 MG tablet Take 220-440 mg by mouth 2 (two) times daily as needed (pain).     quinapril (ACCUPRIL) 20 MG tablet TAKE 1 TABLET BY MOUTH EVERY DAY 90 tablet 0   rosuvastatin (CRESTOR) 40 MG tablet TAKE 1 TABLET BY MOUTH EVERY DAY 90 tablet 0   No current facility-administered medications for this visit.    Patient Active Problem List   Diagnosis Date Noted   Alcohol dependence in sustained full remission ([place]) 05/28/2021   Suicidal overdose, initial encounter ([place]) 01/20/2021   Tremors of nervous system 03/12/2020   Insomnia 03/12/2020   Hypertension    Hyperlipidemia    Anxiety and depression    Alcohol abuse     Mental Status and Behavioral Observations  Valery Amedee presented on time to the present encounter and was alert and generally oriented. Speech was normal in rate, rhythm, volume, and prosody. Self-reported mood was "good" and affect was euthymic. Thought process was logical and goal-oriented and thought content was appropriate to the topics discussed. There were no safety concerns identified at today's encounter, such as thoughts of harming self or others.   Plan  Feedback provided regarding the patient's neuropsychological evaluation. She is doing very well, she continues in her sobriety. I think she has some mild cognitive problems but that these may continue to improve. Also encouraged her to discuss meds with Tommi Rumps, she  doesn't notice much Tremor (wasn't sure if she is definitely still taking Cogentin and will check). Shiloh Southern was encouraged to contact me if any questions arise or if further follow up is desired.   Viviano Simas Nicole Kindred, PsyD, ABN Clinical Neuropsychologist  Service(s) Provided at This Encounter: 26 minutes [phone]; Psychotherapy with patient/family)

## 2021-06-08 NOTE — Patient Instructions (Signed)
Your performance and presentation on assessment were consistent with some mild cognitive problems. These difficulties mainly involve problems with processing of visual information and working memory (the ability to hold information online and do something to it). I think that these deficits are most likely due to your history of alcohol use, although there are other factors that could be considered. Fortunately, these difficulties are very mild and are not indicative of any of the types of severe cognitive problems that we can see in alcohol use such as Wernicke-Korsakoff syndrome or alcoholic dementia.   I think that mild neurocognitive disorder is the best diagnosis. This is a term for problems with memory and thinking that are detectable in testing and may be somewhat interfering in one's day-to-day life, although they do not compromise your capacity for independence.  They also do not typically get worse over time, unless they are due to an underlying condition.  In your case, I think having an underlying condition is unlikely and that these could be caused by your history of alcohol use; therefore, they may not get worse if you continue to abstain.     To minimize the chance of any decline in the future, I would recommend that you engage in normal healthy lifestyle behaviors. This includes getting recommended (usually 8 hours) amounts of sleep, eating a healthy diet rich in unprocessed foods such as vegetables and whole grains, assertively monitoring and managing underlying medical conditions that can contribute to cognitive impairment (e.g., hypertension, high cholesterol, etc.), getting regular exercise (preferably 20-30 minutes a day), and engaging in satisfying social interactions with others.    You may wish to investigate is your medication regimen. Specifically, you are on Cogentin, an anticholinergic medication used to treat tremor. Anticholinergic medications block neuro receptor systems in the  brain that have been implicated in memory and cognitive functioning and can cause cognitive problems. They may also confer some risk for the later development of dementia, although that is speculative. Please do not change the manner in which you take your medications without first discussing it with the prescribing provider. You should know that it may not be possible to treat all your symptoms that having some cognitive side effects.  You stated that you are trying to find out "how you are doing" so that you can plan where to go from here. I do not see any reason why you would have any particular limitation in any area. I do think it would be important to maintain psychiatric stability, such as through regular participation in psychotherapy, continuing your involvement in AA, and the like. You are in the capable hands of Debbe Bales, LCSW who can help you in the pursuit of continued balance and mental health.

## 2021-06-11 ENCOUNTER — Encounter: Payer: Medicare Other | Admitting: Counselor

## 2021-06-15 ENCOUNTER — Ambulatory Visit (INDEPENDENT_AMBULATORY_CARE_PROVIDER_SITE_OTHER): Payer: Medicare Other | Admitting: Psychology

## 2021-06-15 DIAGNOSIS — F3289 Other specified depressive episodes: Secondary | ICD-10-CM

## 2021-06-16 ENCOUNTER — Encounter: Payer: Medicare Other | Admitting: Counselor

## 2021-06-17 ENCOUNTER — Encounter: Payer: Self-pay | Admitting: Adult Health

## 2021-06-18 NOTE — Telephone Encounter (Signed)
Hello Ellen [name] is it possible to send to [place]?

## 2021-06-22 ENCOUNTER — Ambulatory Visit (INDEPENDENT_AMBULATORY_CARE_PROVIDER_SITE_OTHER): Payer: Medicare Other | Admitting: Psychology

## 2021-06-22 DIAGNOSIS — F3289 Other specified depressive episodes: Secondary | ICD-10-CM

## 2021-06-23 ENCOUNTER — Encounter: Payer: Medicare Other | Admitting: Counselor

## 2021-07-05 ENCOUNTER — Other Ambulatory Visit: Payer: Self-pay

## 2021-07-05 ENCOUNTER — Ambulatory Visit (INDEPENDENT_AMBULATORY_CARE_PROVIDER_SITE_OTHER): Payer: Medicare Other

## 2021-07-05 ENCOUNTER — Ambulatory Visit
Admission: EM | Admit: 2021-07-05 | Discharge: 2021-07-05 | Disposition: A | Discharge status: Home / Self Care | Payer: Medicare Other | Attending: Internal Medicine | Admitting: Internal Medicine

## 2021-07-05 DIAGNOSIS — R059 Cough, unspecified: Secondary | ICD-10-CM | POA: Diagnosis not present

## 2021-07-05 DIAGNOSIS — R062 Wheezing: Secondary | ICD-10-CM | POA: Diagnosis not present

## 2021-07-05 DIAGNOSIS — J069 Acute upper respiratory infection, unspecified: Secondary | ICD-10-CM

## 2021-07-05 MED ORDER — ALBUTEROL SULFATE HFA 108 (90 BASE) MCG/ACT IN AERS
1.0000 | INHALATION_SPRAY | Freq: Once | RESPIRATORY_TRACT | Status: AC
  Administered 2021-07-05: 1 via RESPIRATORY_TRACT

## 2021-07-05 MED ORDER — PREDNISONE 20 MG PO TABS: 40.0000 mg | ORAL_TABLET | Freq: Every day | ORAL | 0 refills | Status: AC

## 2021-07-05 MED ORDER — BENZONATATE 100 MG PO CAPS: 100.0000 mg | ORAL_CAPSULE | Freq: Three times a day (TID) | ORAL | 0 refills | Status: DC | PRN

## 2021-07-05 MED ORDER — DOXYCYCLINE HYCLATE 100 MG PO CAPS: 100.0000 mg | ORAL_CAPSULE | Freq: Two times a day (BID) | ORAL | 0 refills | Status: DC

## 2021-07-05 NOTE — ED Provider Notes (Signed)
[facility]    CSN: [id number] Arrival date & time: 07/05/21  7619      History   Chief Complaint Chief Complaint  Patient presents with   Cough   Nasal Congestion    HPI Ellen [name] is a 65 y.o. female.   Patient presents with nonproductive cough and nasal congestion that has been intermittent for the past 1.5 months.  Patient was seen on 05/24/2021 for cough.  This cough has been persistent and intermittent since this visit.  Was prescribed benzonatate with no improvement in symptoms.  Patient has also been taking Alka-Seltzer cold medication and other over-the-counter medications with no improvement or decrease in symptoms.  Patient denies any fever, pain, shortness of breath, nausea, vomiting, diarrhea, abdominal pain.  Patient does report that she feels she is wheezing.  Denies history of asthma or COPD.   Cough  Past Medical History:  Diagnosis Date   Alcohol abuse    Depression    Hyperlipidemia    Hypertension    Seasonal allergies     Patient Active Problem List   Diagnosis Date Noted   Alcohol dependence in sustained full remission ([place]) 05/28/2021   Suicidal overdose, initial encounter ([place]) 01/20/2021   Tremors of nervous system 03/12/2020   Insomnia 03/12/2020   Hypertension    Hyperlipidemia    Anxiety and depression    Alcohol abuse     Past Surgical History:  Procedure Laterality Date   ABDOMINAL HYSTERECTOMY  1998    OB History   No obstetric history on file.      Home Medications    Prior to Admission medications   Medication Sig Start Date End Date Taking? Authorizing Provider  doxycycline (VIBRAMYCIN) 100 MG capsule Take 1 capsule (100 mg total) by mouth 2 (two) times daily. 07/05/21  Yes , Hildred Alamin E, FNP  predniSONE (DELTASONE) 20 MG tablet Take 2 tablets (40 mg total) by mouth daily for 5 days. 07/05/21 07/10/21 Yes , Michele Rockers, FNP  benzonatate (TESSALON) 100 MG capsule Take 1 capsule (100 mg total) by  mouth every 8 (eight) hours as needed for cough. 07/05/21   Teodora Medici, FNP  benztropine (COGENTIN) 1 MG tablet TAKE 1 TABLET BY MOUTH EVERY DAY 05/12/21   Nafziger, Tommi Rumps, NP  citalopram (CELEXA) 10 MG tablet TAKE 1 TABLET BY MOUTH EVERY DAY 05/11/21   Nafziger, Tommi Rumps, NP  donepezil (ARICEPT) 10 MG tablet Take 1 tablet (10 mg total) by mouth every morning. 02/12/21   Cameron Sprang, MD  ezetimibe (ZETIA) 10 MG tablet TAKE 1 TABLET BY MOUTH EVERY DAY 05/12/21   Nafziger, Tommi Rumps, NP  fluticasone (FLONASE) 50 MCG/ACT nasal spray Place 2 sprays into both nostrils daily. 05/05/21   Nafziger, Tommi Rumps, NP  hydrochlorothiazide (HYDRODIURIL) 25 MG tablet TAKE 1 TABLET (25 MG TOTAL) BY MOUTH DAILY. 05/12/21   Nafziger, Tommi Rumps, NP  Magnesium 250 MG TABS Take 250 mg by mouth daily as needed (cramps).    [provider]  Multiple Vitamin (MULTIVITAMIN WITH MINERALS) TABS tablet Take 1 tablet by mouth every morning. ALIVE    [provider]  naproxen sodium (ALEVE) 220 MG tablet Take 220-440 mg by mouth 2 (two) times daily as needed (pain).    [provider]  quinapril (ACCUPRIL) 20 MG tablet TAKE 1 TABLET BY MOUTH EVERY DAY 05/12/21   Nafziger, Tommi Rumps, NP  rosuvastatin (CRESTOR) 40 MG tablet TAKE 1 TABLET BY MOUTH EVERY DAY 05/12/21   Dorothyann Peng, NP  escitalopram (LEXAPRO) 10 MG tablet TAKE 1 TABLET BY MOUTH EVERY DAY 01/19/21 01/22/21  Cameron Sprang, MD    Family History Family History  Problem Relation Age of Onset   High Cholesterol Mother    High blood pressure Mother    Diabetes Mother    High blood pressure Father    High Cholesterol Father    Diabetes Father    High blood pressure Brother    High Cholesterol Brother     Social History Social History   Tobacco Use   Smoking status: Former   Smokeless tobacco: Never  Scientific laboratory technician Use: Never used  Substance Use Topics   Alcohol use: Not Currently   Drug use: Never     Allergies   Patient has no known  allergies.   Review of Systems Review of Systems Per HPI  Physical Exam Triage Vital Signs ED Triage Vitals  Enc Vitals Group     BP 07/05/21 1202 (!) 146/72     Pulse Rate 07/05/21 1202 68     Resp 07/05/21 1202 18     Temp 07/05/21 1202 98.1 F (36.7 C)     Temp Source 07/05/21 1202 Oral     SpO2 07/05/21 1202 92 %     Weight --      Height --      Head Circumference --      Peak Flow --      Pain Score 07/05/21 1204 0     Pain Loc --      Pain Edu? --      Excl. in [place]? --    No data found.  Updated Vital Signs BP (!) 146/72 (BP Location: Left Arm)   Pulse 68   Temp 98.1 F (36.7 C) (Oral)   Resp 18   SpO2 93% Comment: rechecked by provider  Visual Acuity Right Eye Distance:   Left Eye Distance:   Bilateral Distance:    Right Eye Near:   Left Eye Near:    Bilateral Near:     Physical Exam Constitutional:      General: She is not in acute distress.    Appearance: Normal appearance. She is not toxic-appearing or diaphoretic.  HENT:     Head: Normocephalic and atraumatic.     Right Ear: Tympanic membrane and ear canal normal.     Left Ear: Tympanic membrane and ear canal normal.     Nose: Congestion present.     Mouth/Throat:     Mouth: Mucous membranes are moist.     Pharynx: No posterior oropharyngeal erythema.  Eyes:     Extraocular Movements: Extraocular movements intact.     Conjunctiva/sclera: Conjunctivae normal.     Pupils: Pupils are equal, round, and reactive to light.  Cardiovascular:     Rate and Rhythm: Normal rate and regular rhythm.     Pulses: Normal pulses.     Heart sounds: Normal heart sounds.  Pulmonary:     Effort: Pulmonary effort is normal. No respiratory distress.     Breath sounds: No stridor. Wheezing present. No rhonchi or rales.  Abdominal:     General: Abdomen is flat. Bowel sounds are normal.     Palpations: Abdomen is soft.  Musculoskeletal:        General: Normal range of motion.     Cervical back: Normal range  of motion.  Skin:    General: Skin is warm and dry.  Neurological:  General: No focal deficit present.     Mental Status: She is alert and oriented to person, place, and time. Mental status is at baseline.  Psychiatric:        Mood and Affect: Mood normal.        Behavior: Behavior normal.     UC Treatments / Results  Labs (all labs ordered are listed, but only abnormal results are displayed) Labs Reviewed - No data to display  EKG   Radiology DG Chest 2 View  Result Date: 07/05/2021 CLINICAL DATA:  Three day history of cough and congestion. EXAM: CHEST - 2 VIEW COMPARISON:  05/24/2021 FINDINGS: Lungs are adequately inflated without focal airspace consolidation or effusion. Cardiomediastinal silhouette and remainder of the exam is unchanged. IMPRESSION: No active cardiopulmonary disease. Electronically Signed   By: Marin Olp M.D.   On: 07/05/2021 13:28    Procedures Procedures (including critical care time)  Medications Ordered in UC Medications  albuterol (VENTOLIN HFA) 108 (90 Base) MCG/ACT inhaler 1 puff (1 puff Inhalation Given 07/05/21 1341)    Initial Impression / Assessment and Plan / UC Course  I have reviewed the triage vital signs and the nursing notes.  Pertinent labs & imaging results that were available during my care of the patient were reviewed by me and considered in my medical decision making (see chart for details).     Chest x-ray was negative for any acute cardiopulmonary process. 1 puff of albuterol administered in urgent care today to help alleviate wheezing and to see if oxygen saturation recovers.  Oxygen saturation ranging from 92 to 93%.  Oxygen was the same after albuterol administration.  Oxygen saturation seems consistent with previous office visits.  Albuterol inhaler not prescribed for patient to take at home due to patient taking Celexa and interaction with this medication.  Will prescribe prednisone steroid to decrease inflammation.   Patient requesting refill on benzonatate as this has been helpful for her slightly.  Will refill this medication.  Doxycycline antibiotic to help treat upper respiratory infection.  Patient is not in respiratory distress and vital signs are stable.  Oxygen saturation is consistent with previous visits so do think patient is okay for discharge.  Wheezing has improved.  Discussed strict return precautions.  Advised patient to go to the hospital if symptoms worsen.  Patient verbalized understanding and was agreeable plan. Final Clinical Impressions(s) / UC Diagnoses   Final diagnoses:  Viral upper respiratory tract infection with cough  Wheezing     Discharge Instructions      Chest x-ray was normal.  Please get a pulse oximeter from the pharmacy and monitor your oxygen.  Go to the hospital if symptoms or oxygen worsens.  You have been prescribed 3 medications to help alleviate your symptoms.     ED Prescriptions     Medication Sig Dispense Auth. Provider   benzonatate (TESSALON) 100 MG capsule Take 1 capsule (100 mg total) by mouth every 8 (eight) hours as needed for cough. 15 capsule [place], [place] E, [place]   predniSONE (DELTASONE) 20 MG tablet Take 2 tablets (40 mg total) by mouth daily for 5 days. 10 tablet [place], [place] E, [place]   doxycycline (VIBRAMYCIN) 100 MG capsule Take 1 capsule (100 mg total) by mouth 2 (two) times daily. 20 capsule Teodora Medici, [place]      PDMP not reviewed this encounter.   Teodora Medici, [place] 07/05/21 [id number]

## 2021-07-05 NOTE — Discharge Instructions (Signed)
Chest x-ray was normal.  Please get a pulse oximeter from the pharmacy and monitor your oxygen.  Go to the hospital if symptoms or oxygen worsens.  You have been prescribed 3 medications to help alleviate your symptoms.

## 2021-07-05 NOTE — ED Triage Notes (Signed)
3 day h/o cough and congestion. Has been taking alka seltzer and other otc meds w/o a decrease in sxs.

## 2021-07-21 ENCOUNTER — Ambulatory Visit: Payer: Medicare Other | Admitting: Psychology

## 2021-07-22 ENCOUNTER — Other Ambulatory Visit: Payer: Self-pay | Admitting: Adult Health

## 2021-07-22 DIAGNOSIS — N6012 Diffuse cystic mastopathy of left breast: Secondary | ICD-10-CM

## 2021-07-22 DIAGNOSIS — D249 Benign neoplasm of unspecified breast: Secondary | ICD-10-CM

## 2021-07-23 ENCOUNTER — Ambulatory Visit (INDEPENDENT_AMBULATORY_CARE_PROVIDER_SITE_OTHER): Payer: Medicare Other | Admitting: Adult Health

## 2021-07-23 ENCOUNTER — Encounter: Payer: Self-pay | Admitting: Adult Health

## 2021-07-23 VITALS — BP 112/80 | HR 85 | Temp 98.4°F | Ht 67.0 in | Wt 187.2 lb

## 2021-07-23 DIAGNOSIS — C4492 Squamous cell carcinoma of skin, unspecified: Secondary | ICD-10-CM | POA: Diagnosis not present

## 2021-07-23 DIAGNOSIS — R7303 Prediabetes: Secondary | ICD-10-CM

## 2021-07-23 LAB — POCT GLYCOSYLATED HEMOGLOBIN (HGB A1C): Hemoglobin A1C: 6.2 % — AB (ref 4.0–5.6)

## 2021-07-23 MED ORDER — ALBUTEROL SULFATE HFA 108 (90 BASE) MCG/ACT IN AERS: 2.0000 | INHALATION_SPRAY | Freq: Four times a day (QID) | RESPIRATORY_TRACT | 0 refills | Status: DC | PRN

## 2021-07-23 NOTE — Progress Notes (Signed)
Subjective:    Patient ID: Ellen [name], female    DOB: 08/01/1956, 65 y.o.   MRN: [id number]  HPI 65 year old female who  has a past medical history of Alcohol abuse, Depression, Hyperlipidemia, Hypertension, and Seasonal allergies.  She presents to the office today to be " checked for diabetes". She reports the being sober has caused her to eat more sweets and she has gained about 20 pounds over the last 6 months. She does have a history of pre diabetes. She continues to walk every morning and has been going to the [facility].   Lab Results  Component Value Date   HGBA1C 6.3 06/21/2018   Also, she had a biopsy taken back in September of a skin neoplasm of her right temple. The pathology report came back showing SCC. She was referred to dermatology at [facility] but could not get an appointment until April, so she did not take it. She is wondering if she can get referred some place else   Review of Systems See HPI   Past Medical History:  Diagnosis Date   Alcohol abuse    Depression    Hyperlipidemia    Hypertension    Seasonal allergies     Social History   Socioeconomic History   Marital status: Soil scientist    Spouse name: Not on file   Number of children: Not on file   Years of education: 16   Highest education level: Associate degree: occupational, Hotel manager, or vocational program  Occupational History   Not on file  Tobacco Use   Smoking status: Former   Smokeless tobacco: Never  Scientific laboratory technician Use: Never used  Substance and Sexual Activity   Alcohol use: Not Currently   Drug use: Never   Sexual activity: Not on file  Other Topics Concern   Not on file  Social History Narrative   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    Married to partner    Right handed   Drinks caffeine   Two story home      Social Determinants of Health   Financial Resource Strain: Medium Risk   Difficulty of Paying Living Expenses:  Somewhat hard  Food Insecurity: No Food Insecurity   Worried About Charity fundraiser in the Last Year: Never true   Arboriculturist in the Last Year: Never true  Transportation Needs: No Transportation Needs   Lack of Transportation (Medical): No   Lack of Transportation (Non-Medical): No  Physical Activity: Sufficiently Active   Days of Exercise per Week: 6 days   Minutes of Exercise per Session: 60 min  Stress: Stress Concern Present   Feeling of Stress : To some extent  Social Connections: Moderately Integrated   Frequency of Communication with Friends and Family: Twice a week   Frequency of Social Gatherings with Friends and Family: More than three times a week   Attends Religious Services: Never   Marine scientist or Organizations: Yes   Attends Archivist Meetings: 1 to 4 times per year   Marital Status: Living with partner  Intimate Partner Violence: Not on file    Past Surgical History:  Procedure Laterality Date   ABDOMINAL HYSTERECTOMY  1998    Family History  Problem Relation Age of Onset   High Cholesterol Mother    High blood pressure Mother    Diabetes Mother    High blood pressure Father  High Cholesterol Father    Diabetes Father    High blood pressure Brother    High Cholesterol Brother     No Known Allergies  Current Outpatient Medications on File Prior to Visit  Medication Sig Dispense Refill   benzonatate (TESSALON) 100 MG capsule Take 1 capsule (100 mg total) by mouth every 8 (eight) hours as needed for cough. 15 capsule 0   benztropine (COGENTIN) 1 MG tablet TAKE 1 TABLET BY MOUTH EVERY DAY 90 tablet 1   citalopram (CELEXA) 10 MG tablet TAKE 1 TABLET BY MOUTH EVERY DAY 90 tablet 1   donepezil (ARICEPT) 10 MG tablet Take 1 tablet (10 mg total) by mouth every morning. 30 tablet 11   doxycycline (VIBRAMYCIN) 100 MG capsule Take 1 capsule (100 mg total) by mouth 2 (two) times daily. 20 capsule 0   ezetimibe (ZETIA) 10 MG tablet  TAKE 1 TABLET BY MOUTH EVERY DAY 90 tablet 0   fluticasone (FLONASE) 50 MCG/ACT nasal spray Place 2 sprays into both nostrils daily. 16 g 6   hydrochlorothiazide (HYDRODIURIL) 25 MG tablet TAKE 1 TABLET (25 MG TOTAL) BY MOUTH DAILY. 90 tablet 0   Magnesium 250 MG TABS Take 250 mg by mouth daily as needed (cramps).     Multiple Vitamin (MULTIVITAMIN WITH MINERALS) TABS tablet Take 1 tablet by mouth every morning. ALIVE     naproxen sodium (ALEVE) 220 MG tablet Take 220-440 mg by mouth 2 (two) times daily as needed (pain).     quinapril (ACCUPRIL) 20 MG tablet TAKE 1 TABLET BY MOUTH EVERY DAY 90 tablet 0   [DISCONTINUED] escitalopram (LEXAPRO) 10 MG tablet TAKE 1 TABLET BY MOUTH EVERY DAY 90 tablet 0   No current facility-administered medications on file prior to visit.    BP 112/80 (BP Location: Left Arm, Patient Position: Sitting, Cuff Size: Normal)   Pulse 85   Temp 98.4 F (36.9 C) (Oral)   Ht 5\' 7"  (1.702 m)   Wt 187 lb 3.2 oz (84.9 kg)   SpO2 97%   BMI 29.32 kg/m       Objective:   Physical Exam Vitals and nursing note reviewed.  Constitutional:      Appearance: Normal appearance. She is obese.  Cardiovascular:     Rate and Rhythm: Normal rate and regular rhythm.     Pulses: Normal pulses.     Heart sounds: Normal heart sounds.  Pulmonary:     Effort: Pulmonary effort is normal.     Breath sounds: Normal breath sounds.  Skin:    General: Skin is warm and dry.     Capillary Refill: Capillary refill takes less than 2 seconds.  Neurological:     Mental Status: She is alert.      Assessment & Plan:  1. Pre-diabetes  - POCT glycosylated hemoglobin (Hb A1C)- 6.2  - Encouraged cutting back on sugars and carbs.  - Will recheck at next CPE   2. SCC (squamous cell carcinoma)  - Ambulatory referral to Dermatology  Dorothyann Peng, NP

## 2021-08-10 ENCOUNTER — Other Ambulatory Visit: Payer: Self-pay | Admitting: Adult Health

## 2021-08-10 DIAGNOSIS — R251 Tremor, unspecified: Secondary | ICD-10-CM

## 2021-08-11 ENCOUNTER — Other Ambulatory Visit: Payer: Self-pay | Admitting: Adult Health

## 2021-08-11 DIAGNOSIS — R251 Tremor, unspecified: Secondary | ICD-10-CM

## 2021-08-12 ENCOUNTER — Other Ambulatory Visit: Payer: Self-pay | Admitting: Adult Health

## 2021-08-12 DIAGNOSIS — R251 Tremor, unspecified: Secondary | ICD-10-CM

## 2021-08-14 ENCOUNTER — Other Ambulatory Visit: Payer: Self-pay | Admitting: Adult Health

## 2021-08-16 ENCOUNTER — Encounter: Payer: Self-pay | Admitting: Adult Health

## 2021-08-18 NOTE — Telephone Encounter (Signed)
Please advise 

## 2021-08-20 ENCOUNTER — Ambulatory Visit: Payer: Medicare PPO | Admitting: Adult Health

## 2021-08-20 ENCOUNTER — Encounter: Payer: Self-pay | Admitting: Adult Health

## 2021-08-20 VITALS — BP 132/82 | HR 65 | Temp 98.3°F | Ht 67.0 in | Wt 194.0 lb

## 2021-08-20 DIAGNOSIS — R7303 Prediabetes: Secondary | ICD-10-CM

## 2021-08-20 MED ORDER — OZEMPIC (0.25 OR 0.5 MG/DOSE) 2 MG/1.5ML ~~LOC~~ SOPN: 0.5000 mg | PEN_INJECTOR | SUBCUTANEOUS | 0 refills | Status: DC

## 2021-08-20 NOTE — Progress Notes (Signed)
Subjective:    Patient ID: Ellen [name], female    DOB: 12/05/1955, 66 y.o.   MRN: [id number]  HPI 66 year old female who  has a past medical history of Alcohol abuse, Depression, Hyperlipidemia, Hypertension, Pre-diabetes, and Seasonal allergies.  She presents to the office today for follow up regarding pre diabetes and weight gain. Despite changing her lifestyle with healthy eating and going to the [facility] multiple times a week she has not been able to lose weight. She always feels hungry and is craving sweets. Her weight is up another 7 pounds over the last month   Wt Readings from Last 10 Encounters:  08/20/21 194 lb (88 kg)  07/23/21 187 lb 3.2 oz (84.9 kg)  05/14/21 180 lb (81.6 kg)  05/05/21 180 lb (81.6 kg)  02/12/21 166 lb (75.3 kg)  01/18/21 160 lb (72.6 kg)  12/18/20 150 lb (68 kg)  10/30/20 164 lb (74.4 kg)  10/21/20 162 lb 2 oz (73.5 kg)  08/21/20 169 lb (76.7 kg)   Lab Results  Component Value Date   HGBA1C 6.2 (A) 07/23/2021   Review of Systems See HPI   Past Medical History:  Diagnosis Date   Alcohol abuse    Depression    Hyperlipidemia    Hypertension    Pre-diabetes    Seasonal allergies     Social History   Socioeconomic History   Marital status: Soil scientist    Spouse name: Not on file   Number of children: Not on file   Years of education: 16   Highest education level: Associate degree: occupational, Hotel manager, or vocational program  Occupational History   Not on file  Tobacco Use   Smoking status: Former   Smokeless tobacco: Never  Scientific laboratory technician Use: Never used  Substance and Sexual Activity   Alcohol use: Not Currently   Drug use: Never   Sexual activity: Not on file  Other Topics Concern   Not on file  Social History Narrative   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    Married to partner    Right handed   Drinks caffeine   Two story home      Social Determinants of Health   Financial Resource  Strain: Medium Risk   Difficulty of Paying Living Expenses: Somewhat hard  Food Insecurity: No Food Insecurity   Worried About Charity fundraiser in the Last Year: Never true   Arboriculturist in the Last Year: Never true  Transportation Needs: No Transportation Needs   Lack of Transportation (Medical): No   Lack of Transportation (Non-Medical): No  Physical Activity: Sufficiently Active   Days of Exercise per Week: 6 days   Minutes of Exercise per Session: 60 min  Stress: Stress Concern Present   Feeling of Stress : To some extent  Social Connections: Moderately Integrated   Frequency of Communication with Friends and Family: Twice a week   Frequency of Social Gatherings with Friends and Family: More than three times a week   Attends Religious Services: Never   Marine scientist or Organizations: Yes   Attends Archivist Meetings: 1 to 4 times per year   Marital Status: Living with partner  Intimate Partner Violence: Not on file    Past Surgical History:  Procedure Laterality Date   ABDOMINAL HYSTERECTOMY  1998    Family History  Problem Relation Age of Onset   High Cholesterol Mother  High blood pressure Mother    Diabetes Mother    High blood pressure Father    High Cholesterol Father    Diabetes Father    High blood pressure Brother    High Cholesterol Brother     No Known Allergies  Current Outpatient Medications on File Prior to Visit  Medication Sig Dispense Refill   albuterol (VENTOLIN HFA) 108 (90 Base) MCG/ACT inhaler TAKE 2 PUFFS BY MOUTH EVERY 6 HOURS AS NEEDED FOR WHEEZE OR SHORTNESS OF BREATH 8.5 each 0   benztropine (COGENTIN) 1 MG tablet TAKE 1 TABLET BY MOUTH EVERY DAY 90 tablet 1   citalopram (CELEXA) 10 MG tablet TAKE 1 TABLET BY MOUTH EVERY DAY 90 tablet 1   donepezil (ARICEPT) 10 MG tablet Take 1 tablet (10 mg total) by mouth every morning. 30 tablet 11   ezetimibe (ZETIA) 10 MG tablet TAKE 1 TABLET BY MOUTH EVERY DAY 90 tablet 1    fluticasone (FLONASE) 50 MCG/ACT nasal spray Place 2 sprays into both nostrils daily. 16 g 6   hydrochlorothiazide (HYDRODIURIL) 25 MG tablet TAKE 1 TABLET (25 MG TOTAL) BY MOUTH DAILY. 90 tablet 1   Magnesium 250 MG TABS Take 250 mg by mouth daily as needed (cramps).     Multiple Vitamin (MULTIVITAMIN WITH MINERALS) TABS tablet Take 1 tablet by mouth every morning. ALIVE     naproxen sodium (ALEVE) 220 MG tablet Take 220-440 mg by mouth 2 (two) times daily as needed (pain).     ramipril (ALTACE) 5 MG capsule Take 1 capsule (5 mg total) by mouth daily. 90 capsule 1   rosuvastatin (CRESTOR) 40 MG tablet TAKE 1 TABLET BY MOUTH EVERY DAY 90 tablet 1   [DISCONTINUED] escitalopram (LEXAPRO) 10 MG tablet TAKE 1 TABLET BY MOUTH EVERY DAY 90 tablet 0   No current facility-administered medications on file prior to visit.    BP 132/82    Pulse 65    Temp 98.3 F (36.8 C) (Oral)    Ht 5\' 7"  (1.702 m)    Wt 194 lb (88 kg)    SpO2 96%    BMI 30.38 kg/m       Objective:   Physical Exam Vitals and nursing note reviewed.  Constitutional:      Appearance: Normal appearance.  Cardiovascular:     Rate and Rhythm: Normal rate and regular rhythm.     Pulses: Normal pulses.     Heart sounds: Normal heart sounds.  Pulmonary:     Effort: Pulmonary effort is normal.     Breath sounds: Normal breath sounds.  Skin:    General: Skin is warm and dry.     Capillary Refill: Capillary refill takes less than 2 seconds.  Neurological:     General: No focal deficit present.     Mental Status: She is alert and oriented to person, place, and time.  Psychiatric:        Mood and Affect: Mood normal.        Behavior: Behavior normal.        Thought Content: Thought content normal.        Judgment: Judgment normal.       Assessment & Plan:  1. Pre-diabetes - Will trial her on Ozempic  - Follow up in one month or sooner if needed - Semaglutide,0.25 or 0.5MG /DOS, (OZEMPIC, 0.25 OR 0.5 MG/DOSE,) 2 MG/1.5ML  SOPN; Inject 0.5 mg into the skin once a week.  Dispense: 1.5 mL; Refill: 0  [place]  Brin Ruggerio, NP

## 2021-08-25 NOTE — Telephone Encounter (Signed)
FYI

## 2021-09-03 ENCOUNTER — Ambulatory Visit
Admission: RE | Admit: 2021-09-03 | Discharge: 2021-09-03 | Disposition: A | Discharge status: Home / Self Care | Payer: Medicare PPO | Source: Ambulatory Visit | Attending: Adult Health | Admitting: Adult Health

## 2021-09-03 ENCOUNTER — Other Ambulatory Visit: Payer: Self-pay | Admitting: Adult Health

## 2021-09-03 DIAGNOSIS — D249 Benign neoplasm of unspecified breast: Secondary | ICD-10-CM

## 2021-09-03 DIAGNOSIS — N6012 Diffuse cystic mastopathy of left breast: Secondary | ICD-10-CM

## 2021-09-11 ENCOUNTER — Other Ambulatory Visit: Payer: Self-pay | Admitting: Adult Health

## 2021-09-11 ENCOUNTER — Encounter: Payer: Self-pay | Admitting: Neurology

## 2021-09-11 ENCOUNTER — Encounter: Payer: Self-pay | Admitting: Adult Health

## 2021-09-11 ENCOUNTER — Ambulatory Visit: Payer: Medicare PPO | Admitting: Neurology

## 2021-09-11 ENCOUNTER — Other Ambulatory Visit: Payer: Self-pay

## 2021-09-11 VITALS — BP 117/72 | HR 74 | Ht 67.0 in | Wt 188.8 lb

## 2021-09-11 DIAGNOSIS — F1021 Alcohol dependence, in remission: Secondary | ICD-10-CM

## 2021-09-11 DIAGNOSIS — R413 Other amnesia: Secondary | ICD-10-CM

## 2021-09-11 DIAGNOSIS — G3184 Mild cognitive impairment, so stated: Secondary | ICD-10-CM

## 2021-09-11 DIAGNOSIS — R7303 Prediabetes: Secondary | ICD-10-CM

## 2021-09-11 MED ORDER — DONEPEZIL HCL 10 MG PO TABS: 10.0000 mg | ORAL_TABLET | Freq: Every morning | ORAL | 3 refills | Status: DC
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
## 2021-09-11 NOTE — Patient Instructions (Signed)
Good to see you doing well!  Continue Donepezil 10mg  daily  2. Discuss with PCP if you still need Cogentin  3. Follow-up in 6 months, call for any changes    RECOMMENDATIONS FOR ALL PATIENTS WITH MEMORY PROBLEMS: 1. Continue to exercise (Recommend 30 minutes of walking everyday, or 3 hours every week) 2. Increase social interactions - continue going to [place] and enjoy social gatherings with friends and family 3. Eat healthy, avoid fried foods and eat more fruits and vegetables 4. Maintain adequate blood pressure, blood sugar, and blood cholesterol level. Reducing the risk of stroke and cardiovascular disease also helps promoting better memory. 5. Avoid stressful situations. Live a simple life and avoid aggravations. Organize your time and prepare for the next day in anticipation. 6. Sleep well, avoid any interruptions of sleep and avoid any distractions in the bedroom that may interfere with adequate sleep quality 7. Avoid sugar, avoid sweets as there is a strong link between excessive sugar intake, diabetes, and cognitive impairment The Mediterranean diet has been shown to help patients reduce the risk of progressive memory disorders and reduces cardiovascular risk. This includes eating fish, eat fruits and green leafy vegetables, nuts like almonds and hazelnuts, walnuts, and also use olive oil. Avoid fast foods and fried foods as much as possible. Avoid sweets and sugar as sugar use has been linked to worsening of memory function.

## 2021-09-12 ENCOUNTER — Other Ambulatory Visit: Payer: Self-pay | Admitting: Adult Health

## 2021-09-12 DIAGNOSIS — F32A Depression, unspecified: Secondary | ICD-10-CM

## 2021-09-12 DIAGNOSIS — R251 Tremor, unspecified: Secondary | ICD-10-CM

## 2021-09-13 ENCOUNTER — Encounter: Payer: Self-pay | Admitting: Adult Health

## 2021-09-14 ENCOUNTER — Other Ambulatory Visit: Payer: Self-pay | Admitting: Adult Health

## 2021-09-14 DIAGNOSIS — R7303 Prediabetes: Secondary | ICD-10-CM

## 2021-09-14 MED ORDER — OZEMPIC (0.25 OR 0.5 MG/DOSE) 2 MG/1.5ML ~~LOC~~ SOPN: 0.5000 mg | PEN_INJECTOR | SUBCUTANEOUS | 0 refills | Status: DC

## 2021-09-22 ENCOUNTER — Other Ambulatory Visit: Payer: Self-pay | Admitting: Adult Health

## 2021-09-22 MED ORDER — SEMAGLUTIDE (1 MG/DOSE) 4 MG/3ML ~~LOC~~ SOPN: 1.0000 mg | PEN_INJECTOR | SUBCUTANEOUS | 2 refills | Status: DC

## 2021-10-08 ENCOUNTER — Encounter: Payer: Self-pay | Admitting: Adult Health

## 2021-10-11 ENCOUNTER — Other Ambulatory Visit: Payer: Self-pay | Admitting: Adult Health

## 2021-10-11 DIAGNOSIS — H6981 Other specified disorders of Eustachian tube, right ear: Secondary | ICD-10-CM

## 2021-10-14 ENCOUNTER — Encounter: Payer: Self-pay | Admitting: Adult Health

## 2021-10-14 ENCOUNTER — Telehealth: Payer: Medicare PPO | Admitting: Adult Health

## 2021-10-14 VITALS — Ht 67.0 in | Wt 179.0 lb

## 2021-10-14 DIAGNOSIS — J988 Other specified respiratory disorders: Secondary | ICD-10-CM

## 2021-10-14 MED ORDER — BENZONATATE 200 MG PO CAPS: 200.0000 mg | ORAL_CAPSULE | Freq: Three times a day (TID) | ORAL | 1 refills | Status: DC | PRN

## 2021-10-14 MED ORDER — PREDNISONE 10 MG PO TABS: ORAL_TABLET | ORAL | 0 refills | Status: DC

## 2021-10-14 NOTE — Progress Notes (Signed)
Virtual Visit via Telephone Note ? ?I connected with Ellen [name] on 10/14/21 at 11:45 AM EST by telephone and verified that I am speaking with the correct person using two identifiers. ?  ?I discussed the limitations, risks, security and privacy concerns of performing an evaluation and management service by telephone and the availability of in person appointments. I also discussed with the patient that there may be a patient responsible charge related to this service. The patient expressed understanding and agreed to proceed. ? ?Location patient: home ?Location provider: work or home office ?Participants present for the call: patient, provider ?Patient did not have a visit in the prior 7 days to address this/these issue(s). ? ? ?History of Present Illness: ?66 year old female who  has a past medical history of Alcohol abuse, Depression, Hyperlipidemia, Hypertension, Pre-diabetes, and Seasonal allergies. ? ?She is being evaluated today for an acute issue.  Her symptoms have been present for roughly 1 week.  Symptoms include fatigue, chills, dry cough, chest congestion and mild wheezing.  Denies shortness of breath, fevers, nausea, vomiting, or diarrhea.  She did take a COVID test which was negative. ?  ?Observations/Objective: ?Patient sounds cheerful and well on the phone. ?I do not appreciate any SOB + wheezing and dry cough.  ?Speech and thought processing are grossly intact. ?Patient reported vitals: ? ?Assessment and Plan: ?1. Respiratory infection ? ?- predniSONE (DELTASONE) 10 MG tablet; 40 mg x 3 days, 20 mg x 3 days, 10 mg x 3 days  Dispense: 21 tablet; Refill: 0 ?- benzonatate (TESSALON) 200 MG capsule; Take 1 capsule (200 mg total) by mouth 3 (three) times daily as needed for cough.  Dispense: 30 capsule; Refill: 1 ? ? ?Follow Up Instructions: ? ?99441 5-10 ?99442 11-20 ?9443 21-30 ?I did not refer this patient for an OV in the next 24 hours for this/these issue(s). ? ?I discussed the assessment  and treatment plan with the patient. The patient was provided an opportunity to ask questions and all were answered. The patient agreed with the plan and demonstrated an understanding of the instructions. ?  ?The patient was advised to call back or seek an in-person evaluation if the symptoms worsen or if the condition fails to improve as anticipated. ? ?I provided 12 minutes of non-face-to-face time during this encounter. ? ? ?Dorothyann Peng, NP  ? ?

## 2021-10-14 NOTE — Telephone Encounter (Signed)
Pt has been scheduled and will take a Covid test.  ?

## 2021-11-26 ENCOUNTER — Other Ambulatory Visit: Payer: Self-pay | Admitting: Adult Health

## 2021-12-02 ENCOUNTER — Ambulatory Visit: Payer: Medicare PPO | Admitting: Adult Health

## 2021-12-02 ENCOUNTER — Encounter: Payer: Self-pay | Admitting: Adult Health

## 2021-12-02 VITALS — BP 122/78 | HR 64 | Temp 98.1°F | Ht 67.0 in | Wt 178.0 lb

## 2021-12-02 DIAGNOSIS — M25561 Pain in right knee: Secondary | ICD-10-CM

## 2021-12-02 DIAGNOSIS — G8929 Other chronic pain: Secondary | ICD-10-CM | POA: Diagnosis not present

## 2021-12-02 MED ORDER — METHYLPREDNISOLONE ACETATE 80 MG/ML IJ SUSP
80.0000 mg | Freq: Once | INTRAMUSCULAR | Status: AC
  Administered 2021-12-02: 80 mg via INTRA_ARTICULAR

## 2021-12-02 NOTE — Progress Notes (Signed)
? ?Subjective:  ? ? Patient ID: Ellen [name], female    DOB: 1955/11/14, 66 y.o.   MRN: [id number] ? ?HPI ? ?66 year old female who  has a past medical history of Alcohol abuse, Depression, Hyperlipidemia, Hypertension, Pre-diabetes, and Seasonal allergies. ? ?She presents to the office today for chronic right knee pain. She was last seen for this issue back in September 2022 at which time she opted for a steroid injection. She reports that she had significant improvement for the last 5-6 months. Over the last month her pain has become worse. Pain is described as an aching pain. Discomfort is worse with ambulation and working out.  ? ?Review of Systems ?See HPI  ? ?Past Medical History:  ?Diagnosis Date  ? Alcohol abuse   ? Depression   ? Hyperlipidemia   ? Hypertension   ? Pre-diabetes   ? Seasonal allergies   ? ? ?Social History  ? ?Socioeconomic History  ? Marital status: Soil scientist  ?  Spouse name: Not on file  ? Number of children: Not on file  ? Years of education: 43  ? Highest education level: Associate degree: occupational, Hotel manager, or vocational program  ?Occupational History  ? Not on file  ?Tobacco Use  ? Smoking status: Former  ? Smokeless tobacco: Never  ?Vaping Use  ? Vaping Use: Never used  ?Substance and Sexual Activity  ? Alcohol use: Not Currently  ? Drug use: Never  ? Sexual activity: Not on file  ?Other Topics Concern  ? Not on file  ?Social History Narrative  ? 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 Married to partner   ? Right handed  ? Drinks caffeine  ? Two story home  ?   ? ?Social Determinants of Health  ? ?Financial Resource Strain: Medium Risk  ? Difficulty of Paying Living Expenses: Somewhat hard  ?Food Insecurity: No Food Insecurity  ? Worried About Charity fundraiser in the Last Year: Never true  ? Ran Out of Food in the Last Year: Never true  ?Transportation Needs: No Transportation Needs  ? Lack of Transportation (Medical): No  ? Lack of  Transportation (Non-Medical): No  ?Physical Activity: Sufficiently Active  ? Days of Exercise per Week: 6 days  ? Minutes of Exercise per Session: 60 min  ?Stress: Stress Concern Present  ? Feeling of Stress : To some extent  ?Social Connections: Moderately Integrated  ? Frequency of Communication with Friends and Family: Twice a week  ? Frequency of Social Gatherings with Friends and Family: More than three times a week  ? Attends Religious Services: Never  ? Active Member of Clubs or Organizations: Yes  ? Attends Archivist Meetings: 1 to 4 times per year  ? Marital Status: Living with partner  ?Intimate Partner Violence: Not on file  ? ? ?Past Surgical History:  ?Procedure Laterality Date  ? ABDOMINAL HYSTERECTOMY  1998  ? ? ?Family History  ?Problem Relation Age of Onset  ? High Cholesterol Mother   ? High blood pressure Mother   ? Diabetes Mother   ? High blood pressure Father   ? High Cholesterol Father   ? Diabetes Father   ? High blood pressure Brother   ? High Cholesterol Brother   ? ? ?No Known Allergies ? ?Current Outpatient Medications on File Prior to Visit  ?Medication Sig Dispense Refill  ? albuterol (VENTOLIN HFA) 108 (90 Base) MCG/ACT inhaler TAKE 2  PUFFS BY MOUTH EVERY 6 HOURS AS NEEDED FOR WHEEZE OR SHORTNESS OF BREATH 8.5 each 0  ? benzonatate (TESSALON) 200 MG capsule Take 1 capsule (200 mg total) by mouth 3 (three) times daily as needed for cough. 30 capsule 1  ? benztropine (COGENTIN) 1 MG tablet TAKE 1 TABLET BY MOUTH EVERY DAY 90 tablet 1  ? citalopram (CELEXA) 10 MG tablet TAKE 1 TABLET BY MOUTH EVERY DAY 90 tablet 1  ? donepezil (ARICEPT) 10 MG tablet Take 1 tablet (10 mg total) by mouth every morning. 90 tablet 3  ? ezetimibe (ZETIA) 10 MG tablet TAKE 1 TABLET BY MOUTH EVERY DAY 90 tablet 1  ? fluticasone (FLONASE) 50 MCG/ACT nasal spray SPRAY 2 SPRAYS INTO EACH NOSTRIL EVERY DAY 48 mL 2  ? hydrochlorothiazide (HYDRODIURIL) 25 MG tablet TAKE 1 TABLET (25 MG TOTAL) BY MOUTH  DAILY. 90 tablet 1  ? Magnesium 250 MG TABS Take 250 mg by mouth daily as needed (cramps).    ? Multiple Vitamin (MULTIVITAMIN WITH MINERALS) TABS tablet Take 1 tablet by mouth every morning. ALIVE    ? naproxen sodium (ALEVE) 220 MG tablet Take 220-440 mg by mouth 2 (two) times daily as needed (pain).    ? OZEMPIC, 1 MG/DOSE, 4 MG/3ML SOPN INJECT 1 MG ONCE A WEEK AS DIRECTED 3 mL 2  ? predniSONE (DELTASONE) 10 MG tablet 40 mg x 3 days, 20 mg x 3 days, 10 mg x 3 days 21 tablet 0  ? ramipril (ALTACE) 5 MG capsule Take 1 capsule (5 mg total) by mouth daily. 90 capsule 1  ? rosuvastatin (CRESTOR) 40 MG tablet TAKE 1 TABLET BY MOUTH EVERY DAY 90 tablet 1  ? [DISCONTINUED] escitalopram (LEXAPRO) 10 MG tablet TAKE 1 TABLET BY MOUTH EVERY DAY 90 tablet 0  ? ?No current facility-administered medications on file prior to visit.  ? ? ?BP 122/78   Pulse 64   Temp 98.1 ?F (36.7 ?C) (Oral)   Ht '5\' 7"'$  (1.702 m)   Wt 178 lb (80.7 kg)   SpO2 98%   BMI 27.88 kg/m?  ? ? ?   ?Objective:  ? Physical Exam ?Vitals and nursing note reviewed.  ?Constitutional:   ?   Appearance: Normal appearance.  ?Musculoskeletal:     ?   General: Tenderness present. Normal range of motion.  ?Skin: ?   General: Skin is warm and dry.  ?   Capillary Refill: Capillary refill takes less than 2 seconds.  ?Neurological:  ?   General: No focal deficit present.  ?   Mental Status: She is alert and oriented to person, place, and time.  ? ? ?   ?Assessment & Plan:  ?1. Chronic pain of right knee ?Discussed risks and benefits of corticosteroid injection and patient consented.  After prepping skin with betadine, injected 80 mg depomedrol and 2 cc of plain xylocaine with 22 gauge one and one half inch needle using anterolateral approach and pt tolerated well. ? ?- methylPREDNISolone acetate (DEPO-MEDROL) injection 80 mg ? ?Follow up as needed ? ?Dorothyann Peng, NP ? ?

## 2021-12-07 ENCOUNTER — Telehealth: Payer: Self-pay | Admitting: Adult Health

## 2021-12-07 NOTE — Telephone Encounter (Signed)
Left message for patient to call back and schedule Medicare Annual Wellness Visit (AWV) .  ? ?Please offer to do virtually or by telephone.  Left office number. ? ?AWVI eligible as of 11/14/21 ? ?Please schedule at anytime with Nurse Health Advisor. ?  ?

## 2021-12-14 NOTE — Telephone Encounter (Signed)
Returned patient calls ? ?Patient returned carries call ?

## 2021-12-16 ENCOUNTER — Ambulatory Visit (INDEPENDENT_AMBULATORY_CARE_PROVIDER_SITE_OTHER): Payer: Medicare PPO

## 2021-12-16 VITALS — BP 120/62 | HR 64 | Temp 97.8°F | Ht 67.0 in | Wt 174.5 lb

## 2021-12-16 DIAGNOSIS — Z0000 Encounter for general adult medical examination without abnormal findings: Secondary | ICD-10-CM | POA: Diagnosis not present

## 2021-12-16 DIAGNOSIS — H524 Presbyopia: Secondary | ICD-10-CM | POA: Diagnosis not present

## 2021-12-16 DIAGNOSIS — Z13820 Encounter for screening for osteoporosis: Secondary | ICD-10-CM | POA: Diagnosis not present

## 2021-12-16 DIAGNOSIS — H2513 Age-related nuclear cataract, bilateral: Secondary | ICD-10-CM | POA: Diagnosis not present

## 2021-12-16 DIAGNOSIS — H52223 Regular astigmatism, bilateral: Secondary | ICD-10-CM | POA: Diagnosis not present

## 2021-12-16 DIAGNOSIS — H35373 Puckering of macula, bilateral: Secondary | ICD-10-CM | POA: Diagnosis not present

## 2021-12-16 NOTE — Addendum Note (Signed)
Addended by: Criselda Peaches on: 12/16/2021 11:31 AM ? ? Modules accepted: Orders ? ?

## 2021-12-16 NOTE — Progress Notes (Signed)
? ?Subjective:  ? Ellen [name] is a 66 y.o. female who presents for Medicare Annual (Subsequent) preventive examination. ? ?Review of Systems    ? ? ?   ?Objective:  ?  ?Today's Vitals  ? 12/16/21 1042  ?BP: 120/62  ?Pulse: 64  ?Temp: 97.8 ?F (36.6 ?C)  ?TempSrc: Oral  ?SpO2: 99%  ?Weight: 174 lb 8 oz (79.2 kg)  ?Height: '5\' 7"'$  (1.702 m)  ? ?Body mass index is 27.33 kg/m?. ? ? ?  12/16/2021  ? 10:56 AM 09/11/2021  ?  8:16 AM 02/12/2021  ? 10:26 AM 01/18/2021  ?  5:44 PM 10/30/2020  ? 10:22 AM  ?Advanced Directives  ?Does Patient Have a Medical Advance Directive? Yes Yes Yes No No  ?Type of Paramedic of [place];Living will [place];Living will;Out of facility DNR (pink MOST or yellow form)     ?Does patient want to make changes to medical advance directive? No - Patient declined      ?Copy of [place] in Chart? No - copy requested      ?Would patient like information on creating a medical advance directive?    No - Patient declined   ? ? ?Current Medications (verified) ?Outpatient Encounter Medications as of 12/16/2021  ?Medication Sig  ? albuterol (VENTOLIN HFA) 108 (90 Base) MCG/ACT inhaler TAKE 2 PUFFS BY MOUTH EVERY 6 HOURS AS NEEDED FOR WHEEZE OR SHORTNESS OF BREATH  ? benzonatate (TESSALON) 200 MG capsule Take 1 capsule (200 mg total) by mouth 3 (three) times daily as needed for cough.  ? benztropine (COGENTIN) 1 MG tablet TAKE 1 TABLET BY MOUTH EVERY DAY  ? citalopram (CELEXA) 10 MG tablet TAKE 1 TABLET BY MOUTH EVERY DAY  ? donepezil (ARICEPT) 10 MG tablet Take 1 tablet (10 mg total) by mouth every morning.  ? ezetimibe (ZETIA) 10 MG tablet TAKE 1 TABLET BY MOUTH EVERY DAY  ? fluticasone (FLONASE) 50 MCG/ACT nasal spray SPRAY 2 SPRAYS INTO EACH NOSTRIL EVERY DAY  ? hydrochlorothiazide (HYDRODIURIL) 25 MG tablet TAKE 1 TABLET (25 MG TOTAL) BY MOUTH DAILY.  ? Magnesium 250 MG TABS Take 250 mg by mouth daily as needed (cramps).  ? Multiple Vitamin  (MULTIVITAMIN WITH MINERALS) TABS tablet Take 1 tablet by mouth every morning. ALIVE  ? naproxen sodium (ALEVE) 220 MG tablet Take 220-440 mg by mouth 2 (two) times daily as needed (pain).  ? OZEMPIC, 1 MG/DOSE, 4 MG/3ML SOPN INJECT 1 MG ONCE A WEEK AS DIRECTED  ? predniSONE (DELTASONE) 10 MG tablet 40 mg x 3 days, 20 mg x 3 days, 10 mg x 3 days  ? ramipril (ALTACE) 5 MG capsule Take 1 capsule (5 mg total) by mouth daily.  ? rosuvastatin (CRESTOR) 40 MG tablet TAKE 1 TABLET BY MOUTH EVERY DAY  ? [DISCONTINUED] escitalopram (LEXAPRO) 10 MG tablet TAKE 1 TABLET BY MOUTH EVERY DAY  ? ?No facility-administered encounter medications on file as of 12/16/2021.  ? ? ?Allergies (verified) ?Patient has no known allergies.  ? ?History: ?Past Medical History:  ?Diagnosis Date  ? Alcohol abuse   ? Depression   ? Hyperlipidemia   ? Hypertension   ? Pre-diabetes   ? Seasonal allergies   ? ?Past Surgical History:  ?Procedure Laterality Date  ? ABDOMINAL HYSTERECTOMY  1998  ? ?Family History  ?Problem Relation Age of Onset  ? High Cholesterol Mother   ? High blood pressure Mother   ? Diabetes Mother   ?  High blood pressure Father   ? High Cholesterol Father   ? Diabetes Father   ? High blood pressure Brother   ? High Cholesterol Brother   ? ?Social History  ? ?Socioeconomic History  ? Marital status: Soil scientist  ?  Spouse name: Not on file  ? Number of children: Not on file  ? Years of education: 73  ? Highest education level: Associate degree: occupational, Hotel manager, or vocational program  ?Occupational History  ? Not on file  ?Tobacco Use  ? Smoking status: Former  ? Smokeless tobacco: Never  ?Vaping Use  ? Vaping Use: Never used  ?Substance and Sexual Activity  ? Alcohol use: Not Currently  ? Drug use: Never  ? Sexual activity: Not on file  ?Other Topics Concern  ? Not on file  ?Social History Narrative  ? 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 Married to partner   ? Right handed  ? Drinks caffeine  ? Two  story home  ?   ? ?Social Determinants of Health  ? ?Financial Resource Strain: Low Risk   ? Difficulty of Paying Living Expenses: Not very hard  ?Food Insecurity: No Food Insecurity  ? Worried About Charity fundraiser in the Last Year: Never true  ? Ran Out of Food in the Last Year: Never true  ?Transportation Needs: No Transportation Needs  ? Lack of Transportation (Medical): No  ? Lack of Transportation (Non-Medical): No  ?Physical Activity: Sufficiently Active  ? Days of Exercise per Week: 5 days  ? Minutes of Exercise per Session: 60 min  ?Stress: No Stress Concern Present  ? Feeling of Stress : Only a little  ?Social Connections: Socially Integrated  ? Frequency of Communication with Friends and Family: Three times a week  ? Frequency of Social Gatherings with Friends and Family: Three times a week  ? Attends Religious Services: More than 4 times per year  ? Active Member of Clubs or Organizations: Yes  ? Attends Archivist Meetings: 1 to 4 times per year  ? Marital Status: Living with partner  ? ? ? ?Clinical Intake: ? ?Pre-visit preparation completed: Yes ? ?Pain : No/denies pain ? ?  ? ?BMI - recorded: 27.87 ?Nutritional Status: BMI 25 -29 Overweight ?Nutritional Risks: None ?Diabetes: No ? ?How often do you need to have someone help you when you read instructions, pamphlets, or other written materials from your doctor or pharmacy?: 1 - Never ? ?Diabetic?  No ? ?Interpreter Needed?: No ?Activities of Daily Living ? ?  12/16/2021  ? 10:51 AM 12/15/2021  ?  4:21 PM  ?In your present state of health, do you have any difficulty performing the following activities:  ?Hearing? 0 0  ?Vision? 0 0  ?Difficulty concentrating or making decisions? 1 1  ?Comment Followed by Dr Delice Lesch   ?Walking or climbing stairs? 0 0  ?Dressing or bathing? 0 0  ?Doing errands, shopping? 0 0  ?Preparing Food and eating ? N N  ?Using the Toilet? N N  ?In the past six months, have you accidently leaked urine? N N  ?Do you have  problems with loss of bowel control? N N  ?Managing your Medications? N   ?Managing your Finances? N N  ?Housekeeping or managing your Housekeeping? N N  ? ? ?Patient Care Team: ?Dorothyann Peng, NP as PCP - General (Family Medicine) ?Cameron Sprang, MD as Consulting Physician (Neurology) ? ?Indicate any recent Medical  Services you may have received from other than [facility] providers in the past year (date may be approximate). ? ?   ?Assessment:  ? This is a routine wellness examination for [facility]. ? ?Hearing/Vision screen ?Hearing Screening - Comments:: No hearing difficulty ?Vision Screening - Comments:: Wears reading glasses. Followed by [facility] ? ?Dietary issues and exercise activities discussed: ?Exercise limited by: None identified ? ? Goals Addressed   ? ?  ?  ?  ?  ?  ? This Visit's Progress  ?   Maintain good health (pt-stated)     ?   Both mentally and physical. ?  ? ?  ? ?Depression Screen ? ?  12/16/2021  ? 10:48 AM 01/28/2021  ? 10:57 AM 05/21/2020  ? 10:00 AM  ?PHQ 2/9 Scores  ?PHQ - 2 Score 0  0  ?PHQ- 9 Score   0  ?  ? Information is confidential and restricted. Go to Review Flowsheets to unlock data.  ?  ?Fall Risk ? ?  12/16/2021  ? 10:53 AM 12/15/2021  ?  4:21 PM 09/11/2021  ?  8:16 AM 07/22/2021  ?  3:48 PM 02/12/2021  ? 10:25 AM  ?Fall Risk   ?Falls in the past year? 0 0 0 0 0  ?Number falls in past yr: 0 0 0  0  ?Injury with Fall? 0 0 0  0  ?Risk for fall due to : No Fall Risks      ? ? ?FALL RISK PREVENTION PERTAINING TO THE HOME: ? ?Any stairs in or around the home? Yes  ?If so, are there any without handrails? No  ?Home free of loose throw rugs in walkways, pet beds, electrical cords, etc? Yes  ?Adequate lighting in your home to reduce risk of falls? Yes  ? ?ASSISTIVE DEVICES UTILIZED TO PREVENT FALLS: ? ?Life alert? No  ?Use of a cane, walker or w/c? No  ?Grab bars in the bathroom? Yes  ?Shower chair or bench in shower? No  ?Elevated toilet seat or a handicapped toilet? No  ? ?TIMED UP AND  GO: ? ?Was the test performed? Yes .  ?Length of time to ambulate 10 feet: 5 sec.  ? ?Gait steady and fast without use of assistive device ? ?Cognitive Function: ?  ? ?  02/12/2021  ? 10:00 AM 10/30/2020  ? 10:00 AM  ?

## 2021-12-16 NOTE — Patient Instructions (Addendum)
?[name] , ?Thank you for taking time to come for your Medicare Wellness Visit. I appreciate your ongoing commitment to your health goals. Please review the following plan we discussed and let me know if I can assist you in the future.  ? ?These are the goals we discussed: ? Goals   ? ?   Maintain good health (pt-stated)   ?   Both mentally and physical. ?  ? ?  ?  ?This is a list of the screening recommended for you and due dates:  ?Health Maintenance  ?Topic Date Due  ? Hepatitis C Screening: USPSTF Recommendation to screen - Ages 43-79 yo.  Never done  ? Zoster (Shingles) Vaccine (1 of 2) 03/18/2022*  ? Pneumonia Vaccine (1 - PCV) 12/17/2022*  ? DEXA scan (bone density measurement)  12/17/2022*  ? Flu Shot  03/16/2022  ? Colon Cancer Screening  08/16/2022  ? Mammogram  09/04/2023  ? Tetanus Vaccine  08/16/2026  ? COVID-19 Vaccine  Completed  ? HPV Vaccine  Aged Out  ?*Topic was postponed. The date shown is not the original due date.  ? ?Advanced directives: Yes Copies on file ? ?Conditions/risks identified: None ? ?Next appointment: Follow up in one year for your annual wellness visit  ? ? ? ?Preventive Care 43 Years and Older, Female ?Preventive care refers to lifestyle choices and visits with your health care provider that can promote health and wellness. ?What does preventive care include? ?A yearly physical exam. This is also called an annual well check. ?Dental exams once or twice a year. ?Routine eye exams. Ask your health care provider how often you should have your eyes checked. ?Personal lifestyle choices, including: ?Daily care of your teeth and gums. ?Regular physical activity. ?Eating a healthy diet. ?Avoiding tobacco and drug use. ?Limiting alcohol use. ?Practicing safe sex. ?Taking low-dose aspirin every day. ?Taking vitamin and mineral supplements as recommended by your health care provider. ?What happens during an annual well check? ?The services and screenings done by your health care provider  during your annual well check will depend on your age, overall health, lifestyle risk factors, and family history of disease. ?Counseling  ?Your health care provider may ask you questions about your: ?Alcohol use. ?Tobacco use. ?Drug use. ?Emotional well-being. ?Home and relationship well-being. ?Sexual activity. ?Eating habits. ?History of falls. ?Memory and ability to understand (cognition). ?Work and work Statistician. ?Reproductive health. ?Screening  ?You may have the following tests or measurements: ?Height, weight, and BMI. ?Blood pressure. ?Lipid and cholesterol levels. These may be checked every 5 years, or more frequently if you are over 39 years old. ?Skin check. ?Lung cancer screening. You may have this screening every year starting at age 62 if you have a 30-pack-year history of smoking and currently smoke or have quit within the past 15 years. ?Fecal occult blood test (FOBT) of the stool. You may have this test every year starting at age 10. ?Flexible sigmoidoscopy or colonoscopy. You may have a sigmoidoscopy every 5 years or a colonoscopy every 10 years starting at age 52. ?Hepatitis C blood test. ?Hepatitis B blood test. ?Sexually transmitted disease (STD) testing. ?Diabetes screening. This is done by checking your blood sugar (glucose) after you have not eaten for a while (fasting). You may have this done every 1-3 years. ?Bone density scan. This is done to screen for osteoporosis. You may have this done starting at age 6. ?Mammogram. This may be done every 1-2 years. Talk to your health care provider  about how often you should have regular mammograms. ?Talk with your health care provider about your test results, treatment options, and if necessary, the need for more tests. ?Vaccines  ?Your health care provider may recommend certain vaccines, such as: ?Influenza vaccine. This is recommended every year. ?Tetanus, diphtheria, and acellular pertussis (Tdap, Td) vaccine. You may need a Td booster every  10 years. ?Zoster vaccine. You may need this after age 6. ?Pneumococcal 13-valent conjugate (PCV13) vaccine. One dose is recommended after age 66. ?Pneumococcal polysaccharide (PPSV23) vaccine. One dose is recommended after age 31. ?Talk to your health care provider about which screenings and vaccines you need and how often you need them. ?This information is not intended to replace advice given to you by your health care provider. Make sure you discuss any questions you have with your health care provider. ?Document Released: 08/29/2015 Document Revised: 04/21/2016 Document Reviewed: 06/03/2015 ?Elsevier Interactive Patient Education ? 2017 [place]. ? ?Fall Prevention in the Home ?Falls can cause injuries. They can happen to people of all ages. There are many things you can do to make your home safe and to help prevent falls. ?What can I do on the outside of my home? ?Regularly fix the edges of walkways and driveways and fix any cracks. ?Remove anything that might make you trip as you walk through a door, such as a raised step or threshold. ?Trim any bushes or trees on the path to your home. ?Use bright outdoor lighting. ?Clear any walking paths of anything that might make someone trip, such as rocks or tools. ?Regularly check to see if handrails are loose or broken. Make sure that both sides of any steps have handrails. ?Any raised decks and porches should have guardrails on the edges. ?Have any leaves, snow, or ice cleared regularly. ?Use sand or salt on walking paths during winter. ?Clean up any spills in your garage right away. This includes oil or grease spills. ?What can I do in the bathroom? ?Use night lights. ?Install grab bars by the toilet and in the tub and shower. Do not use towel bars as grab bars. ?Use non-skid mats or decals in the tub or shower. ?If you need to sit down in the shower, use a plastic, non-slip stool. ?Keep the floor dry. Clean up any water that spills on the floor as soon as it  happens. ?Remove soap buildup in the tub or shower regularly. ?Attach bath mats securely with double-sided non-slip rug tape. ?Do not have throw rugs and other things on the floor that can make you trip. ?What can I do in the bedroom? ?Use night lights. ?Make sure that you have a light by your bed that is easy to reach. ?Do not use any sheets or blankets that are too big for your bed. They should not hang down onto the floor. ?Have a firm chair that has side arms. You can use this for support while you get dressed. ?Do not have throw rugs and other things on the floor that can make you trip. ?What can I do in the kitchen? ?Clean up any spills right away. ?Avoid walking on wet floors. ?Keep items that you use a lot in easy-to-reach places. ?If you need to reach something above you, use a strong step stool that has a grab bar. ?Keep electrical cords out of the way. ?Do not use floor polish or wax that makes floors slippery. If you must use wax, use non-skid floor wax. ?Do not have throw rugs and  other things on the floor that can make you trip. ?What can I do with my stairs? ?Do not leave any items on the stairs. ?Make sure that there are handrails on both sides of the stairs and use them. Fix handrails that are broken or loose. Make sure that handrails are as long as the stairways. ?Check any carpeting to make sure that it is firmly attached to the stairs. Fix any carpet that is loose or worn. ?Avoid having throw rugs at the top or bottom of the stairs. If you do have throw rugs, attach them to the floor with carpet tape. ?Make sure that you have a light switch at the top of the stairs and the bottom of the stairs. If you do not have them, ask someone to add them for you. ?What else can I do to help prevent falls? ?Wear shoes that: ?Do not have high heels. ?Have rubber bottoms. ?Are comfortable and fit you well. ?Are closed at the toe. Do not wear sandals. ?If you use a stepladder: ?Make sure that it is fully opened.  Do not climb a closed stepladder. ?Make sure that both sides of the stepladder are locked into place. ?Ask someone to hold it for you, if possible. ?Clearly mark and make sure that you can see: ?Any

## 2022-01-04 ENCOUNTER — Encounter: Payer: Self-pay | Admitting: Adult Health

## 2022-01-05 NOTE — Telephone Encounter (Signed)
Please advise 

## 2022-01-12 ENCOUNTER — Encounter: Payer: Self-pay | Admitting: Adult Health

## 2022-01-12 ENCOUNTER — Ambulatory Visit (INDEPENDENT_AMBULATORY_CARE_PROVIDER_SITE_OTHER): Payer: Medicare PPO | Admitting: Adult Health

## 2022-01-12 VITALS — BP 100/60 | HR 67 | Temp 98.5°F | Ht 67.0 in | Wt 174.0 lb

## 2022-01-12 DIAGNOSIS — R7303 Prediabetes: Secondary | ICD-10-CM

## 2022-01-12 DIAGNOSIS — M25561 Pain in right knee: Secondary | ICD-10-CM

## 2022-01-12 MED ORDER — OZEMPIC (2 MG/DOSE) 8 MG/3ML ~~LOC~~ SOPN: 2.0000 mg | PEN_INJECTOR | SUBCUTANEOUS | 2 refills | Status: AC

## 2022-01-12 NOTE — Progress Notes (Signed)
Subjective:    Patient ID: Ellen [name], female    DOB: 1956-07-14, 66 y.o.   MRN: [id number]  HPI 66 year old female who  has a past medical history of Alcohol abuse, Depression, Hyperlipidemia, Hypertension, Pre-diabetes, and Seasonal allergies.  She presents to the office today for follow up regarding chronic right knee pain. She originally had a steroid injection into the right knee back in September 2022 and responded well to this until about a month ago when she started to have pain again. She opted for another steroid injection at this time. Most recently she was playing pickleball yesterday and injured her knee on the opposite of where her chronic pain is. She has been wearing a brace and reports improvement in the pain with that. Does not have any issues with ambulation.   Pre diabetes/weight loss management - she is currently managed with ozempic 1 mg. She has been tolerating this medication well. She continues to go to the gym on a routine basis and eats a healthy diet. Since starting ozempic her weight has come down roughly 20 pounds. She would like to increase her dose to 2 mg.   Wt Readings from Last 10 Encounters:  01/12/22 174 lb (78.9 kg)  12/16/21 174 lb 8 oz (79.2 kg)  12/02/21 178 lb (80.7 kg)  10/14/21 179 lb (81.2 kg)  09/11/21 188 lb 12.8 oz (85.6 kg)  08/20/21 194 lb (88 kg)  07/23/21 187 lb 3.2 oz (84.9 kg)  05/14/21 180 lb (81.6 kg)  05/05/21 180 lb (81.6 kg)  02/12/21 166 lb (75.3 kg)   Review of Systems See HPI   Past Medical History:  Diagnosis Date   Alcohol abuse    Depression    Hyperlipidemia    Hypertension    Pre-diabetes    Seasonal allergies     Social History   Socioeconomic History   Marital status: Soil scientist    Spouse name: Not on file   Number of children: Not on file   Years of education: 16   Highest education level: Associate degree: occupational, Hotel manager, or vocational program  Occupational History   Not on  file  Tobacco Use   Smoking status: Former   Smokeless tobacco: Never  Scientific laboratory technician Use: Never used  Substance and Sexual Activity   Alcohol use: Not Currently   Drug use: Never   Sexual activity: Not on file  Other Topics Concern   Not on file  Social History Narrative   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    Married to partner    Right handed   Drinks caffeine   Two story home      Social Determinants of Health   Financial Resource Strain: Low Risk    Difficulty of Paying Living Expenses: Not very hard  Food Insecurity: No Food Insecurity   Worried About Charity fundraiser in the Last Year: Never true   Arboriculturist in the Last Year: Never true  Transportation Needs: No Transportation Needs   Lack of Transportation (Medical): No   Lack of Transportation (Non-Medical): No  Physical Activity: Sufficiently Active   Days of Exercise per Week: 5 days   Minutes of Exercise per Session: 60 min  Stress: No Stress Concern Present   Feeling of Stress : Only a little  Social Connections: Engineer, building services of Communication with Friends and Family: Three times a week  Frequency of Social Gatherings with Friends and Family: Three times a week   Attends Religious Services: More than 4 times per year   Active Member of Clubs or Organizations: Yes   Attends Archivist Meetings: 1 to 4 times per year   Marital Status: Living with partner  Intimate Partner Violence: Not At Risk   Fear of Current or Ex-Partner: No   Emotionally Abused: No   Physically Abused: No   Sexually Abused: No    Past Surgical History:  Procedure Laterality Date   ABDOMINAL HYSTERECTOMY  1998    Family History  Problem Relation Age of Onset   High Cholesterol Mother    High blood pressure Mother    Diabetes Mother    High blood pressure Father    High Cholesterol Father    Diabetes Father    High blood pressure Brother    High Cholesterol Brother      No Known Allergies  Current Outpatient Medications on File Prior to Visit  Medication Sig Dispense Refill   albuterol (VENTOLIN HFA) 108 (90 Base) MCG/ACT inhaler TAKE 2 PUFFS BY MOUTH EVERY 6 HOURS AS NEEDED FOR WHEEZE OR SHORTNESS OF BREATH 8.5 each 0   benzonatate (TESSALON) 200 MG capsule Take 1 capsule (200 mg total) by mouth 3 (three) times daily as needed for cough. 30 capsule 1   benztropine (COGENTIN) 1 MG tablet TAKE 1 TABLET BY MOUTH EVERY DAY 90 tablet 1   citalopram (CELEXA) 10 MG tablet TAKE 1 TABLET BY MOUTH EVERY DAY 90 tablet 1   donepezil (ARICEPT) 10 MG tablet Take 1 tablet (10 mg total) by mouth every morning. 90 tablet 3   ezetimibe (ZETIA) 10 MG tablet TAKE 1 TABLET BY MOUTH EVERY DAY 90 tablet 1   fluticasone (FLONASE) 50 MCG/ACT nasal spray SPRAY 2 SPRAYS INTO EACH NOSTRIL EVERY DAY 48 mL 2   hydrochlorothiazide (HYDRODIURIL) 25 MG tablet TAKE 1 TABLET (25 MG TOTAL) BY MOUTH DAILY. 90 tablet 1   Magnesium 250 MG TABS Take 250 mg by mouth daily as needed (cramps).     Multiple Vitamin (MULTIVITAMIN WITH MINERALS) TABS tablet Take 1 tablet by mouth every morning. ALIVE     naproxen sodium (ALEVE) 220 MG tablet Take 220-440 mg by mouth 2 (two) times daily as needed (pain).     OZEMPIC, 1 MG/DOSE, 4 MG/3ML SOPN INJECT 1 MG ONCE A WEEK AS DIRECTED 3 mL 2   predniSONE (DELTASONE) 10 MG tablet 40 mg x 3 days, 20 mg x 3 days, 10 mg x 3 days 21 tablet 0   ramipril (ALTACE) 5 MG capsule Take 1 capsule (5 mg total) by mouth daily. 90 capsule 1   rosuvastatin (CRESTOR) 40 MG tablet TAKE 1 TABLET BY MOUTH EVERY DAY 90 tablet 1   [DISCONTINUED] escitalopram (LEXAPRO) 10 MG tablet TAKE 1 TABLET BY MOUTH EVERY DAY 90 tablet 0   No current facility-administered medications on file prior to visit.    BP 100/60   Pulse 67   Temp 98.5 F (36.9 C) (Oral)   Ht '5\' 7"'$  (1.702 m)   Wt 174 lb (78.9 kg)   SpO2 97%   BMI 27.25 kg/m       Objective:   Physical Exam Vitals and  nursing note reviewed.  Constitutional:      Appearance: Normal appearance.  Cardiovascular:     Rate and Rhythm: Normal rate and regular rhythm.     Pulses: Normal pulses.  Heart sounds: Normal heart sounds.  Pulmonary:     Effort: Pulmonary effort is normal.     Breath sounds: Normal breath sounds.  Musculoskeletal:     Right knee: No swelling, deformity, bony tenderness or crepitus. Normal range of motion. Tenderness present over the medial joint line and MCL. No patellar tendon tenderness. No MCL laxity. Normal alignment, normal meniscus and normal patellar mobility.     Instability Tests: Anterior drawer test negative. Posterior drawer test negative. Anterior Lachman test negative. Medial McMurray test negative and lateral McMurray test negative.       Legs:  Skin:    General: Skin is warm and dry.  Neurological:     Mental Status: She is alert.      Assessment & Plan:  1. Acute pain of right knee - appears as soft tissue strain. Advised ice, NSAIDS, and compression. May take upwards of 6 weeks to heal completely.  - Follow up as needed  2. Pre-diabetes  - Semaglutide, 2 MG/DOSE, (OZEMPIC, 2 MG/DOSE,) 8 MG/3ML SOPN; Inject 2 mg into the skin once a week.  Dispense: 3 mL; Refill: 2  Dorothyann Peng, NP

## 2022-01-14 ENCOUNTER — Encounter: Payer: Self-pay | Admitting: Adult Health

## 2022-01-14 MED ORDER — ROSUVASTATIN CALCIUM 40 MG PO TABS: 40.0000 mg | ORAL_TABLET | Freq: Every day | ORAL | 1 refills | Status: DC

## 2022-02-08 ENCOUNTER — Other Ambulatory Visit: Payer: Self-pay | Admitting: Adult Health

## 2022-02-08 DIAGNOSIS — R251 Tremor, unspecified: Secondary | ICD-10-CM

## 2022-03-11 ENCOUNTER — Encounter: Payer: Self-pay | Admitting: Physician Assistant

## 2022-03-11 ENCOUNTER — Ambulatory Visit: Payer: Medicare PPO | Admitting: Physician Assistant

## 2022-03-11 VITALS — BP 122/75 | HR 72 | Ht 67.0 in | Wt 169.0 lb

## 2022-03-11 DIAGNOSIS — G3184 Mild cognitive impairment, so stated: Secondary | ICD-10-CM | POA: Diagnosis not present

## 2022-03-11 NOTE — Patient Instructions (Signed)
Good to see you doing well!  Continue Donepezil 10 mg daily  2. Control mood as per PCP and continue group therapy   3. Follow-up in 6 months, call for any changes    RECOMMENDATIONS FOR ALL PATIENTS WITH MEMORY PROBLEMS: 1. Continue to exercise (Recommend 30 minutes of walking everyday, or 3 hours every week) 2. Increase social interactions - continue going to Church and enjoy social gatherings with friends and family 3. Eat healthy, avoid fried foods and eat more fruits and vegetables 4. Maintain adequate blood pressure, blood sugar, and blood cholesterol level. Reducing the risk of stroke and cardiovascular disease also helps promoting better memory. 5. Avoid stressful situations. Live a simple life and avoid aggravations. Organize your time and prepare for the next day in anticipation. 6. Sleep well, avoid any interruptions of sleep and avoid any distractions in the bedroom that may interfere with adequate sleep quality 7. Avoid sugar, avoid sweets as there is a strong link between excessive sugar intake, diabetes, and cognitive impairment The Mediterranean diet has been shown to help patients reduce the risk of progressive memory disorders and reduces cardiovascular risk. This includes eating fish, eat fruits and green leafy vegetables, nuts like almonds and hazelnuts, walnuts, and also use olive oil. Avoid fast foods and fried foods as much as possible. Avoid sweets and sugar as sugar use has been linked to worsening of memory function. 
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
## 2022-03-19 ENCOUNTER — Other Ambulatory Visit: Payer: Self-pay | Admitting: Adult Health

## 2022-03-30 ENCOUNTER — Encounter: Payer: Self-pay | Admitting: Adult Health

## 2022-03-30 NOTE — Telephone Encounter (Signed)
Please advise 

## 2022-04-04 ENCOUNTER — Other Ambulatory Visit: Payer: Self-pay | Admitting: Adult Health

## 2022-04-13 ENCOUNTER — Encounter: Payer: Self-pay | Admitting: Adult Health

## 2022-04-13 ENCOUNTER — Ambulatory Visit: Payer: Medicare PPO | Admitting: Adult Health

## 2022-04-13 VITALS — BP 102/62 | HR 60 | Temp 98.0°F | Ht 67.0 in | Wt 166.0 lb

## 2022-04-13 DIAGNOSIS — I10 Essential (primary) hypertension: Secondary | ICD-10-CM | POA: Diagnosis not present

## 2022-04-13 DIAGNOSIS — Z713 Dietary counseling and surveillance: Secondary | ICD-10-CM

## 2022-04-13 DIAGNOSIS — R7303 Prediabetes: Secondary | ICD-10-CM | POA: Diagnosis not present

## 2022-04-13 LAB — POCT GLYCOSYLATED HEMOGLOBIN (HGB A1C): Hemoglobin A1C: 5.8 % — AB (ref 4.0–5.6)

## 2022-04-13 MED ORDER — OZEMPIC (1 MG/DOSE) 2 MG/1.5ML ~~LOC~~ SOPN: 2.0000 mg | PEN_INJECTOR | SUBCUTANEOUS | 0 refills | Status: AC

## 2022-04-13 NOTE — Progress Notes (Signed)
Subjective:    Patient ID: Ellen [name], female    DOB: 25-Nov-1955, 66 y.o.   MRN: [id number]  HPI 66 year old female who  has a past medical history of Alcohol abuse, Depression, Hyperlipidemia, Hypertension, Pre-diabetes, and Seasonal allergies.  She presents to the office today for follow-up regarding prediabetes, HTN, and obesity.  She is currently managed with Ozempic 1.7 mg weekly.  She has been tolerating this medication well with no side effects.  It does curb her appetite which she enjoys. She is exercising on a routine basis   Her weight is down roughly 28 pounds over the last 8 months.   Lab Results  Component Value Date   HGBA1C 6.2 (A) 07/23/2021   Wt Readings from Last 10 Encounters:  04/13/22 166 lb (75.3 kg)  03/11/22 169 lb (76.7 kg)  01/12/22 174 lb (78.9 kg)  12/16/21 174 lb 8 oz (79.2 kg)  12/02/21 178 lb (80.7 kg)  10/14/21 179 lb (81.2 kg)  09/11/21 188 lb 12.8 oz (85.6 kg)  08/20/21 194 lb (88 kg)  07/23/21 187 lb 3.2 oz (84.9 kg)  05/14/21 180 lb (81.6 kg)   HTN - managed with ramipril 5 mg and HCTZ 25 mg. Denies symptoms of hypotension  BP Readings from Last 3 Encounters:  04/13/22 102/62  03/11/22 122/75  01/12/22 100/60        Review of Systems See HPI   Past Medical History:  Diagnosis Date   Alcohol abuse    Depression    Hyperlipidemia    Hypertension    Pre-diabetes    Seasonal allergies     Social History   Socioeconomic History   Marital status: Soil scientist    Spouse name: Not on file   Number of children: Not on file   Years of education: 16   Highest education level: Associate degree: occupational, Hotel manager, or vocational program  Occupational History   Not on file  Tobacco Use   Smoking status: Former   Smokeless tobacco: Never  Scientific laboratory technician Use: Never used  Substance and Sexual Activity   Alcohol use: Not Currently   Drug use: Never   Sexual activity: Not on file  Other Topics Concern    Not on file  Social History Narrative   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    Married to partner    Right handed   Drinks caffeine   Two story home      Social Determinants of Health   Financial Resource Strain: [place]  (12/16/2021)   Overall Financial Resource Strain (CARDIA)    Difficulty of Paying Living Expenses: Not very hard  Food Insecurity: No Food Insecurity (12/16/2021)   Hunger Vital Sign    Worried About Running Out of Food in the Last Year: Never true    [place] in the Last Year: Never true  Transportation Needs: No Transportation Needs (12/16/2021)   PRAPARE - Hydrologist (Medical): No    Lack of Transportation (Non-Medical): No  Physical Activity: Sufficiently Active (12/16/2021)   Exercise Vital Sign    Days of Exercise per Week: 5 days    Minutes of Exercise per Session: 60 min  Stress: No Stress Concern Present (12/16/2021)   [place]    Feeling of Stress : Only a little  Social Connections: Socially Integrated (12/16/2021)   Social Connection and  Isolation Panel [NHANES]    Frequency of Communication with Friends and Family: Three times a week    Frequency of Social Gatherings with Friends and Family: Three times a week    Attends Religious Services: More than 4 times per year    Active Member of Clubs or Organizations: Yes    Attends Archivist Meetings: 1 to 4 times per year    Marital Status: Living with partner  Intimate Partner Violence: Not At Risk (12/16/2021)   Humiliation, Afraid, Rape, and Kick questionnaire    Fear of Current or Ex-Partner: No    Emotionally Abused: No    Physically Abused: No    Sexually Abused: No    Past Surgical History:  Procedure Laterality Date   ABDOMINAL HYSTERECTOMY  1998    Family History  Problem Relation Age of Onset   High Cholesterol Mother    High blood pressure Mother    Diabetes  Mother    High blood pressure Father    High Cholesterol Father    Diabetes Father    High blood pressure Brother    High Cholesterol Brother     No Known Allergies  Current Outpatient Medications on File Prior to Visit  Medication Sig Dispense Refill   albuterol (VENTOLIN HFA) 108 (90 Base) MCG/ACT inhaler TAKE 2 PUFFS BY MOUTH EVERY 6 HOURS AS NEEDED FOR WHEEZE OR SHORTNESS OF BREATH 8.5 each 0   benztropine (COGENTIN) 1 MG tablet TAKE 1 TABLET BY MOUTH EVERY DAY 90 tablet 1   citalopram (CELEXA) 10 MG tablet TAKE 1 TABLET BY MOUTH EVERY DAY 90 tablet 1   donepezil (ARICEPT) 10 MG tablet Take 1 tablet (10 mg total) by mouth every morning. 90 tablet 3   ezetimibe (ZETIA) 10 MG tablet TAKE 1 TABLET BY MOUTH EVERY DAY 90 tablet 1   fluticasone (FLONASE) 50 MCG/ACT nasal spray SPRAY 2 SPRAYS INTO EACH NOSTRIL EVERY DAY 48 mL 2   hydrochlorothiazide (HYDRODIURIL) 25 MG tablet TAKE 1 TABLET (25 MG TOTAL) BY MOUTH DAILY. 90 tablet 1   Magnesium 250 MG TABS Take 250 mg by mouth daily as needed (cramps).     Multiple Vitamin (MULTIVITAMIN WITH MINERALS) TABS tablet Take 1 tablet by mouth every morning. ALIVE     naproxen sodium (ALEVE) 220 MG tablet Take 220-440 mg by mouth 2 (two) times daily as needed (pain).     ramipril (ALTACE) 5 MG capsule TAKE 1 CAPSULE BY MOUTH EVERY DAY 90 capsule 1   rosuvastatin (CRESTOR) 40 MG tablet Take 1 tablet (40 mg total) by mouth daily. 90 tablet 1   [DISCONTINUED] escitalopram (LEXAPRO) 10 MG tablet TAKE 1 TABLET BY MOUTH EVERY DAY 90 tablet 0   No current facility-administered medications on file prior to visit.    BP 102/62   Pulse 60   Temp 98 F (36.7 C) (Oral)   Ht '5\' 7"'$  (1.702 m)   Wt 166 lb (75.3 kg)   SpO2 96%   BMI 26.00 kg/m       Objective:   Physical Exam Vitals and nursing note reviewed.  Constitutional:      Appearance: Normal appearance.  Cardiovascular:     Rate and Rhythm: Normal rate and regular rhythm.     Pulses: Normal  pulses.     Heart sounds: Normal heart sounds.  Pulmonary:     Effort: Pulmonary effort is normal.     Breath sounds: Normal breath sounds.  Skin:    General:  Skin is warm and dry.     Capillary Refill: Capillary refill takes less than 2 seconds.  Neurological:     General: No focal deficit present.     Mental Status: She is alert and oriented to person, place, and time.  Psychiatric:        Mood and Affect: Mood normal.        Behavior: Behavior normal.        Thought Content: Thought content normal.        Judgment: Judgment normal.       Assessment & Plan:  1. Pre-diabetes  - POC HgB A1c- 5.6  - Continue with Ozempic 2 mg dose - Follow up in 3 months for CPE  - Semaglutide, 1 MG/DOSE, (OZEMPIC, 1 MG/DOSE,) 2 MG/1.5ML SOPN; Inject 2 mg into the skin once a week.  Dispense: 6 mL; Refill: 0  2. Weight loss counseling, encounter for - Follow up in 3 months  - Semaglutide, 1 MG/DOSE, (OZEMPIC, 1 MG/DOSE,) 2 MG/1.5ML SOPN; Inject 2 mg into the skin once a week.  Dispense: 6 mL; Refill: 0  3. Essential hypertension - well controlled.  - likely start dialing back bp medication at the next visit   Dorothyann Peng, NP

## 2022-04-13 NOTE — Patient Instructions (Signed)
Your A1c was 5.6   Please schedule a physical exam for 3 months

## 2022-04-28 ENCOUNTER — Other Ambulatory Visit: Payer: Self-pay | Admitting: Adult Health

## 2022-04-28 DIAGNOSIS — F32A Depression, unspecified: Secondary | ICD-10-CM

## 2022-04-28 DIAGNOSIS — F419 Anxiety disorder, unspecified: Secondary | ICD-10-CM

## 2022-04-29 ENCOUNTER — Other Ambulatory Visit: Payer: Self-pay | Admitting: Adult Health

## 2022-05-11 ENCOUNTER — Other Ambulatory Visit: Payer: Self-pay | Admitting: Adult Health

## 2022-05-11 DIAGNOSIS — R251 Tremor, unspecified: Secondary | ICD-10-CM

## 2022-05-25 ENCOUNTER — Other Ambulatory Visit: Payer: Self-pay | Admitting: Adult Health

## 2022-05-25 MED ORDER — WEGOVY 1 MG/0.5ML ~~LOC~~ SOAJ: 1.0000 mg | SUBCUTANEOUS | 3 refills | Status: DC

## 2022-05-31 ENCOUNTER — Other Ambulatory Visit: Payer: Self-pay | Admitting: Adult Health

## 2022-06-01 ENCOUNTER — Other Ambulatory Visit: Payer: Self-pay | Admitting: Adult Health

## 2022-06-01 DIAGNOSIS — R7303 Prediabetes: Secondary | ICD-10-CM

## 2022-06-01 MED ORDER — SEMAGLUTIDE(0.25 OR 0.5MG/DOS) 2 MG/3ML ~~LOC~~ SOPN: 0.5000 mg | PEN_INJECTOR | SUBCUTANEOUS | 0 refills | Status: DC

## 2022-06-21 ENCOUNTER — Other Ambulatory Visit: Payer: Self-pay | Admitting: Adult Health

## 2022-06-21 DIAGNOSIS — R7303 Prediabetes: Secondary | ICD-10-CM

## 2022-06-22 ENCOUNTER — Other Ambulatory Visit: Payer: Self-pay | Admitting: Adult Health

## 2022-06-22 MED ORDER — WEGOVY 1.7 MG/0.75ML ~~LOC~~ SOAJ: 1.7000 mg | SUBCUTANEOUS | 2 refills | Status: DC

## 2022-07-06 IMAGING — MR MR HEAD WO/W CM
13 series · 48 of 48 positions shown · IV contrast (multihance)
Comparison: 06/15/2019

CLINICAL DATA: Dementia with behavioral disturbance.

EXAM:
MRI HEAD WITHOUT AND WITH CONTRAST
TECHNIQUE: Multiplanar, multiecho pulse sequences of the brain and surrounding
structures were obtained without and with intravenous contrast.
CONTRAST:  15mL MULTIHANCE GADOBENATE DIMEGLUMINE 529 MG/ML IV SOLN

[Series 2: T1 · sagittal · 5.0mm · 0.45mm/px · 2 of 23 slices shown]
[im 1/23]
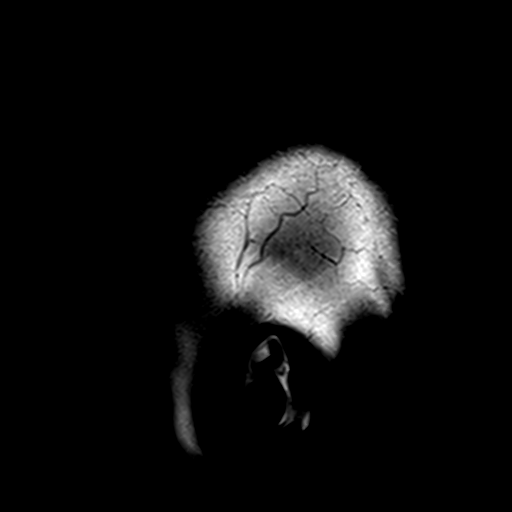
[im 23/23]
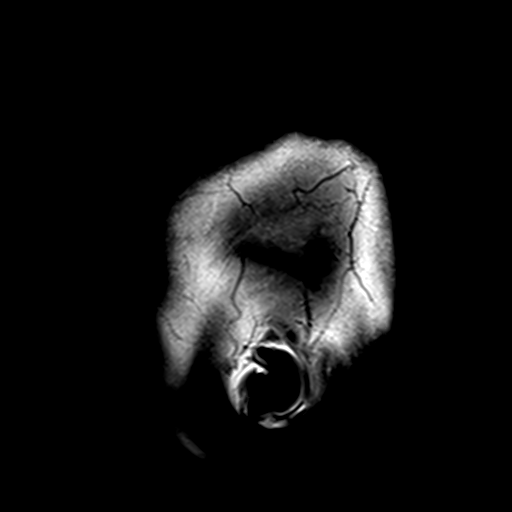

[Series 3: DWI · axial · 3.0mm · 1.80mm/px · z∈[-51,+95]mm · 6 of 100 slices shown (1 of 4)]
[im 1/100]
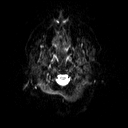
[im 20/100]
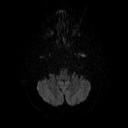
[im 40/100]
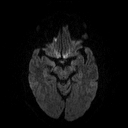
[im 60/100]
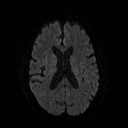
[im 80/100]
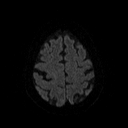
[im 100/100]
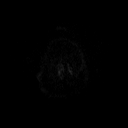

[Series 4: DWI · axial · 3.0mm · 1.80mm/px · z∈[-51,+95]mm · 3 of 47 slices shown (2 of 4)]
[im 1/47]
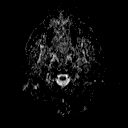
[im 24/47]
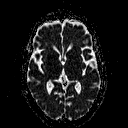
[im 47/47]
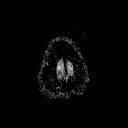

[Series 5: DWI · coronal · 5.0mm · 1.80mm/px · 5 of 72 slices shown (3 of 4)]
[im 1/72]
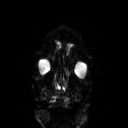
[im 18/72]
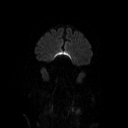
[im 36/72]
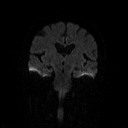
[im 54/72]
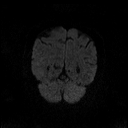
[im 72/72]
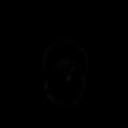

[Series 6: DWI · coronal · 5.0mm · 1.80mm/px · 2 of 36 slices shown (4 of 4)]
[im 1/36]
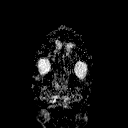
[im 36/36]
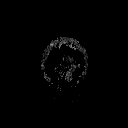

[Series 7: T2 · axial · 5.0mm · 0.60mm/px · 1 of 22 slices shown (1 of 2)]
[im 1/22]
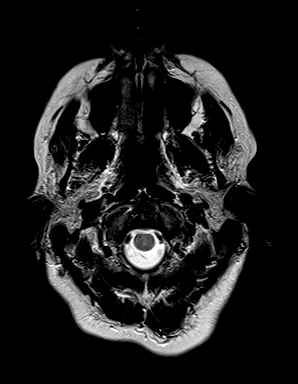

[Series 8: FLAIR · axial · 3.0mm · 0.45mm/px · z∈[-57,+100]mm · 2 of 35 slices shown]
[im 1/35]
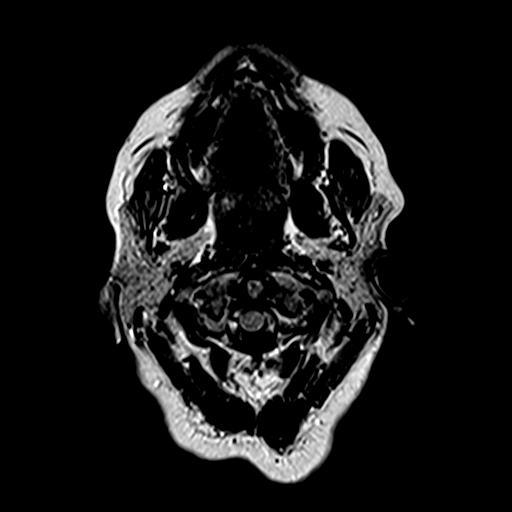
[im 35/35]
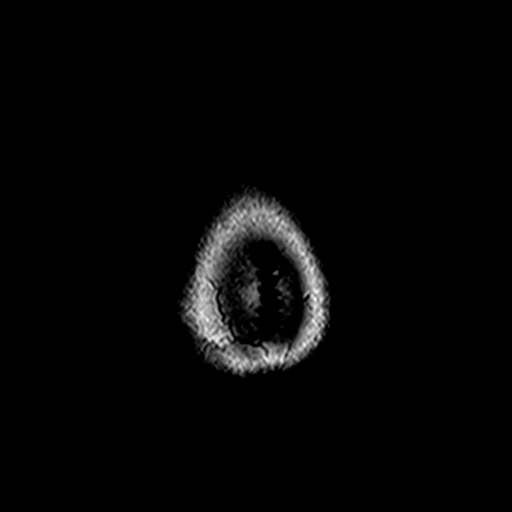

[Series 9: mip_images(sw) · axial · 32.0mm · 0.90mm/px · z∈[-42,+85]mm · 2 of 33 slices shown]
[im 1/33]
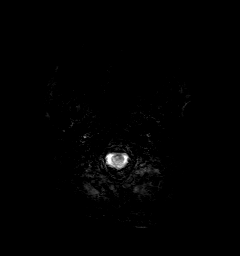
[im 33/33]
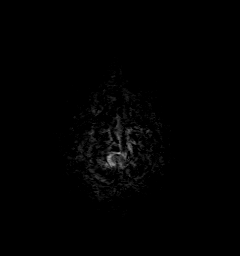

[Series 10: swi_images · axial · 4.0mm · 0.90mm/px · z∈[-56,+99]mm · 3 of 40 slices shown]
[im 1/40]
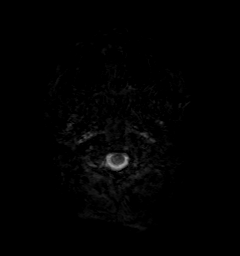
[im 20/40]
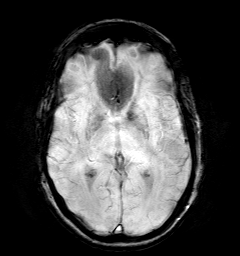
[im 40/40]
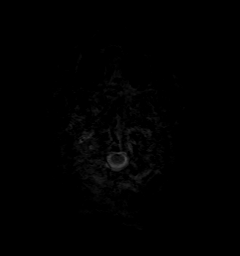

[Series 11: t1_mpr_tra · axial · 1.0mm · 0.75mm/px · z∈[-50,+92]mm · 9 of 144 slices shown]
[im 1/144]
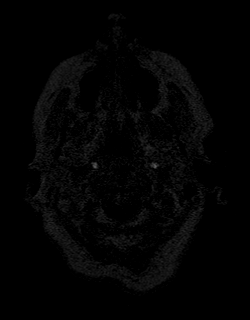
[im 18/144]
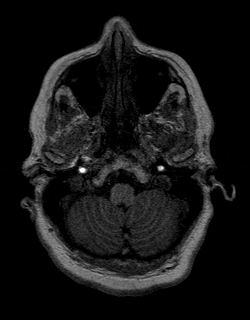
[im 36/144]
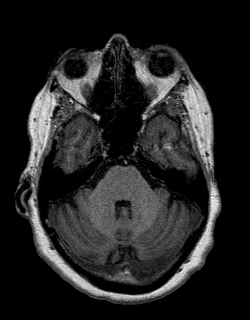
[im 54/144]
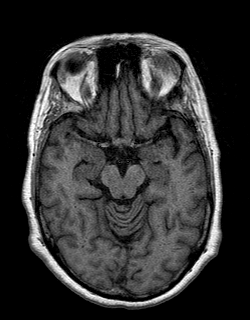
[im 72/144]
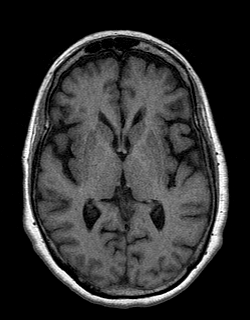
[im 90/144]
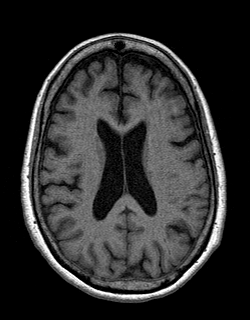
[im 108/144]
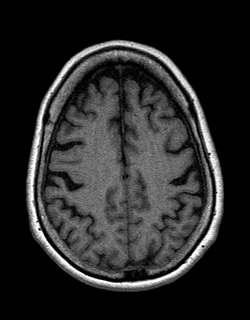
[im 126/144]
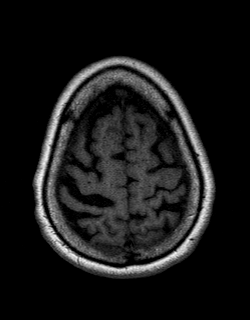
[im 144/144]
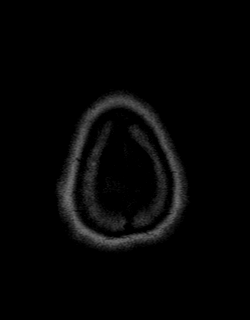

[Series 12: T2 · coronal · 5.0mm · 0.45mm/px · 2 of 27 slices shown (2 of 2)]
[im 1/27]
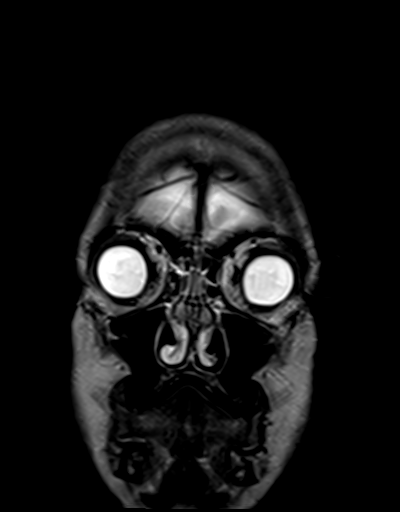
[im 27/27]
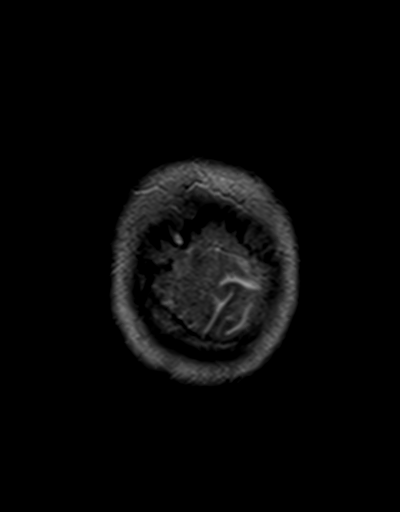

[Series 13: t1_mpr_tra post · axial · 1.0mm · 0.75mm/px · z∈[-50,+92]mm · 9 of 144 slices shown]
[im 1/144]
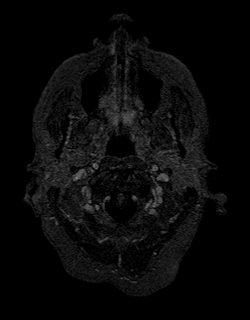
[im 18/144]
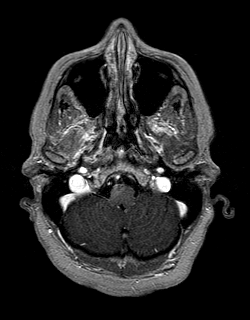
[im 36/144]
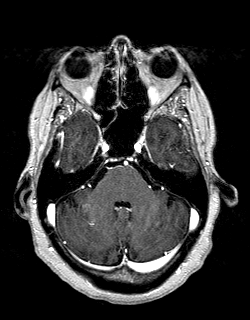
[im 54/144]
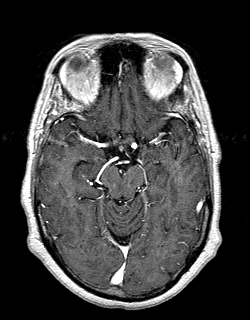
[im 72/144]
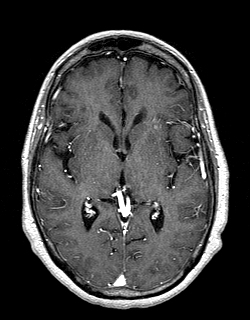
[im 90/144]
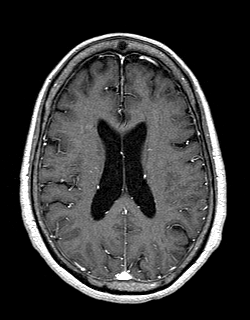
[im 108/144]
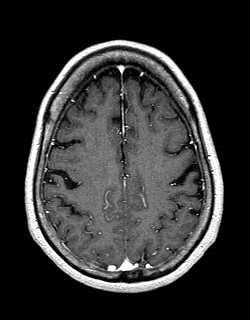
[im 126/144]
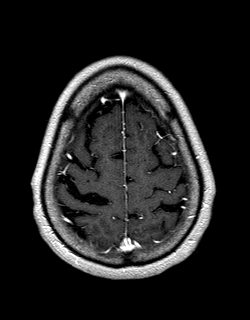
[im 144/144]
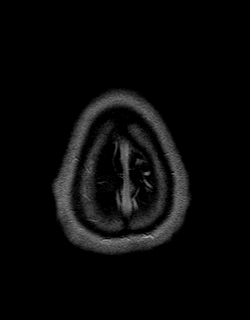

[Series 14: post cor · coronal · 5.0mm · 0.45mm/px · 2 of 27 slices shown]
[im 1/27]
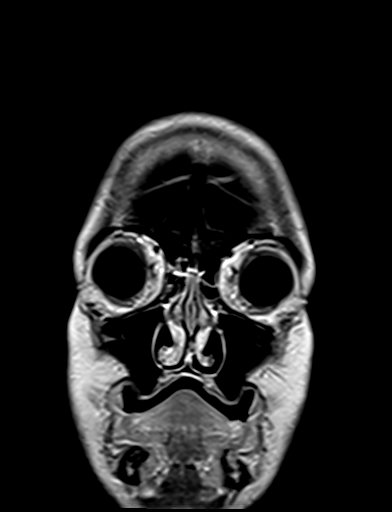
[im 27/27]
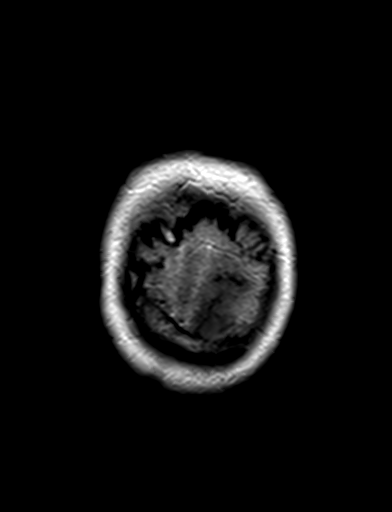

[48 of 48 positions shown; findings below may reference images not displayed]

FINDINGS: Brain: Subjective normal brain volume for age. Minor periventricular
chronic FLAIR signal changes which is stable. No abnormal
enhancement. No hydrocephalus, collection, mass, or chronic blood
products.

Vascular: Normal flow voids and vascular enhancements.

Skull and upper cervical spine: Normal.

Sinuses/Orbits: Normal.
IMPRESSION: No reversible finding or explanation for symptoms. Subjectively
normal brain volume which is also stable from 3737.

## 2022-07-21 ENCOUNTER — Other Ambulatory Visit: Payer: Self-pay | Admitting: Adult Health

## 2022-07-21 DIAGNOSIS — Z1231 Encounter for screening mammogram for malignant neoplasm of breast: Secondary | ICD-10-CM

## 2022-07-22 ENCOUNTER — Other Ambulatory Visit: Payer: Self-pay | Admitting: Adult Health

## 2022-07-26 ENCOUNTER — Encounter: Payer: Self-pay | Admitting: Adult Health

## 2022-07-27 NOTE — Telephone Encounter (Signed)
Spoke to pt and she stated that the meidcation is hard to get. Pt stated she loves being on it but that's the only downside to it. Also, pt advised that she was due for her CPE. Pt will call back to schedule but wants to know if Rx can be sent in.

## 2022-07-28 ENCOUNTER — Encounter: Payer: Self-pay | Admitting: Adult Health

## 2022-07-28 NOTE — Telephone Encounter (Signed)
Product is on backorder. Tried to call pt to see if she knows another pharmacy she would like to use but no answer.

## 2022-07-28 NOTE — Telephone Encounter (Signed)
For this current dose 1.'7mg'$ 

## 2022-07-28 NOTE — Telephone Encounter (Signed)
Pt informed of message below.  Thank you & happy holidays.

## 2022-07-29 ENCOUNTER — Ambulatory Visit: Payer: Medicare PPO | Admitting: Adult Health

## 2022-07-29 MED ORDER — WEGOVY 1 MG/0.5ML ~~LOC~~ SOAJ: SUBCUTANEOUS | 1 refills | Status: DC

## 2022-07-30 NOTE — Telephone Encounter (Signed)
This has been taking care of.

## 2022-08-09 ENCOUNTER — Other Ambulatory Visit: Payer: Self-pay | Admitting: Adult Health

## 2022-08-09 DIAGNOSIS — F419 Anxiety disorder, unspecified: Secondary | ICD-10-CM

## 2022-08-09 NOTE — Telephone Encounter (Signed)
Last OV 04/13/22

## 2022-08-11 ENCOUNTER — Other Ambulatory Visit: Payer: Self-pay | Admitting: Adult Health

## 2022-08-11 DIAGNOSIS — R251 Tremor, unspecified: Secondary | ICD-10-CM

## 2022-08-17 ENCOUNTER — Other Ambulatory Visit: Payer: Self-pay | Admitting: Adult Health

## 2022-08-18 ENCOUNTER — Other Ambulatory Visit: Payer: Self-pay

## 2022-08-18 DIAGNOSIS — F32A Depression, unspecified: Secondary | ICD-10-CM

## 2022-08-18 MED ORDER — CITALOPRAM HYDROBROMIDE 10 MG PO TABS: 10.0000 mg | ORAL_TABLET | Freq: Every day | ORAL | 1 refills | Status: DC

## 2022-09-01 ENCOUNTER — Encounter: Payer: Self-pay | Admitting: Adult Health

## 2022-09-01 ENCOUNTER — Ambulatory Visit (INDEPENDENT_AMBULATORY_CARE_PROVIDER_SITE_OTHER): Payer: Medicare PPO | Admitting: Adult Health

## 2022-09-01 VITALS — BP 120/60 | HR 64 | Temp 98.1°F | Ht 67.0 in | Wt 164.0 lb

## 2022-09-01 DIAGNOSIS — E782 Mixed hyperlipidemia: Secondary | ICD-10-CM | POA: Diagnosis not present

## 2022-09-01 DIAGNOSIS — R7303 Prediabetes: Secondary | ICD-10-CM

## 2022-09-01 DIAGNOSIS — Z1211 Encounter for screening for malignant neoplasm of colon: Secondary | ICD-10-CM

## 2022-09-01 DIAGNOSIS — F419 Anxiety disorder, unspecified: Secondary | ICD-10-CM | POA: Diagnosis not present

## 2022-09-01 DIAGNOSIS — F32A Depression, unspecified: Secondary | ICD-10-CM | POA: Diagnosis not present

## 2022-09-01 DIAGNOSIS — Z1159 Encounter for screening for other viral diseases: Secondary | ICD-10-CM | POA: Diagnosis not present

## 2022-09-01 DIAGNOSIS — R300 Dysuria: Secondary | ICD-10-CM

## 2022-09-01 DIAGNOSIS — Z0000 Encounter for general adult medical examination without abnormal findings: Secondary | ICD-10-CM | POA: Diagnosis not present

## 2022-09-01 DIAGNOSIS — I10 Essential (primary) hypertension: Secondary | ICD-10-CM

## 2022-09-01 DIAGNOSIS — R251 Tremor, unspecified: Secondary | ICD-10-CM

## 2022-09-01 DIAGNOSIS — Z713 Dietary counseling and surveillance: Secondary | ICD-10-CM | POA: Diagnosis not present

## 2022-09-01 DIAGNOSIS — R4189 Other symptoms and signs involving cognitive functions and awareness: Secondary | ICD-10-CM | POA: Diagnosis not present

## 2022-09-01 DIAGNOSIS — R6889 Other general symptoms and signs: Secondary | ICD-10-CM | POA: Diagnosis not present

## 2022-09-01 LAB — IBC + FERRITIN
Ferritin: 73 ng/mL (ref 10.0–291.0)
Iron: 84 ug/dL (ref 42–145)
Saturation Ratios: 22.3 % (ref 20.0–50.0)
TIBC: 376.6 ug/dL (ref 250.0–450.0)
Transferrin: 269 mg/dL (ref 212.0–360.0)

## 2022-09-01 LAB — URINALYSIS, ROUTINE W REFLEX MICROSCOPIC
Bilirubin Urine: NEGATIVE
Ketones, ur: NEGATIVE
Nitrite: POSITIVE — AB
Specific Gravity, Urine: 1.02 (ref 1.000–1.030)
Total Protein, Urine: NEGATIVE
Urine Glucose: NEGATIVE
Urobilinogen, UA: 0.2 (ref 0.0–1.0)
pH: 6.5 (ref 5.0–8.0)

## 2022-09-01 LAB — COMPREHENSIVE METABOLIC PANEL
ALT: 49 U/L — ABNORMAL HIGH (ref 0–35)
AST: 39 U/L — ABNORMAL HIGH (ref 0–37)
Albumin: 4.4 g/dL (ref 3.5–5.2)
Alkaline Phosphatase: 78 U/L (ref 39–117)
BUN: 19 mg/dL (ref 6–23)
CO2: 30 mEq/L (ref 19–32)
Calcium: 10 mg/dL (ref 8.4–10.5)
Chloride: 102 mEq/L (ref 96–112)
Creatinine, Ser: 0.94 mg/dL (ref 0.40–1.20)
GFR: 63.12 mL/min (ref >60.00)
Glucose, Bld: 88 mg/dL (ref 70–99)
Potassium: 4.6 mEq/L (ref 3.5–5.1)
Sodium: 142 mEq/L (ref 135–145)
Total Bilirubin: 0.4 mg/dL (ref 0.2–1.2)
Total Protein: 7.4 g/dL (ref 6.0–8.3)

## 2022-09-01 LAB — CBC WITH DIFFERENTIAL/PLATELET
Basophils Absolute: 0 10*3/uL (ref 0.0–0.1)
Basophils Relative: 0.5 % (ref 0.0–3.0)
Eosinophils Absolute: 0.2 10*3/uL (ref 0.0–0.7)
Eosinophils Relative: 3.3 % (ref 0.0–5.0)
HCT: 38.3 % (ref 36.0–46.0)
Hemoglobin: 12.9 g/dL (ref 12.0–15.0)
Lymphocytes Relative: 42.9 % (ref 12.0–46.0)
Lymphs Abs: 2.2 10*3/uL (ref 0.7–4.0)
MCHC: 33.8 g/dL (ref 30.0–36.0)
MCV: 94.9 fl (ref 78.0–100.0)
Monocytes Absolute: 0.5 10*3/uL (ref 0.1–1.0)
Monocytes Relative: 9 % (ref 3.0–12.0)
Neutro Abs: 2.3 10*3/uL (ref 1.4–7.7)
Neutrophils Relative %: 44.3 % (ref 43.0–77.0)
Platelets: 180 10*3/uL (ref 150.0–400.0)
RBC: 4.03 Mil/uL (ref 3.87–5.11)
RDW: 12.8 % (ref 11.5–15.5)
WBC: 5.2 10*3/uL (ref 4.0–10.5)

## 2022-09-01 LAB — LIPID PANEL
Cholesterol: 155 mg/dL (ref 0–200)
HDL: 55.9 mg/dL (ref >39.00)
LDL Cholesterol: 79 mg/dL (ref 0–99)
NonHDL: 98.89
Total CHOL/HDL Ratio: 3
Triglycerides: 98 mg/dL (ref 0.0–149.0)
VLDL: 19.6 mg/dL (ref 0.0–40.0)

## 2022-09-01 LAB — HEMOGLOBIN A1C: Hgb A1c MFr Bld: 6.4 % (ref 4.6–6.5)

## 2022-09-01 LAB — TSH: TSH: 0.25 u[IU]/mL — ABNORMAL LOW (ref 0.35–5.50)

## 2022-09-01 NOTE — Progress Notes (Signed)
Subjective:    Patient ID: Ellen [name], female    DOB: 1956/01/04, 67 y.o.   MRN: [id number]  HPI Patient presents for yearly preventative medicine examination. She is a pleasant 67 year old female who  has a past medical history of Alcohol abuse, Depression, Hyperlipidemia, Hypertension, Pre-diabetes, and Seasonal allergies.  Pre diabetes/Obesity -managed with wegovy  1.7 mg weekly.  She has been tolerating this medication well with no side effects.  She is exercising on a routine basis and tries to eat healthy. Lab Results  Component Value Date   HGBA1C 5.8 (A) 04/13/2022   Wt Readings from Last 3 Encounters:  09/01/22 164 lb (74.4 kg)  04/13/22 166 lb (75.3 kg)  03/11/22 169 lb (76.7 kg)   HTN -managed with ramipril 5 mg daily and HCTZ 25 mg daily.  She denies symptoms of hypotension. BP Readings from Last 3 Encounters:  09/01/22 120/60  04/13/22 102/62  03/11/22 122/75   Hyperlipidemia - managed with zetia 10 mg daily and crestor 40 mg daily. She denies myalgia or fatigue  Lab Results  Component Value Date   CHOL 303 (H) 05/21/2020   HDL 40 (L) 05/21/2020   LDLCALC 215 (H) 05/21/2020   TRIG 270 (H) 05/21/2020   CHOLHDL 7.6 (H) 05/21/2020   Anxiety/Depression - managed with Celexa 10 mg daily. She feels well controlled.   Cognitive Disorder -seen by neurology roughly every 6 months.  She had memory loss did quite suddenly after alcohol rehab in 2019.  Her initial MoCA was 12 out of 30.  MRI at that time without acute changes, mild diffuse atrophy.  There has been significant improvement with psychotherapy and donezepil 10 mg daily.  She did have neuropsychological evaluation in October 22 which indicated mild neurocognitive disorder possibly related to alcohol abuse.  Her cognitive status has improved after changing her lifestyle, discontinuing alcohol, joining group therapy, and becoming a counselor self and increasing her physical activity.  Her MMSE in July 2023  was 29 out of 30.  Acute issue   Cold Intolerance - she reports that over the last few months she has " always felt cold"   Frequent urination - she reports that over the last month she hs been experiencing urinary frequency, urgency, and dysuria. Denies low back pain, lower pelvic pain or fevers/chills.   All immunizations and health maintenance protocols were reviewed with the patient and needed orders were placed.  Appropriate screening laboratory values were ordered for the patient including screening of hyperlipidemia, renal function and hepatic function. If indicated by BPH, a PSA was ordered.  Medication reconciliation,  past medical history, social history, problem list and allergies were reviewed in detail with the patient  Goals were established with regard to weight loss, exercise, and  diet in compliance with medications  She is up to date on routine mammograms and GYN exam. Is overdue for colonoscopy.    Review of Systems  Constitutional: Negative.   HENT: Negative.    Eyes: Negative.   Respiratory: Negative.    Cardiovascular: Negative.   Gastrointestinal: Negative.   Endocrine: Positive for cold intolerance.  Genitourinary:  Positive for dysuria, frequency and urgency. Negative for difficulty urinating, flank pain and pelvic pain.  Musculoskeletal: Negative.   Skin: Negative.   Allergic/Immunologic: Negative.   Neurological: Negative.   Hematological: Negative.   Psychiatric/Behavioral: Negative.     Past Medical History:  Diagnosis Date   Alcohol abuse    Depression    Hyperlipidemia  Hypertension    Pre-diabetes    Seasonal allergies     Social History   Socioeconomic History   Marital status: Soil scientist    Spouse name: Not on file   Number of children: Not on file   Years of education: 16   Highest education level: Associate degree: occupational, Hotel manager, or vocational program  Occupational History   Not on file  Tobacco Use    Smoking status: Former   Smokeless tobacco: Never  Scientific laboratory technician Use: Never used  Substance and Sexual Activity   Alcohol use: Not Currently   Drug use: Never   Sexual activity: Not on file  Other Topics Concern   Not on file  Social History Narrative   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    Married to partner    Right handed   Drinks caffeine   Two story home      Social Determinants of Health   Financial Resource Strain: [place]  (12/16/2021)   Overall Financial Resource Strain (CARDIA)    Difficulty of Paying Living Expenses: Not very hard  Food Insecurity: No Food Insecurity (12/16/2021)   Hunger Vital Sign    Worried About Running Out of Food in the Last Year: Never true    [place] in the Last Year: Never true  Transportation Needs: No Transportation Needs (12/16/2021)   PRAPARE - Hydrologist (Medical): No    Lack of Transportation (Non-Medical): No  Physical Activity: Sufficiently Active (12/16/2021)   Exercise Vital Sign    Days of Exercise per Week: 5 days    Minutes of Exercise per Session: 60 min  Stress: No Stress Concern Present (12/16/2021)   [place]    Feeling of Stress : Only a little  Social Connections: Socially Integrated (12/16/2021)   Social Connection and Isolation Panel [NHANES]    Frequency of Communication with Friends and Family: Three times a week    Frequency of Social Gatherings with Friends and Family: Three times a week    Attends Religious Services: More than 4 times per year    Active Member of Clubs or Organizations: Yes    Attends Archivist Meetings: 1 to 4 times per year    Marital Status: Living with partner  Intimate Partner Violence: Not At Risk (12/16/2021)   Humiliation, Afraid, Rape, and Kick questionnaire    Fear of Current or Ex-Partner: No    Emotionally Abused: No    Physically Abused: No    Sexually  Abused: No    Past Surgical History:  Procedure Laterality Date   ABDOMINAL HYSTERECTOMY  1998    Family History  Problem Relation Age of Onset   High Cholesterol Mother    High blood pressure Mother    Diabetes Mother    High blood pressure Father    High Cholesterol Father    Diabetes Father    High blood pressure Brother    High Cholesterol Brother     No Known Allergies  Current Outpatient Medications on File Prior to Visit  Medication Sig Dispense Refill   albuterol (VENTOLIN HFA) 108 (90 Base) MCG/ACT inhaler TAKE 2 PUFFS BY MOUTH EVERY 6 HOURS AS NEEDED FOR WHEEZE OR SHORTNESS OF BREATH 8.5 each 0   benztropine (COGENTIN) 1 MG tablet TAKE 1 TABLET BY MOUTH EVERY DAY 90 tablet 1   citalopram (CELEXA) 10  MG tablet Take 1 tablet (10 mg total) by mouth daily. 90 tablet 1   donepezil (ARICEPT) 10 MG tablet Take 1 tablet (10 mg total) by mouth every morning. 90 tablet 3   ezetimibe (ZETIA) 10 MG tablet TAKE 1 TABLET BY MOUTH EVERY DAY 90 tablet 1   fluticasone (FLONASE) 50 MCG/ACT nasal spray SPRAY 2 SPRAYS INTO EACH NOSTRIL EVERY DAY 48 mL 2   hydrochlorothiazide (HYDRODIURIL) 25 MG tablet TAKE 1 TABLET (25 MG TOTAL) BY MOUTH DAILY. 90 tablet 1   Magnesium 250 MG TABS Take 250 mg by mouth daily as needed (cramps).     Multiple Vitamin (MULTIVITAMIN WITH MINERALS) TABS tablet Take 1 tablet by mouth every morning. ALIVE     naproxen sodium (ALEVE) 220 MG tablet Take 220-440 mg by mouth 2 (two) times daily as needed (pain).     OZEMPIC, 2 MG/DOSE, 8 MG/3ML SOPN Inject 2 mg into the skin once a week.     ramipril (ALTACE) 5 MG capsule TAKE 1 CAPSULE BY MOUTH EVERY DAY 90 capsule 1   rosuvastatin (CRESTOR) 40 MG tablet TAKE 1 TABLET BY MOUTH EVERY DAY 90 tablet 1   Semaglutide-Weight Management (WEGOVY) 1.7 MG/0.75ML SOAJ INJECT 1.7 MG INTO THE SKIN ONCE A WEEK. 0.75 mL 2   [DISCONTINUED] escitalopram (LEXAPRO) 10 MG tablet TAKE 1 TABLET BY MOUTH EVERY DAY 90 tablet 0   No  current facility-administered medications on file prior to visit.    BP 120/60   Pulse 64   Temp 98.1 F (36.7 C) (Oral)   Ht '5\' 7"'$  (1.702 m)   Wt 164 lb (74.4 kg)   SpO2 98%   BMI 25.69 kg/m       Objective:   Physical Exam Vitals and nursing note reviewed.  Constitutional:      General: She is not in acute distress.    Appearance: Normal appearance. She is well-developed. She is not ill-appearing.  HENT:     Head: Normocephalic and atraumatic.     Right Ear: Tympanic membrane, ear canal and external ear normal. There is no impacted cerumen.     Left Ear: Tympanic membrane, ear canal and external ear normal. There is no impacted cerumen.     Nose: Nose normal. No congestion or rhinorrhea.     Mouth/Throat:     Mouth: Mucous membranes are moist.     Pharynx: Oropharynx is clear. No oropharyngeal exudate or posterior oropharyngeal erythema.  Eyes:     General:        Right eye: No discharge.        Left eye: No discharge.     Extraocular Movements: Extraocular movements intact.     Conjunctiva/sclera: Conjunctivae normal.     Pupils: Pupils are equal, round, and reactive to light.  Neck:     Thyroid: No thyromegaly.     Vascular: No carotid bruit.     Trachea: No tracheal deviation.  Cardiovascular:     Rate and Rhythm: Normal rate and regular rhythm.     Pulses: Normal pulses.     Heart sounds: Normal heart sounds. No murmur heard.    No friction rub. No gallop.  Pulmonary:     Effort: Pulmonary effort is normal. No respiratory distress.     Breath sounds: Normal breath sounds. No stridor. No wheezing, rhonchi or rales.  Chest:     Chest wall: No tenderness.  Abdominal:     General: Abdomen is flat. Bowel sounds are normal. There  is no distension.     Palpations: Abdomen is soft. There is no mass.     Tenderness: There is no abdominal tenderness. There is no right CVA tenderness, left CVA tenderness, guarding or rebound.     Hernia: No hernia is present.   Musculoskeletal:        General: No swelling, tenderness, deformity or signs of injury. Normal range of motion.     Cervical back: Normal range of motion and neck supple.     Right lower leg: No edema.     Left lower leg: No edema.  Lymphadenopathy:     Cervical: No cervical adenopathy.  Skin:    General: Skin is warm and dry.     Capillary Refill: Capillary refill takes less than 2 seconds.     Coloration: Skin is not jaundiced or pale.     Findings: No bruising, erythema, lesion or rash.     Comments: Finger tips slightly chilled but cap refill WNL   Neurological:     General: No focal deficit present.     Mental Status: She is alert and oriented to person, place, and time.     Cranial Nerves: No cranial nerve deficit.     Sensory: No sensory deficit.     Motor: No weakness.     Coordination: Coordination normal.     Gait: Gait normal.     Deep Tendon Reflexes: Reflexes normal.  Psychiatric:        Mood and Affect: Mood normal.        Behavior: Behavior normal.        Thought Content: Thought content normal.        Judgment: Judgment normal.       Assessment & Plan:  1. Routine general medical examination at a health care facility Today patient counseled on age appropriate routine health concerns for screening and prevention, each reviewed and up to date or declined. Immunizations reviewed and up to date or declined. Labs ordered and reviewed. Risk factors for depression reviewed and negative. Hearing function and visual acuity are intact. ADLs screened and addressed as needed. Functional ability and level of safety reviewed and appropriate. Education, counseling and referrals performed based on assessed risks today. Patient provided with a copy of personalized plan for preventive services.   2. Anxiety and depression - Well controlled. No change in medication  - CBC with Differential/Platelet; Future - Comprehensive metabolic panel; Future - Hemoglobin A1c; Future - Lipid  panel; Future - TSH; Future  3. Pre-diabetes - Continue with Wegovy 1.7 mg weekly.  - CBC with Differential/Platelet; Future - Comprehensive metabolic panel; Future - Hemoglobin A1c; Future - Lipid panel; Future - TSH; Future  4. Weight loss counseling, encounter for - Continue with Wegovy 1.7 mg weekly  - CBC with Differential/Platelet; Future - Comprehensive metabolic panel; Future - Hemoglobin A1c; Future - Lipid panel; Future - TSH; Future  5. Essential hypertension - Well controlled. No Change in medications  - CBC with Differential/Platelet; Future - Comprehensive metabolic panel; Future - Hemoglobin A1c; Future - Lipid panel; Future - TSH; Future  6. Mixed hyperlipidemia - Continue Zetia  - CBC with Differential/Platelet; Future - Comprehensive metabolic panel; Future - Hemoglobin A1c; Future - Lipid panel; Future - TSH; Future  7. Cognitive impairment - Continue with Aricept 10 mg  - CBC with Differential/Platelet; Future - Comprehensive metabolic panel; Future - Hemoglobin A1c; Future - Lipid panel; Future - TSH; Future  8. Tremors of nervous system - Continue  with Cogentin 1 mg  - CBC with Differential/Platelet; Future - Comprehensive metabolic panel; Future - Hemoglobin A1c; Future - Lipid panel; Future - TSH; Future  9. Need for hepatitis C screening test  - Hep C Antibody; Future  10. Colon cancer screening  - Ambulatory referral to Gastroenterology  11. Dysuria  - Urinalysis; Future - Urine Culture; Future  12. Cold intolerance  - CBC with Differential/Platelet; Future - Comprehensive metabolic panel; Future - Lipid panel; Future - TSH; Future - IBC + Ferritin; Future   Dorothyann Peng, NP

## 2022-09-01 NOTE — Patient Instructions (Signed)
It was great seeing you today   We will follow up with you regarding your lab work   Please let me know if you need anything   

## 2022-09-02 ENCOUNTER — Other Ambulatory Visit: Payer: Self-pay | Admitting: Adult Health

## 2022-09-02 DIAGNOSIS — R7989 Other specified abnormal findings of blood chemistry: Secondary | ICD-10-CM

## 2022-09-03 ENCOUNTER — Other Ambulatory Visit: Payer: Self-pay | Admitting: Adult Health

## 2022-09-03 LAB — URINE CULTURE
MICRO NUMBER:: 14439106
SPECIMEN QUALITY:: ADEQUATE

## 2022-09-03 LAB — HEPATITIS C ANTIBODY: Hepatitis C Ab: NONREACTIVE

## 2022-09-03 MED ORDER — AMOXICILLIN-POT CLAVULANATE 875-125 MG PO TABS: 1.0000 | ORAL_TABLET | Freq: Two times a day (BID) | ORAL | 0 refills | Status: AC

## 2022-09-07 ENCOUNTER — Other Ambulatory Visit: Payer: Self-pay | Admitting: Adult Health

## 2022-09-07 MED ORDER — WEGOVY 1.7 MG/0.75ML ~~LOC~~ SOAJ: 1.7000 mg | SUBCUTANEOUS | 2 refills | Status: DC

## 2022-09-07 NOTE — Telephone Encounter (Signed)
Please advise 

## 2022-09-13 ENCOUNTER — Ambulatory Visit: Payer: Medicare PPO | Admitting: Neurology

## 2022-09-13 ENCOUNTER — Ambulatory Visit (INDEPENDENT_AMBULATORY_CARE_PROVIDER_SITE_OTHER): Payer: Medicare PPO | Admitting: Physician Assistant

## 2022-09-13 ENCOUNTER — Encounter: Payer: Self-pay | Admitting: Physician Assistant

## 2022-09-13 VITALS — BP 137/75 | HR 74 | Resp 20 | Ht 67.0 in | Wt 172.0 lb

## 2022-09-13 DIAGNOSIS — G3184 Mild cognitive impairment, so stated: Secondary | ICD-10-CM | POA: Diagnosis not present

## 2022-09-13 NOTE — Patient Instructions (Signed)
Good to see you doing well!  Continue Donepezil 10 mg daily  2. Control mood as per PCP and continue group therapy   3. Follow-up in 6 months, call for any changes    RECOMMENDATIONS FOR ALL PATIENTS WITH MEMORY PROBLEMS: 1. Continue to exercise (Recommend 30 minutes of walking everyday, or 3 hours every week) 2. Increase social interactions - continue going to [place] and enjoy social gatherings with friends and family 3. Eat healthy, avoid fried foods and eat more fruits and vegetables 4. Maintain adequate blood pressure, blood sugar, and blood cholesterol level. Reducing the risk of stroke and cardiovascular disease also helps promoting better memory. 5. Avoid stressful situations. Live a simple life and avoid aggravations. Organize your time and prepare for the next day in anticipation. 6. Sleep well, avoid any interruptions of sleep and avoid any distractions in the bedroom that may interfere with adequate sleep quality 7. Avoid sugar, avoid sweets as there is a strong link between excessive sugar intake, diabetes, and cognitive impairment The Mediterranean diet has been shown to help patients reduce the risk of progressive memory disorders and reduces cardiovascular risk. This includes eating fish, eat fruits and green leafy vegetables, nuts like almonds and hazelnuts, walnuts, and also use olive oil. Avoid fast foods and fried foods as much as possible. Avoid sweets and sugar as sugar use has been linked to worsening of memory function.

## 2022-09-13 NOTE — Progress Notes (Signed)
Assessment/Plan:   Mild cognitive impairment  Ellen [name] is a very pleasant 67 y.o. RH female with a history of hypertension, hyperlipidemia, depression, history of PTSD, prior history of alcohol abuse  (2 years ago) on remission, presenting today in follow-up for evaluation of memory loss.  Personally reviewed of the brain  remarkable for mild diffuse atrophy otherwise without acute changes.Neuropsychological evaluation 06/04/2021 indicated mild neurocognitive disorder, possibly related to alcohol abuse at the time, improved after discontinuation of alcohol intake.  Patient is on donepezil 10 mg daily, tolerating well. She remains active and in today's visit her MMSE is 30/30. Able to perform all her ADLs without difficulty.   Recommendations:   Follow up in  6 months. Continue to control mood as per PCP, continue group therapy weekly, Celexa  Continue to control cardiovascular risk factors Continue donepezil 10 mg daily, side effects discussed.    Subjective:   This patient is here alone. Previous records as well as any outside records available were reviewed prior to todays visit.   Patient was last seen on 03/11/2022 at which time her MMSE was 29/30     Any changes in memory since last visit? 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 " I still struggle with STM but not every day, I may leave the door open" "working helps". She stays very active daily. LTM is good. Ellen [name] (wife)  is amazed on how far I have come. Enjoys travelling, soon to  [place], [place] to [place] on a cruise. repeats oneself?  Endorsed Disoriented when walking into a room?  Patient denies   Leaving objects in unusual places?  Patient denies   Wandering behavior?   denies   Any personality changes since last visit?   denies   Any worsening depression?: denies   Hallucinations  or paranoia?  denies   Seizures?   denies    Any sleep changes?  Denies  vivid dreams, REM behavior or sleepwalking   Sleep apnea?   denies   Any hygiene concerns?   denies   Independent of bathing and dressing?  Endorsed  Does the patient needs help with medications? Patient is in charge   Who is in charge of the finances?  Patient is in charge     Any changes in appetite?  denies    "oh yeah!". Put 10 lbs during the holidays   Patient have trouble swallowing?  denies   Does the patient cook?  Yes  Any kitchen accidents such as leaving the stove on?   denies   Any headaches?    denies   Vision changes? denies Chronic back pain  denies   Ambulates with difficulty?   Has some chronic R knee  discomfort, [facility] . She does other activities such as having a gym at the basement doing Zoom exercises,  Recent falls or head injuries?    denies     Unilateral weakness, numbness or tingling?   denies   Any tremors?  denies   Any anosmia?    denies   Any incontinence of urine?  denies   Any bowel dysfunction?  denies      Patient lives  with Ellen [name] her wife .  Does the patient drive? always keep my GPS at hand.     History on Initial Assessment  10/30/2020: This is a 67 year old right-handed woman with a history of hypertension, hyperlipidemia, depression, alcohol abuse, presenting for evaluation of memory loss. She is accompanied by her spouse of 64 years, Ellen [name], who helps supplement the history today. When asked about her memory, she states "it's not right, something is not right." Ellen [name] reports she went to rehab for alcohol abuse in 2019, and the day she came back, "it sounded like she went through a mild stroke of some sort," she was admitted to the hospital but Ellen [name] did not know the details. Ellen [name] reports that prior to her rehab stay, she was isolating herself, sleeping and drinking a lot, with significant anger issues. She came home in July 2019, she was very weak, unable  to focus, "totally out of her mind." Ellen [name] reports she was given Ativan "and has not been right since getting that drug." She was not the same person at all. She would look at her but had a dazed look. She got better, but in her mind she was fine while other could tell that something was very different. She became more active but did not fully regain her leg muscle strength, having trouble getting in the bed. She would sit than have to life her legs up to lay down.  Some cognitive skills had returned, but the memory portion never came back and has been getting worse. When she first came home, there were hygiene concerns, she needed reminders to shower, change clothes. She can do these now but her selection of clothes has changed ("she used to be a fashionista"). She bathes regularly. Ellen [name] was making sure she took her medications, then after a while she said she could do them herself and was doing okay. However 4 months ago, she stopped eating breakfast and Ellen [name] would find pills on the floor/dresser/her pockets. She was having difficulty completing simple tasks, and they noticed she was leaning to the right side. She saw her PCP with an MMSE of 29/30 in July 2020. I personally reviewed MRI brain in 05/2019 which did not show any acute changes, there was mild diffuse atrophy and chronic microvascular disease. High dose Trazodone was stopped and there was some improvement, however Ellen [name] has found pills in her drawer yesterday. When she was taking Trazodone, she would sleep from 9pm to 3pm, this improved however 3 weeks ago, she was still asleep until 3pm, making Ellen [name] think she was taking the Trazodone again, waking up lethargic with slurred speech. She states she is not taking them, however Ellen [name] reports she had confiscated the medication previously, that she still has pills hidden in the house that Ellen [name] is unaware of. Ellen [name] says they are not hidden. She has gotten lost driving, one night in February 2021  she got lost and then drove into their lawn. She manages one bill and denies forgetting to pay this. She used to cook, but has not been doing this much recently, she has left the faucet running and cabinets open.    Mood is up and down. Ellen [name] notes more anger issues on and off for the past year. She has accused family of inappropriate behavior last November 2021, the story is far-fetched, she does not remember accusing them. Ellen [name] reports hallucinations over the past 2-3 months, mostly in the evenings. She would see people in and out of the house, floating around. The other night she saw someone in the closet. Last weekend was really bad, she woke Ellen [name] up saying there was someone in her  car and would get very angry when told otherwise. She usually gets 6-7 hours of sleep at night and feels drowsy during the day. No REM behavior disorder. She has occasional headaches. She feels dizzy and lightheaded, not sure of her steps when walking. She does not walk like she used to, no falls. She denies any neck/back pain, focal numbness/tingling/weakness. She had an episode of bowel incontinence 2 days ago, had an accident then went to bed, Ellen [name] had to clean her up. She has tremors in both hands. She denies any alcohol use since rehab in June 2019, "I'm a candy person." Her father had dementia in his 82s. No history of concussion.   Past Medical History:  Diagnosis Date   Alcohol abuse    Depression    Hyperlipidemia    Hypertension    Pre-diabetes    Seasonal allergies      Past Surgical History:  Procedure Laterality Date   ABDOMINAL HYSTERECTOMY  1998     PREVIOUS MEDICATIONS:   CURRENT MEDICATIONS:  Outpatient Encounter Medications as of 09/13/2022  Medication Sig   albuterol (VENTOLIN HFA) 108 (90 Base) MCG/ACT inhaler TAKE 2 PUFFS BY MOUTH EVERY 6 HOURS AS NEEDED FOR WHEEZE OR SHORTNESS OF BREATH   benztropine (COGENTIN) 1 MG tablet TAKE 1 TABLET BY MOUTH EVERY DAY   citalopram (CELEXA) 10  MG tablet Take 1 tablet (10 mg total) by mouth daily.   donepezil (ARICEPT) 10 MG tablet Take 1 tablet (10 mg total) by mouth every morning.   ezetimibe (ZETIA) 10 MG tablet TAKE 1 TABLET BY MOUTH EVERY DAY   fluticasone (FLONASE) 50 MCG/ACT nasal spray SPRAY 2 SPRAYS INTO EACH NOSTRIL EVERY DAY   hydrochlorothiazide (HYDRODIURIL) 25 MG tablet TAKE 1 TABLET (25 MG TOTAL) BY MOUTH DAILY.   Magnesium 250 MG TABS Take 250 mg by mouth daily as needed (cramps).   Multiple Vitamin (MULTIVITAMIN WITH MINERALS) TABS tablet Take 1 tablet by mouth every morning. ALIVE   naproxen sodium (ALEVE) 220 MG tablet Take 220-440 mg by mouth 2 (two) times daily as needed (pain).   ramipril (ALTACE) 5 MG capsule TAKE 1 CAPSULE BY MOUTH EVERY DAY   rosuvastatin (CRESTOR) 40 MG tablet TAKE 1 TABLET BY MOUTH EVERY DAY   Semaglutide-Weight Management (WEGOVY) 1.7 MG/0.75ML SOAJ Inject 1.7 mg into the skin once a week.   [DISCONTINUED] escitalopram (LEXAPRO) 10 MG tablet TAKE 1 TABLET BY MOUTH EVERY DAY   No facility-administered encounter medications on file as of 09/13/2022.     Objective:     PHYSICAL EXAMINATION:    VITALS:   Vitals:   09/13/22 0859  BP: 137/75  Pulse: 74  Resp: 20  SpO2: 100%  Weight: 172 lb (78 kg)  Height: '5\' 7"'$  (1.702 m)    GEN:  The patient appears stated age and is in NAD. HEENT:  Normocephalic, atraumatic.   Neurological examination:  General: NAD, well-groomed, appears stated age. Orientation: The patient is alert. Oriented to person, place and date Cranial nerves: There is good facial symmetry.The speech is fluent and clear. No aphasia or dysarthria. Fund of knowledge is appropriate. Recent memory impaired and remote memory is normal.  Attention and concentration are normal.  Able to name objects and repeat phrases.  Hearing is intact to conversational tone.   Delayed recall 3/3 Sensation: Sensation is intact to light touch throughout Motor: Strength is at least  antigravity x4. Tremors: none  DTR's 2/4 in UE/LE      02/12/2021  10:00 AM 10/30/2020   10:00 AM  Montreal Cognitive Assessment   Visuospatial/ Executive (0/5) 3 0  Naming (0/3) 3 3  Attention: Read list of digits (0/2) 2 2  Attention: Read list of letters (0/1) 1 0  Attention: Serial 7 subtraction starting at 100 (0/3) 3 1  Language: Repeat phrase (0/2) 2 2  Language : Fluency (0/1) 1 0  Abstraction (0/2) 2 0  Delayed Recall (0/5) 3 0  Orientation (0/6) 6 4  Total 26 12       09/13/2022   10:00 AM 03/11/2022    5:00 PM  MMSE - Mini Mental State Exam  Orientation to time 5 5  Orientation to Place 5 5  Registration 3 3  Attention/ Calculation 5 4  Recall 3 3  Language- name 2 objects 2 2  Language- repeat 1 1  Language- follow 3 step command 3 3  Language- read & follow direction 1 1  Write a sentence 1 1  Copy design 1 1  Total score 30 29       Movement examination: Tone: There is normal tone in the UE/LE Abnormal movements:  no tremor.  No myoclonus.  No asterixis.   Coordination:  There is no decremation with RAM's. Normal finger to nose  Gait and Station: The patient has no difficulty arising out of a deep-seated chair without the use of the hands. The patient's stride length is good.  Gait is cautious and narrow.   Thank you for allowing [place] the opportunity to participate in the care of this nice patient. Please do not hesitate to contact us for any questions or concerns.   Total time spent on today's visit was 36 minutes dedicated to this patient today, preparing to see patient, examining the patient, ordering tests and/or medications and counseling the patient, documenting clinical information in the EHR or other health record, independently interpreting results and communicating results to the patient/family, discussing treatment and goals, answering patient's questions and coordinating care.  Cc:  Dorothyann Peng, NP  Sharene Butters 09/13/2022 10:55 AM

## 2022-09-14 ENCOUNTER — Ambulatory Visit
Admission: RE | Admit: 2022-09-14 | Discharge: 2022-09-14 | Disposition: A | Discharge status: Home / Self Care | Payer: Medicare PPO | Source: Ambulatory Visit | Attending: Adult Health | Admitting: Adult Health

## 2022-09-14 DIAGNOSIS — Z13820 Encounter for screening for osteoporosis: Secondary | ICD-10-CM

## 2022-09-14 DIAGNOSIS — M85852 Other specified disorders of bone density and structure, left thigh: Secondary | ICD-10-CM | POA: Diagnosis not present

## 2022-09-14 DIAGNOSIS — Z1231 Encounter for screening mammogram for malignant neoplasm of breast: Secondary | ICD-10-CM | POA: Diagnosis not present

## 2022-09-14 DIAGNOSIS — Z780 Asymptomatic menopausal state: Secondary | ICD-10-CM | POA: Diagnosis not present

## 2022-09-16 ENCOUNTER — Ambulatory Visit: Payer: Medicare PPO

## 2022-09-27 ENCOUNTER — Other Ambulatory Visit (INDEPENDENT_AMBULATORY_CARE_PROVIDER_SITE_OTHER): Payer: Medicare PPO

## 2022-09-27 DIAGNOSIS — R7989 Other specified abnormal findings of blood chemistry: Secondary | ICD-10-CM

## 2022-09-27 LAB — TSH: TSH: 0.48 u[IU]/mL (ref 0.35–5.50)

## 2022-09-30 ENCOUNTER — Other Ambulatory Visit: Payer: Medicare PPO

## 2022-10-19 ENCOUNTER — Other Ambulatory Visit: Payer: Self-pay | Admitting: Neurology

## 2022-10-19 DIAGNOSIS — R413 Other amnesia: Secondary | ICD-10-CM

## 2022-11-05 ENCOUNTER — Other Ambulatory Visit: Payer: Self-pay | Admitting: Adult Health

## 2022-11-08 ENCOUNTER — Other Ambulatory Visit: Payer: Self-pay | Admitting: Adult Health

## 2022-11-08 DIAGNOSIS — R251 Tremor, unspecified: Secondary | ICD-10-CM

## 2022-11-26 ENCOUNTER — Encounter: Payer: Self-pay | Admitting: Adult Health

## 2022-11-26 MED ORDER — WEGOVY 1.7 MG/0.75ML ~~LOC~~ SOAJ: 1.7000 mg | SUBCUTANEOUS | 2 refills | Status: DC

## 2022-12-08 ENCOUNTER — Other Ambulatory Visit: Payer: Self-pay | Admitting: Adult Health

## 2022-12-08 MED ORDER — BETAMETHASONE DIPROPIONATE 0.05 % EX CREA: TOPICAL_CREAM | Freq: Two times a day (BID) | CUTANEOUS | 1 refills | Status: DC

## 2022-12-08 NOTE — Telephone Encounter (Signed)
Please advise 

## 2022-12-14 ENCOUNTER — Telehealth: Payer: Self-pay | Admitting: Adult Health

## 2022-12-14 NOTE — Telephone Encounter (Signed)
Contacted Ellen [name] to schedule their annual wellness visit. Appointment made for 12/20/22.  Ellen [name] AWV direct phone # [phone]  Left message & sent my chart message letting patient know her 12/20/22 AWV has been changed to phone appt

## 2022-12-20 ENCOUNTER — Ambulatory Visit (INDEPENDENT_AMBULATORY_CARE_PROVIDER_SITE_OTHER): Payer: Medicare PPO

## 2022-12-20 VITALS — Ht 67.0 in | Wt 162.0 lb

## 2022-12-20 DIAGNOSIS — Z0000 Encounter for general adult medical examination without abnormal findings: Secondary | ICD-10-CM

## 2022-12-20 DIAGNOSIS — Z1211 Encounter for screening for malignant neoplasm of colon: Secondary | ICD-10-CM | POA: Diagnosis not present

## 2022-12-20 NOTE — Patient Instructions (Addendum)
Ellen [name] , Thank you for taking time to come for your Medicare Wellness Visit. I appreciate your ongoing commitment to your health goals. Please review the following plan we discussed and let me know if I can assist you in the future.   These are the goals we discussed:  Goals       Maintain good health (pt-stated)      Both mentally and physical.      Stay Healthy (pt-stated)      I want to become a counselor.        This is a list of the screening recommended for you and due dates:  Health Maintenance  Topic Date Due   DTaP/Tdap/Td vaccine (1 - Tdap) Never done   COVID-19 Vaccine (6 - 2023-24 season) 01/05/2023*   Zoster (Shingles) Vaccine (1 of 2) 03/22/2023*   Pneumonia Vaccine (1 of 2 - PCV) 12/20/2023*   Colon Cancer Screening  12/20/2023*   Flu Shot  03/17/2023   Medicare Annual Wellness Visit  12/20/2023   Mammogram  09/14/2024   DEXA scan (bone density measurement)  Completed   Hepatitis C Screening: USPSTF Recommendation to screen - Ages 18-79 yo.  Completed   HPV Vaccine  Aged Out  *Topic was postponed. The date shown is not the original due date.    Advanced directives: In chart   Conditions/risks identified: None  Next appointment: Follow up in one year for your annual wellness visit     Preventive Care 65 Years and Older, Female Preventive care refers to lifestyle choices and visits with your health care provider that can promote health and wellness. What does preventive care include? A yearly physical exam. This is also called an annual well check. Dental exams once or twice a year. Routine eye exams. Ask your health care provider how often you should have your eyes checked. Personal lifestyle choices, including: Daily care of your teeth and gums. Regular physical activity. Eating a healthy diet. Avoiding tobacco and drug use. Limiting alcohol use. Practicing safe sex. Taking low-dose aspirin every day. Taking vitamin and mineral supplements as  recommended by your health care provider. What happens during an annual well check? The services and screenings done by your health care provider during your annual well check will depend on your age, overall health, lifestyle risk factors, and family history of disease. Counseling  Your health care provider may ask you questions about your: Alcohol use. Tobacco use. Drug use. Emotional well-being. Home and relationship well-being. Sexual activity. Eating habits. History of falls. Memory and ability to understand (cognition). Work and work Astronomer. Reproductive health. Screening  You may have the following tests or measurements: Height, weight, and BMI. Blood pressure. Lipid and cholesterol levels. These may be checked every 5 years, or more frequently if you are over 44 years old. Skin check. Lung cancer screening. You may have this screening every year starting at age 85 if you have a 30-pack-year history of smoking and currently smoke or have quit within the past 15 years. Fecal occult blood test (FOBT) of the stool. You may have this test every year starting at age 59. Flexible sigmoidoscopy or colonoscopy. You may have a sigmoidoscopy every 5 years or a colonoscopy every 10 years starting at age 48. Hepatitis C blood test. Hepatitis B blood test. Sexually transmitted disease (STD) testing. Diabetes screening. This is done by checking your blood sugar (glucose) after you have not eaten for a while (fasting). You may have this done every 1-3  years. Bone density scan. This is done to screen for osteoporosis. You may have this done starting at age 25. Mammogram. This may be done every 1-2 years. Talk to your health care provider about how often you should have regular mammograms. Talk with your health care provider about your test results, treatment options, and if necessary, the need for more tests. Vaccines  Your health care provider may recommend certain vaccines, such  as: Influenza vaccine. This is recommended every year. Tetanus, diphtheria, and acellular pertussis (Tdap, Td) vaccine. You may need a Td booster every 10 years. Zoster vaccine. You may need this after age 30. Pneumococcal 13-valent conjugate (PCV13) vaccine. One dose is recommended after age 73. Pneumococcal polysaccharide (PPSV23) vaccine. One dose is recommended after age 54. Talk to your health care provider about which screenings and vaccines you need and how often you need them. This information is not intended to replace advice given to you by your health care provider. Make sure you discuss any questions you have with your health care provider. Document Released: 08/29/2015 Document Revised: 04/21/2016 Document Reviewed: 06/03/2015 Elsevier Interactive Patient Education  2017 [place] Prevention in the Home Falls can cause injuries. They can happen to people of all ages. There are many things you can do to make your home safe and to help prevent falls. What can I do on the outside of my home? Regularly fix the edges of walkways and driveways and fix any cracks. Remove anything that might make you trip as you walk through a door, such as a raised step or threshold. Trim any bushes or trees on the path to your home. Use bright outdoor lighting. Clear any walking paths of anything that might make someone trip, such as rocks or tools. Regularly check to see if handrails are loose or broken. Make sure that both sides of any steps have handrails. Any raised decks and porches should have guardrails on the edges. Have any leaves, snow, or ice cleared regularly. Use sand or salt on walking paths during winter. Clean up any spills in your garage right away. This includes oil or grease spills. What can I do in the bathroom? Use night lights. Install grab bars by the toilet and in the tub and shower. Do not use towel bars as grab bars. Use non-skid mats or decals in the tub or  shower. If you need to sit down in the shower, use a plastic, non-slip stool. Keep the floor dry. Clean up any water that spills on the floor as soon as it happens. Remove soap buildup in the tub or shower regularly. Attach bath mats securely with double-sided non-slip rug tape. Do not have throw rugs and other things on the floor that can make you trip. What can I do in the bedroom? Use night lights. Make sure that you have a light by your bed that is easy to reach. Do not use any sheets or blankets that are too big for your bed. They should not hang down onto the floor. Have a firm chair that has side arms. You can use this for support while you get dressed. Do not have throw rugs and other things on the floor that can make you trip. What can I do in the kitchen? Clean up any spills right away. Avoid walking on wet floors. Keep items that you use a lot in easy-to-reach places. If you need to reach something above you, use a strong step stool that has a grab  bar. Keep electrical cords out of the way. Do not use floor polish or wax that makes floors slippery. If you must use wax, use non-skid floor wax. Do not have throw rugs and other things on the floor that can make you trip. What can I do with my stairs? Do not leave any items on the stairs. Make sure that there are handrails on both sides of the stairs and use them. Fix handrails that are broken or loose. Make sure that handrails are as long as the stairways. Check any carpeting to make sure that it is firmly attached to the stairs. Fix any carpet that is loose or worn. Avoid having throw rugs at the top or bottom of the stairs. If you do have throw rugs, attach them to the floor with carpet tape. Make sure that you have a light switch at the top of the stairs and the bottom of the stairs. If you do not have them, ask someone to add them for you. What else can I do to help prevent falls? Wear shoes that: Do not have high heels. Have  rubber bottoms. Are comfortable and fit you well. Are closed at the toe. Do not wear sandals. If you use a stepladder: Make sure that it is fully opened. Do not climb a closed stepladder. Make sure that both sides of the stepladder are locked into place. Ask someone to hold it for you, if possible. Clearly mark and make sure that you can see: Any grab bars or handrails. First and last steps. Where the edge of each step is. Use tools that help you move around (mobility aids) if they are needed. These include: Canes. Walkers. Scooters. Crutches. Turn on the lights when you go into a dark area. Replace any light bulbs as soon as they burn out. Set up your furniture so you have a clear path. Avoid moving your furniture around. If any of your floors are uneven, fix them. If there are any pets around you, be aware of where they are. Review your medicines with your doctor. Some medicines can make you feel dizzy. This can increase your chance of falling. Ask your doctor what other things that you can do to help prevent falls. This information is not intended to replace advice given to you by your health care provider. Make sure you discuss any questions you have with your health care provider. Document Released: 05/29/2009 Document Revised: 01/08/2016 Document Reviewed: 09/06/2014 Elsevier Interactive Patient Education  2017 [employer].

## 2022-12-20 NOTE — Progress Notes (Signed)
Subjective:   Ellen [name] is a 67 y.o. female who presents for Medicare Annual (Subsequent) preventive examination.  Review of Systems    Virtual Visit via Telephone Note  I connected with  Ellen [name] on 12/20/22 at 11:30 AM EDT by telephone and verified that I am speaking with the correct person using two identifiers.  Location: Patient: Home Provider: Office Persons participating in the virtual visit: patient/Nurse Health Advisor   I discussed the limitations, risks, security and privacy concerns of performing an evaluation and management service by telephone and the availability of in person appointments. The patient expressed understanding and agreed to proceed.  Interactive audio and video telecommunications were attempted between this nurse and patient, however failed, due to patient having technical difficulties OR patient did not have access to video capability.  We continued and completed visit with audio only.  Some vital signs may be absent or patient reported.   Tillie Rung, LPN  Cardiac Risk Factors include: advanced age (>68men, >61 women);hypertension     Objective:    Today's Vitals   12/20/22 1120  Weight: 162 lb (73.5 kg)  Height: 5\' 7"  (1.702 m)   Body mass index is 25.37 kg/m.     12/20/2022   11:30 AM 09/13/2022    9:06 AM 03/11/2022    9:01 AM 12/16/2021   10:56 AM 09/11/2021    8:16 AM 02/12/2021   10:26 AM 01/18/2021    5:44 PM  Advanced Directives  Does Patient Have a Medical Advance Directive? Yes Yes Yes Yes Yes Yes No  Type of Estate agent of [place];Living will  Healthcare Power of [place];Out of facility DNR (pink MOST or yellow form);Living will Healthcare Power of [place];Living will Healthcare Power of [place];Living will;Out of facility DNR (pink MOST or yellow form)    Does patient want to make changes to medical advance directive? No - Patient declined   No - Patient declined     Copy of  Healthcare Power of Attorney in Chart? Yes - validated most recent copy scanned in chart (See row information)   No - copy requested     Would patient like information on creating a medical advance directive?       No - Patient declined    Current Medications (verified) Outpatient Encounter Medications as of 12/20/2022  Medication Sig   betamethasone dipropionate 0.05 % cream Apply topically 2 (two) times daily.   albuterol (VENTOLIN HFA) 108 (90 Base) MCG/ACT inhaler TAKE 2 PUFFS BY MOUTH EVERY 6 HOURS AS NEEDED FOR WHEEZE OR SHORTNESS OF BREATH   benztropine (COGENTIN) 1 MG tablet TAKE 1 TABLET BY MOUTH EVERY DAY   citalopram (CELEXA) 10 MG tablet Take 1 tablet (10 mg total) by mouth daily.   donepezil (ARICEPT) 10 MG tablet TAKE 1 TABLET (10 MG TOTAL) BY MOUTH EVERY MORNING.   ezetimibe (ZETIA) 10 MG tablet TAKE 1 TABLET BY MOUTH EVERY DAY   fluticasone (FLONASE) 50 MCG/ACT nasal spray SPRAY 2 SPRAYS INTO EACH NOSTRIL EVERY DAY   hydrochlorothiazide (HYDRODIURIL) 25 MG tablet TAKE 1 TABLET (25 MG TOTAL) BY MOUTH DAILY.   Magnesium 250 MG TABS Take 250 mg by mouth daily as needed (cramps).   Multiple Vitamin (MULTIVITAMIN WITH MINERALS) TABS tablet Take 1 tablet by mouth every morning. ALIVE   naproxen sodium (ALEVE) 220 MG tablet Take 220-440 mg by mouth 2 (two) times daily as needed (pain).   ramipril (ALTACE) 5 MG capsule TAKE 1 CAPSULE  BY MOUTH EVERY DAY   rosuvastatin (CRESTOR) 40 MG tablet TAKE 1 TABLET BY MOUTH EVERY DAY   Semaglutide-Weight Management (WEGOVY) 1.7 MG/0.75ML SOAJ Inject 1.7 mg into the skin once a week.   [DISCONTINUED] escitalopram (LEXAPRO) 10 MG tablet TAKE 1 TABLET BY MOUTH EVERY DAY   No facility-administered encounter medications on file as of 12/20/2022.    Allergies (verified) Patient has no known allergies.   History: Past Medical History:  Diagnosis Date   Alcohol abuse    Depression    Hyperlipidemia    Hypertension    Pre-diabetes    Seasonal  allergies    Past Surgical History:  Procedure Laterality Date   ABDOMINAL HYSTERECTOMY  1998   Family History  Problem Relation Age of Onset   High Cholesterol Mother    High blood pressure Mother    Diabetes Mother    High blood pressure Father    High Cholesterol Father    Diabetes Father    High blood pressure Brother    High Cholesterol Brother    Breast cancer Neg Hx    Social History   Socioeconomic History   Marital status: Media planner    Spouse name: Not on file   Number of children: Not on file   Years of education: 16   Highest education level: Associate degree: occupational, Scientist, product/process development, or vocational program  Occupational History   Not on file  Tobacco Use   Smoking status: Former   Smokeless tobacco: Never  Building services engineer Use: Never used  Substance and Sexual Activity   Alcohol use: Not Currently   Drug use: Never   Sexual activity: Not on file  Other Topics Concern   Not on file  Social History Narrative   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, 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   Married to partner    Right handed   Drinks caffeine   Two story home      Social Determinants of Health   Financial Resource Strain: Low Risk  (12/20/2022)   Overall Financial Resource Strain (CARDIA)    Difficulty of Paying Living Expenses: Not hard at all  Food Insecurity: No Food Insecurity (12/20/2022)   Hunger Vital Sign    Worried About Running Out of Food in the Last Year: Never true    Ran Out of Food in the Last Year: Never true  Transportation Needs: No Transportation Needs (12/20/2022)   PRAPARE - Administrator, Civil Service (Medical): No    Lack of Transportation (Non-Medical): No  Physical Activity: Sufficiently Active (12/20/2022)   Exercise Vital Sign    Days of Exercise per Week: 4 days    Minutes of Exercise per Session: 120 min  Stress: No Stress Concern Present (12/20/2022)   [employer] of Occupational Health  - Occupational Stress Questionnaire    Feeling of Stress : Not at all  Social Connections: Socially Integrated (12/20/2022)   Social Connection and Isolation Panel [NHANES]    Frequency of Communication with Friends and Family: More than three times a week    Frequency of Social Gatherings with Friends and Family: More than three times a week    Attends Religious Services: More than 4 times per year    Active Member of [employer] or Organizations: Yes    Attends Engineer, structural: More than 4 times per year    Marital Status: Living with partner    Tobacco  Counseling Counseling given: Not Answered   Clinical Intake:  Pre-visit preparation completed: No  Pain : No/denies pain     BMI - recorded: 26.94 Nutritional Risks: None Diabetes: No  How often do you need to have someone help you when you read instructions, pamphlets, or other written materials from your doctor or pharmacy?: 1 - Never  Diabetic?  No  Interpreter Needed?: No  Information entered by :: Theresa Mulligan LPN   Activities of Daily Living    12/20/2022   11:27 AM  In your present state of health, do you have any difficulty performing the following activities:  Hearing? 0  Vision? 0  Difficulty concentrating or making decisions? 0  Walking or climbing stairs? 0  Dressing or bathing? 0  Doing errands, shopping? 0  Preparing Food and eating ? N  Using the Toilet? N  In the past six months, have you accidently leaked urine? N  Do you have problems with loss of bowel control? N  Managing your Medications? N  Managing your Finances? N  Housekeeping or managing your Housekeeping? N    Patient Care Team: Shirline Frees, NP as PCP - General (Family Medicine) Van Clines, MD as Consulting Physician (Neurology)  Indicate any recent Medical Services you may have received from other than [facility] providers in the past year (date may be approximate).     Assessment:   This is a routine wellness  examination for [place].  Hearing/Vision screen Hearing Screening - Comments:: Denies hearing difficulties   Vision Screening - Comments::  - up to date with routine eye exams with  [facility]  Dietary issues and exercise activities discussed: Exercise limited by: None identified   Goals Addressed               This Visit's Progress     Stay Healthy (pt-stated)        I want to become a counselor.       Depression Screen    12/20/2022   11:25 AM 12/16/2021   10:48 AM 01/28/2021   10:57 AM 05/21/2020   10:00 AM  PHQ 2/9 Scores  PHQ - 2 Score 0 0  0  PHQ- 9 Score    0     Information is confidential and restricted. Go to Review Flowsheets to unlock data.    Fall Risk    12/20/2022   11:28 AM 09/13/2022    9:06 AM 03/11/2022    9:00 AM 12/16/2021   10:53 AM 12/15/2021    4:21 PM  Fall Risk   Falls in the past year? 0 1 0 0 0  Number falls in past yr: 0 0 0 0 0  Injury with Fall? 0 1 0 0 0  Risk for fall due to : No Fall Risks   No Fall Risks   Follow up Falls prevention discussed Falls evaluation completed       FALL RISK PREVENTION PERTAINING TO THE HOME:  Any stairs in or around the home? Yes  If so, are there any without handrails? No  Home free of loose throw rugs in walkways, pet beds, electrical cords, etc? Yes  Adequate lighting in your home to reduce risk of falls? No   ASSISTIVE DEVICES UTILIZED TO PREVENT FALLS:  Life alert? No  Use of a cane, walker or w/c? No  Grab bars in the bathroom? Yes  Shower chair or bench in shower? No  Elevated toilet seat or a handicapped toilet? No  TIMED UP AND GO:  Was the test performed? No . Audio Visit   Cognitive Function:    09/13/2022   10:00 AM 03/11/2022    5:00 PM  MMSE - Mini Mental State Exam  Orientation to time 5 5  Orientation to Place 5 5  Registration 3 3  Attention/ Calculation 5 4  Recall 3 3  Language- name 2 objects 2 2  Language- repeat 1 1  Language- follow 3 step command 3 3  Language-  read & follow direction 1 1  Write a sentence 1 1  Copy design 1 1  Total score 30 29      02/12/2021   10:00 AM 10/30/2020   10:00 AM  Montreal Cognitive Assessment   Visuospatial/ Executive (0/5) 3 0  Naming (0/3) 3 3  Attention: Read list of digits (0/2) 2 2  Attention: Read list of letters (0/1) 1 0  Attention: Serial 7 subtraction starting at 100 (0/3) 3 1  Language: Repeat phrase (0/2) 2 2  Language : Fluency (0/1) 1 0  Abstraction (0/2) 2 0  Delayed Recall (0/5) 3 0  Orientation (0/6) 6 4  Total 26 12      12/20/2022   11:30 AM 12/16/2021   10:57 AM  6CIT Screen  What Year? 0 points 0 points  What month? 0 points 0 points  What time? 0 points 0 points  Count back from 20 0 points 0 points  Months in reverse 0 points 0 points  Repeat phrase 0 points 0 points  Total Score 0 points 0 points    Immunizations Immunization History  Administered Date(s) Administered   Influenza, High Dose Seasonal PF 06/17/2021   Influenza,inj,Quad PF,6+ Mos 06/21/2018, 05/21/2020   Influenza-Unspecified 05/20/2022   PFIZER(Purple Top)SARS-COV-2 Vaccination 10/24/2019, 11/13/2019, 07/25/2020, 05/20/2022   Pfizer Covid-19 Vaccine Bivalent Booster 57yrs & up 10/02/2021    TDAP status: Due, Education has been provided regarding the importance of this vaccine. Advised may receive this vaccine at local pharmacy or Health Dept. Aware to provide a copy of the vaccination record if obtained from local pharmacy or Health Dept. Verbalized acceptance and understanding.  Flu Vaccine status: Up to date  Pneumococcal vaccine status: Due, Education has been provided regarding the importance of this vaccine. Advised may receive this vaccine at local pharmacy or Health Dept. Aware to provide a copy of the vaccination record if obtained from local pharmacy or Health Dept. Verbalized acceptance and understanding.  Covid-19 vaccine status: Completed vaccines  Qualifies for Shingles Vaccine? Yes    Zostavax completed No   Shingrix Completed?: No.    Education has been provided regarding the importance of this vaccine. Patient has been advised to call insurance company to determine out of pocket expense if they have not yet received this vaccine. Advised may also receive vaccine at local pharmacy or Health Dept. Verbalized acceptance and understanding.  Screening Tests Health Maintenance  Topic Date Due   DTaP/Tdap/Td (1 - Tdap) Never done   COVID-19 Vaccine (6 - 2023-24 season) 01/05/2023 (Originally 07/15/2022)   Zoster Vaccines- Shingrix (1 of 2) 03/22/2023 (Originally [birth year]1974)   Pneumonia Vaccine 32+ Years old (1 of 2 - PCV) 12/20/2023 (Originally 11/26/1961)   COLONOSCOPY (Pts 45-57yrs Insurance coverage will need to be confirmed)  12/20/2023 (Originally 08/16/2022)   INFLUENZA VACCINE  03/17/2023   Medicare Annual Wellness (AWV)  12/20/2023   MAMMOGRAM  09/14/2024   DEXA SCAN  Completed   Hepatitis C Screening  Completed  HPV VACCINES  Aged Out    Health Maintenance  Health Maintenance Due  Topic Date Due   DTaP/Tdap/Td (1 - Tdap) Never done    Colorectal cancer screening: Referral to GI placed 12/20/22. Pt aware the office will call re: appt.  Mammogram status: Completed 09/14/22. Repeat every year  Bone Density status: Completed 09/14/22. Results reflect: Bone density results: OSTEOPOROSIS. Repeat every   years.  Lung Cancer Screening: (Low Dose CT Chest recommended if Age 45-80 years, 30 pack-year currently smoking OR have quit w/in 15years.) does not qualify.     Additional Screening:  Hepatitis C Screening: does qualify; Completed 09/01/22  Vision Screening: Recommended annual ophthalmology exams for early detection of glaucoma and other disorders of the eye. Is the patient up to date with their annual eye exam?  Yes  Who is the provider or what is the name of the office in which the patient attends annual eye exams? [facility] If pt is not established with a  provider, would they like to be referred to a provider to establish care? No .   Dental Screening: Recommended annual dental exams for proper oral hygiene  Community Resource Referral / Chronic Care Management:  CRR required this visit?  No   CCM required this visit?  No      Plan:     I have personally reviewed and noted the following in the patient's chart:   Medical and social history Use of alcohol, tobacco or illicit drugs  Current medications and supplements including opioid prescriptions. Patient is not currently taking opioid prescriptions. Functional ability and status Nutritional status Physical activity Advanced directives List of other physicians Hospitalizations, surgeries, and ER visits in previous 12 months Vitals Screenings to include cognitive, depression, and falls Referrals and appointments  In addition, I have reviewed and discussed with patient certain preventive protocols, quality metrics, and best practice recommendations. A written personalized care plan for preventive services as well as general preventive health recommendations were provided to patient.     Tillie Rung, LPN   4/0/9811   Nurse Notes: None

## 2022-12-31 ENCOUNTER — Ambulatory Visit (INDEPENDENT_AMBULATORY_CARE_PROVIDER_SITE_OTHER): Payer: Medicare PPO | Admitting: Adult Health

## 2022-12-31 ENCOUNTER — Encounter: Payer: Self-pay | Admitting: Adult Health

## 2022-12-31 VITALS — BP 110/62 | HR 70 | Temp 98.0°F | Ht 67.0 in | Wt 162.0 lb

## 2022-12-31 DIAGNOSIS — Z713 Dietary counseling and surveillance: Secondary | ICD-10-CM | POA: Diagnosis not present

## 2022-12-31 DIAGNOSIS — R7303 Prediabetes: Secondary | ICD-10-CM

## 2022-12-31 DIAGNOSIS — I10 Essential (primary) hypertension: Secondary | ICD-10-CM | POA: Diagnosis not present

## 2022-12-31 LAB — POCT GLYCOSYLATED HEMOGLOBIN (HGB A1C): Hemoglobin A1C: 5.6 % (ref 4.0–5.6)

## 2022-12-31 MED ORDER — WEGOVY 2.4 MG/0.75ML ~~LOC~~ SOAJ: 2.4000 mg | SUBCUTANEOUS | 0 refills | Status: DC

## 2022-12-31 NOTE — Progress Notes (Signed)
Subjective:    Patient ID: Ellen [name], female    DOB: [birth year], 67 y.o.   MRN: [id number]  HPI 67 year old female who  has a past medical history of Alcohol abuse, Depression, Hyperlipidemia, Hypertension, Pre-diabetes, and Seasonal allergies.  She presents to the office today for follow-up regarding prediabetes and obesity.  She is managed with Wegovy 1.7 mg weekly.  She has been tolerating this medication well with no side effects.  She is exercising on a routine basis and tries to eat healthy. She has been able to lose about 32 pounds.  Lab Results  Component Value Date   HGBA1C 6.4 09/01/2022   Wt Readings from Last 20 Encounters:  12/31/22 162 lb (73.5 kg)  12/20/22 162 lb (73.5 kg)  09/13/22 172 lb (78 kg)  09/01/22 164 lb (74.4 kg)  04/13/22 166 lb (75.3 kg)  03/11/22 169 lb (76.7 kg)  01/12/22 174 lb (78.9 kg)  12/16/21 174 lb 8 oz (79.2 kg)  12/02/21 178 lb (80.7 kg)  10/14/21 179 lb (81.2 kg)  09/11/21 188 lb 12.8 oz (85.6 kg)  08/20/21 194 lb (88 kg)  07/23/21 187 lb 3.2 oz (84.9 kg)  05/14/21 180 lb (81.6 kg)  05/05/21 180 lb (81.6 kg)  02/12/21 166 lb (75.3 kg)  01/18/21 160 lb (72.6 kg)  12/18/20 150 lb (68 kg)  10/30/20 164 lb (74.4 kg)  10/21/20 162 lb 2 oz (73.5 kg)   HTN -managed with ramipril 5 mg daily and HCTZ 25 mg daily.  She has been having some dizziness and lightheadedness when changing positions. BP Readings from Last 3 Encounters:  12/31/22 110/62  09/13/22 137/75  09/01/22 120/60   Review of Systems See HPI   Past Medical History:  Diagnosis Date   Alcohol abuse    Depression    Hyperlipidemia    Hypertension    Pre-diabetes    Seasonal allergies     Social History   Socioeconomic History   Marital status: Media planner    Spouse name: Not on file   Number of children: Not on file   Years of education: 16   Highest education level: Associate degree: occupational, Scientist, product/process development, or vocational program  Occupational  History   Not on file  Tobacco Use   Smoking status: Former   Smokeless tobacco: Never  Building services engineer Use: Never used  Substance and Sexual Activity   Alcohol use: Not Currently   Drug use: Never   Sexual activity: Not on file  Other Topics Concern   Not on file  Social History Narrative   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, 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   Married to partner    Right handed   Drinks caffeine   Two story home      Social Determinants of Health   Financial Resource Strain: Low Risk  (12/29/2022)   Overall Financial Resource Strain (CARDIA)    Difficulty of Paying Living Expenses: Not hard at all  Food Insecurity: No Food Insecurity (12/29/2022)   Hunger Vital Sign    Worried About Running Out of Food in the Last Year: Never true    Ran Out of Food in the Last Year: Never true  Transportation Needs: No Transportation Needs (12/29/2022)   PRAPARE - Administrator, Civil Service (Medical): No    Lack of Transportation (Non-Medical): No  Physical Activity: Sufficiently Active (12/29/2022)   Exercise Vital  Sign    Days of Exercise per Week: 5 days    Minutes of Exercise per Session: 90 min  Stress: No Stress Concern Present (12/29/2022)   [employer] of Occupational Health - Occupational Stress Questionnaire    Feeling of Stress : Only a little  Social Connections: Unknown (12/29/2022)   Social Connection and Isolation Panel [NHANES]    Frequency of Communication with Friends and Family: More than three times a week    Frequency of Social Gatherings with Friends and Family: More than three times a week    Attends Religious Services: Patient declined    Database administrator or Organizations: Yes    Attends Engineer, structural: More than 4 times per year    Marital Status: Married  Catering manager Violence: Not At Risk (12/20/2022)   Humiliation, Afraid, Rape, and Kick questionnaire    Fear of Current or  Ex-Partner: No    Emotionally Abused: No    Physically Abused: No    Sexually Abused: No    Past Surgical History:  Procedure Laterality Date   ABDOMINAL HYSTERECTOMY  1998    Family History  Problem Relation Age of Onset   High Cholesterol Mother    High blood pressure Mother    Diabetes Mother    High blood pressure Father    High Cholesterol Father    Diabetes Father    High blood pressure Brother    High Cholesterol Brother    Breast cancer Neg Hx     No Known Allergies  Current Outpatient Medications on File Prior to Visit  Medication Sig Dispense Refill   betamethasone dipropionate 0.05 % cream Apply topically 2 (two) times daily. 30 g 1   albuterol (VENTOLIN HFA) 108 (90 Base) MCG/ACT inhaler TAKE 2 PUFFS BY MOUTH EVERY 6 HOURS AS NEEDED FOR WHEEZE OR SHORTNESS OF BREATH 8.5 each 0   benztropine (COGENTIN) 1 MG tablet TAKE 1 TABLET BY MOUTH EVERY DAY 90 tablet 1   citalopram (CELEXA) 10 MG tablet Take 1 tablet (10 mg total) by mouth daily. 90 tablet 1   donepezil (ARICEPT) 10 MG tablet TAKE 1 TABLET (10 MG TOTAL) BY MOUTH EVERY MORNING. 90 tablet 0   ezetimibe (ZETIA) 10 MG tablet TAKE 1 TABLET BY MOUTH EVERY DAY 90 tablet 1   fluticasone (FLONASE) 50 MCG/ACT nasal spray SPRAY 2 SPRAYS INTO EACH NOSTRIL EVERY DAY 48 mL 2   hydrochlorothiazide (HYDRODIURIL) 25 MG tablet TAKE 1 TABLET (25 MG TOTAL) BY MOUTH DAILY. 90 tablet 1   Magnesium 250 MG TABS Take 250 mg by mouth daily as needed (cramps).     Multiple Vitamin (MULTIVITAMIN WITH MINERALS) TABS tablet Take 1 tablet by mouth every morning. ALIVE     naproxen sodium (ALEVE) 220 MG tablet Take 220-440 mg by mouth 2 (two) times daily as needed (pain).     ramipril (ALTACE) 5 MG capsule TAKE 1 CAPSULE BY MOUTH EVERY DAY 90 capsule 1   rosuvastatin (CRESTOR) 40 MG tablet TAKE 1 TABLET BY MOUTH EVERY DAY 90 tablet 1   Semaglutide-Weight Management (WEGOVY) 1.7 MG/0.75ML SOAJ Inject 1.7 mg into the skin once a week. 3 mL 2    [DISCONTINUED] escitalopram (LEXAPRO) 10 MG tablet TAKE 1 TABLET BY MOUTH EVERY DAY 90 tablet 0   No current facility-administered medications on file prior to visit.    BP 110/62   Pulse 70   Temp 98 F (36.7 C) (Oral)   Ht 5\' 7"  (  1.702 m)   Wt 162 lb (73.5 kg)   SpO2 99%   BMI 25.37 kg/m       Objective:   Physical Exam Vitals and nursing note reviewed.  Constitutional:      Appearance: Normal appearance.  Cardiovascular:     Rate and Rhythm: Normal rate and regular rhythm.     Pulses: Normal pulses.     Heart sounds: Normal heart sounds.  Pulmonary:     Effort: Pulmonary effort is normal.     Breath sounds: Normal breath sounds.  Skin:    General: Skin is warm and dry.  Neurological:     General: No focal deficit present.     Mental Status: She is alert and oriented to person, place, and time.  Psychiatric:        Mood and Affect: Mood normal.        Behavior: Behavior normal.        Thought Content: Thought content normal.        Judgment: Judgment normal.           Assessment & Plan:  1. Pre-diabetes  - POC HgB A1c- 5.6  - Will increase Wegovy to 2.4 mg  - Semaglutide-Weight Management (WEGOVY) 2.4 MG/0.75ML SOAJ; Inject 2.4 mg into the skin once a week.  Dispense: 9 mL; Refill: 0  2. Weight loss counseling, encounter for  - Semaglutide-Weight Management (WEGOVY) 2.4 MG/0.75ML SOAJ; Inject 2.4 mg into the skin once a week.  Dispense: 9 mL; Refill: 0  3. Essential hypertension - Will have her d/c HCTZ  - Monitor BP at home and let me know if trending up   Shirline Frees, NP

## 2022-12-31 NOTE — Patient Instructions (Signed)
I am going to stop one of your blood pressure medications called HCTZ - check your blood pressure at home and let me know if BP trending up   We will increase your Wegovy to 2.4 mg   Lets follow up in 3 months to see how it is going

## 2023-01-26 ENCOUNTER — Other Ambulatory Visit: Payer: Self-pay | Admitting: Adult Health

## 2023-01-27 ENCOUNTER — Other Ambulatory Visit: Payer: Self-pay | Admitting: Physician Assistant

## 2023-01-27 DIAGNOSIS — R413 Other amnesia: Secondary | ICD-10-CM

## 2023-02-09 ENCOUNTER — Other Ambulatory Visit: Payer: Self-pay | Admitting: Adult Health

## 2023-02-09 ENCOUNTER — Other Ambulatory Visit: Payer: Self-pay | Admitting: Physician Assistant

## 2023-02-09 DIAGNOSIS — R413 Other amnesia: Secondary | ICD-10-CM

## 2023-02-09 DIAGNOSIS — F419 Anxiety disorder, unspecified: Secondary | ICD-10-CM

## 2023-02-09 DIAGNOSIS — R251 Tremor, unspecified: Secondary | ICD-10-CM

## 2023-02-09 NOTE — Telephone Encounter (Signed)
Hydrochlorothiazide The original prescription was discontinued on 12/31/2022 by Shirline Frees, NP. Renewing this prescription may not be appropriate.

## 2023-03-08 ENCOUNTER — Other Ambulatory Visit: Payer: Self-pay

## 2023-03-08 ENCOUNTER — Encounter: Payer: Self-pay | Admitting: Physician Assistant

## 2023-03-08 ENCOUNTER — Ambulatory Visit: Payer: Medicare PPO | Admitting: Physician Assistant

## 2023-03-08 VITALS — BP 116/75 | HR 76 | Resp 20 | Ht 67.0 in | Wt 156.0 lb

## 2023-03-08 DIAGNOSIS — G3184 Mild cognitive impairment, so stated: Secondary | ICD-10-CM

## 2023-03-08 DIAGNOSIS — R7303 Prediabetes: Secondary | ICD-10-CM

## 2023-03-08 DIAGNOSIS — Z713 Dietary counseling and surveillance: Secondary | ICD-10-CM

## 2023-03-08 DIAGNOSIS — R413 Other amnesia: Secondary | ICD-10-CM

## 2023-03-08 MED ORDER — WEGOVY 2.4 MG/0.75ML ~~LOC~~ SOAJ: 2.4000 mg | SUBCUTANEOUS | 0 refills | Status: DC

## 2023-03-08 MED ORDER — DONEPEZIL HCL 10 MG PO TABS: 10.0000 mg | ORAL_TABLET | Freq: Every morning | ORAL | 3 refills | Status: DC

## 2023-03-08 NOTE — Patient Instructions (Addendum)
Good to see you doing well!  Continue Donepezil 10 mg daily  Referral to Neuropsychology for clarity of the diagnosis   2. Control mood as per PCP and continue group therapy   3. Follow-up in 6 months, call for any changes    RECOMMENDATIONS FOR ALL PATIENTS WITH MEMORY PROBLEMS: 1. Continue to exercise (Recommend 30 minutes of walking everyday, or 3 hours every week) 2. Increase social interactions - continue going to [place] and enjoy social gatherings with friends and family 3. Eat healthy, avoid fried foods and eat more fruits and vegetables 4. Maintain adequate blood pressure, blood sugar, and blood cholesterol level. Reducing the risk of stroke and cardiovascular disease also helps promoting better memory. 5. Avoid stressful situations. Live a simple life and avoid aggravations. Organize your time and prepare for the next day in anticipation. 6. Sleep well, avoid any interruptions of sleep and avoid any distractions in the bedroom that may interfere with adequate sleep quality 7. Avoid sugar, avoid sweets as there is a strong link between excessive sugar intake, diabetes, and cognitive impairment The Mediterranean diet has been shown to help patients reduce the risk of progressive memory disorders and reduces cardiovascular risk. This includes eating fish, eat fruits and green leafy vegetables, nuts like almonds and hazelnuts, walnuts, and also use olive oil. Avoid fast foods and fried foods as much as possible. Avoid sweets and sugar as sugar use has been linked to worsening of memory function.
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
## 2023-04-05 ENCOUNTER — Other Ambulatory Visit: Payer: Self-pay | Admitting: Adult Health

## 2023-04-05 DIAGNOSIS — R251 Tremor, unspecified: Secondary | ICD-10-CM

## 2023-04-06 NOTE — Telephone Encounter (Signed)
  The original prescription was discontinued on 12/31/2022 by Shirline Frees, NP. Renewing this prescription may not be appropriate.

## 2023-04-26 ENCOUNTER — Other Ambulatory Visit: Payer: Self-pay | Admitting: Adult Health

## 2023-04-26 DIAGNOSIS — R7303 Prediabetes: Secondary | ICD-10-CM

## 2023-04-26 DIAGNOSIS — Z713 Dietary counseling and surveillance: Secondary | ICD-10-CM

## 2023-04-28 ENCOUNTER — Other Ambulatory Visit: Payer: Self-pay | Admitting: Adult Health

## 2023-04-28 DIAGNOSIS — Z713 Dietary counseling and surveillance: Secondary | ICD-10-CM

## 2023-04-28 DIAGNOSIS — R7303 Prediabetes: Secondary | ICD-10-CM

## 2023-04-28 MED ORDER — WEGOVY 2.4 MG/0.75ML ~~LOC~~ SOAJ: 2.4000 mg | SUBCUTANEOUS | 0 refills | Status: DC

## 2023-05-11 ENCOUNTER — Other Ambulatory Visit: Payer: Self-pay | Admitting: Adult Health

## 2023-05-12 ENCOUNTER — Other Ambulatory Visit: Payer: Self-pay | Admitting: Adult Health

## 2023-05-12 DIAGNOSIS — R251 Tremor, unspecified: Secondary | ICD-10-CM

## 2023-07-26 ENCOUNTER — Other Ambulatory Visit: Payer: Self-pay | Admitting: Adult Health

## 2023-07-26 DIAGNOSIS — Z713 Dietary counseling and surveillance: Secondary | ICD-10-CM

## 2023-07-26 DIAGNOSIS — R7303 Prediabetes: Secondary | ICD-10-CM

## 2023-07-27 ENCOUNTER — Other Ambulatory Visit: Payer: Self-pay | Admitting: Adult Health

## 2023-07-27 ENCOUNTER — Encounter: Payer: Self-pay | Admitting: Adult Health

## 2023-07-27 DIAGNOSIS — Z713 Dietary counseling and surveillance: Secondary | ICD-10-CM

## 2023-07-27 DIAGNOSIS — R7303 Prediabetes: Secondary | ICD-10-CM

## 2023-07-27 MED ORDER — WEGOVY 2.4 MG/0.75ML ~~LOC~~ SOAJ: 2.4000 mg | SUBCUTANEOUS | 0 refills | Status: DC

## 2023-08-01 ENCOUNTER — Other Ambulatory Visit: Payer: Self-pay | Admitting: Adult Health

## 2023-08-01 DIAGNOSIS — Z1231 Encounter for screening mammogram for malignant neoplasm of breast: Secondary | ICD-10-CM

## 2023-08-02 ENCOUNTER — Ambulatory Visit: Payer: Medicare PPO | Admitting: Adult Health

## 2023-08-02 NOTE — Progress Notes (Deleted)
Subjective:    Patient ID: Ellen [name], female    DOB: Nov 18, 1955, 67 y.o.   MRN: [id number]  HPI  She presents to the office today for follow-up regarding prediabetes and obesity.  She is managed with Wegovy 2.4 mg weekly.  She has been tolerating this medication well with no side effects.  She is exercising on a routine basis and tries to eat healthy. She has been able to lose about 32 pounds.  Lab Results  Component Value Date   HGBA1C 5.6 12/31/2022   Wt Readings from Last 3 Encounters:  03/08/23 156 lb (70.8 kg)  12/31/22 162 lb (73.5 kg)  12/20/22 162 lb (73.5 kg)   HTN -managed with ramipril 5 mg daily and HCTZ 25 mg daily.  She has been having some dizziness and lightheadedness when changing positions. BP Readings from Last 3 Encounters:  03/08/23 116/75  12/31/22 110/62  09/13/22 137/75   Review of Systems See HPI   Past Medical History:  Diagnosis Date   Alcohol abuse    Depression    Hyperlipidemia    Hypertension    Pre-diabetes    Seasonal allergies     Social History   Socioeconomic History   Marital status: Media planner    Spouse name: Not on file   Number of children: Not on file   Years of education: 16   Highest education level: Some college, no degree  Occupational History   Not on file  Tobacco Use   Smoking status: Former   Smokeless tobacco: Never  Vaping Use   Vaping status: Never Used  Substance and Sexual Activity   Alcohol use: Not Currently   Drug use: Never   Sexual activity: Not on file  Other Topics Concern   Not on file  Social History Narrative   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, 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   Married to partner    Right handed   Drinks caffeine   Two story home      Social Drivers of Health   Financial Resource Strain: Low Risk  (08/01/2023)   Overall Financial Resource Strain (CARDIA)    Difficulty of Paying Living Expenses: Not very hard  Food Insecurity:  No Food Insecurity (08/01/2023)   Hunger Vital Sign    Worried About Running Out of Food in the Last Year: Never true    Ran Out of Food in the Last Year: Never true  Transportation Needs: No Transportation Needs (08/01/2023)   PRAPARE - Administrator, Civil Service (Medical): No    Lack of Transportation (Non-Medical): No  Physical Activity: Sufficiently Active (08/01/2023)   Exercise Vital Sign    Days of Exercise per Week: 6 days    Minutes of Exercise per Session: 90 min  Stress: Stress Concern Present (08/01/2023)   [employer] of Occupational Health - Occupational Stress Questionnaire    Feeling of Stress : To some extent  Social Connections: Socially Integrated (08/01/2023)   Social Connection and Isolation Panel [NHANES]    Frequency of Communication with Friends and Family: Once a week    Frequency of Social Gatherings with Friends and Family: Three times a week    Attends Religious Services: 1 to 4 times per year    Active Member of Clubs or Organizations: Yes    Attends Banker Meetings: More than 4 times per year    Marital Status: Married  Catering manager  Violence: Not At Risk (12/20/2022)   Humiliation, Afraid, Rape, and Kick questionnaire    Fear of Current or Ex-Partner: No    Emotionally Abused: No    Physically Abused: No    Sexually Abused: No    Past Surgical History:  Procedure Laterality Date   ABDOMINAL HYSTERECTOMY  1998    Family History  Problem Relation Age of Onset   High Cholesterol Mother    High blood pressure Mother    Diabetes Mother    High blood pressure Father    High Cholesterol Father    Diabetes Father    High blood pressure Brother    High Cholesterol Brother    Breast cancer Neg Hx     No Known Allergies  Current Outpatient Medications on File Prior to Visit  Medication Sig Dispense Refill   albuterol (VENTOLIN HFA) 108 (90 Base) MCG/ACT inhaler TAKE 2 PUFFS BY MOUTH EVERY 6 HOURS AS NEEDED FOR  WHEEZE OR SHORTNESS OF BREATH 8.5 each 0   benztropine (COGENTIN) 1 MG tablet TAKE 1 TABLET BY MOUTH EVERY DAY 90 tablet 1   betamethasone dipropionate 0.05 % cream Apply topically 2 (two) times daily. 30 g 1   citalopram (CELEXA) 10 MG tablet TAKE 1 TABLET BY MOUTH EVERY DAY 90 tablet 1   donepezil (ARICEPT) 10 MG tablet Take 1 tablet (10 mg total) by mouth every morning. 90 tablet 3   ezetimibe (ZETIA) 10 MG tablet TAKE 1 TABLET BY MOUTH EVERY DAY 90 tablet 1   fluticasone (FLONASE) 50 MCG/ACT nasal spray SPRAY 2 SPRAYS INTO EACH NOSTRIL EVERY DAY 48 mL 2   Magnesium 250 MG TABS Take 250 mg by mouth daily as needed (cramps).     Multiple Vitamin (MULTIVITAMIN WITH MINERALS) TABS tablet Take 1 tablet by mouth every morning. ALIVE     naproxen sodium (ALEVE) 220 MG tablet Take 220-440 mg by mouth 2 (two) times daily as needed (pain).     ramipril (ALTACE) 5 MG capsule TAKE 1 CAPSULE BY MOUTH EVERY DAY 90 capsule 1   rosuvastatin (CRESTOR) 40 MG tablet TAKE 1 TABLET BY MOUTH EVERY DAY 90 tablet 1   Semaglutide-Weight Management (WEGOVY) 2.4 MG/0.75ML SOAJ Inject 2.4 mg into the skin once a week. 9 mL 0   [DISCONTINUED] escitalopram (LEXAPRO) 10 MG tablet TAKE 1 TABLET BY MOUTH EVERY DAY 90 tablet 0   No current facility-administered medications on file prior to visit.    There were no vitals taken for this visit.      Objective:   Physical Exam Vitals and nursing note reviewed.  Constitutional:      Appearance: Normal appearance. She is obese.  Cardiovascular:     Rate and Rhythm: Normal rate and regular rhythm.     Pulses: Normal pulses.     Heart sounds: Normal heart sounds.  Pulmonary:     Effort: Pulmonary effort is normal.     Breath sounds: Normal breath sounds.  Skin:    General: Skin is warm and dry.  Neurological:     General: No focal deficit present.     Mental Status: She is alert and oriented to person, place, and time.  Psychiatric:        Mood and Affect: Mood  normal.        Behavior: Behavior normal.        Thought Content: Thought content normal.        Judgment: Judgment normal.  Assessment & Plan:

## 2023-08-04 ENCOUNTER — Ambulatory Visit (INDEPENDENT_AMBULATORY_CARE_PROVIDER_SITE_OTHER): Payer: Medicare PPO | Admitting: Adult Health

## 2023-08-04 ENCOUNTER — Encounter: Payer: Self-pay | Admitting: Adult Health

## 2023-08-04 ENCOUNTER — Ambulatory Visit: Payer: Medicare PPO

## 2023-08-04 VITALS — BP 150/90 | HR 63 | Temp 97.8°F | Ht 67.0 in | Wt 153.0 lb

## 2023-08-04 DIAGNOSIS — R7303 Prediabetes: Secondary | ICD-10-CM

## 2023-08-04 DIAGNOSIS — G8929 Other chronic pain: Secondary | ICD-10-CM | POA: Diagnosis not present

## 2023-08-04 DIAGNOSIS — M25461 Effusion, right knee: Secondary | ICD-10-CM | POA: Diagnosis not present

## 2023-08-04 DIAGNOSIS — M25561 Pain in right knee: Secondary | ICD-10-CM

## 2023-08-04 DIAGNOSIS — M1711 Unilateral primary osteoarthritis, right knee: Secondary | ICD-10-CM | POA: Diagnosis not present

## 2023-08-04 DIAGNOSIS — I10 Essential (primary) hypertension: Secondary | ICD-10-CM | POA: Diagnosis not present

## 2023-08-04 DIAGNOSIS — Z713 Dietary counseling and surveillance: Secondary | ICD-10-CM | POA: Diagnosis not present

## 2023-08-04 LAB — POCT GLYCOSYLATED HEMOGLOBIN (HGB A1C): Hemoglobin A1C: 5.7 % — AB (ref 4.0–5.6)

## 2023-08-04 MED ORDER — RAMIPRIL 5 MG PO CAPS: 5.0000 mg | ORAL_CAPSULE | Freq: Every day | ORAL | 3 refills | Status: DC

## 2023-08-04 NOTE — Progress Notes (Signed)
Subjective:    Patient ID: Ellen [name], female    DOB: 04-16-1956, 67 y.o.   MRN: [id number]  HPI  She presents to the office today for follow-up regarding prediabetes/HTN and obesity.  She is managed with Wegovy 2.4 mg weekly.  She has been tolerating this medication well with no side effects.  She is exercising on a routine basis and tries to eat healthy. She has been able to lose about 32 pounds.  Lab Results  Component Value Date   HGBA1C 5.7 (A) 08/04/2023   HGBA1C 5.6 12/31/2022   HGBA1C 6.4 09/01/2022   Wt Readings from Last 3 Encounters:  08/04/23 153 lb (69.4 kg)  03/08/23 156 lb (70.8 kg)  12/31/22 162 lb (73.5 kg)   HTN -managed with ramipril 5 mg daily. She has been out of this medication for the last few months BP Readings from Last 3 Encounters:  08/04/23 (!) 150/90  03/08/23 116/75  12/31/22 110/62    Chronic right knee pain - she has had steroid injections in the past, last being in April 2023. She has done well with steroid injections and just recently started having more pain with certain movements. She does want to do an xray today before having another steroid injection. She has not noticed any swelling, redness or warmth.     Review of Systems See HPI   Past Medical History:  Diagnosis Date   Alcohol abuse    Depression    Hyperlipidemia    Hypertension    Pre-diabetes    Seasonal allergies     Social History   Socioeconomic History   Marital status: Media planner    Spouse name: Not on file   Number of children: Not on file   Years of education: 16   Highest education level: Some college, no degree  Occupational History   Not on file  Tobacco Use   Smoking status: Former   Smokeless tobacco: Never  Vaping Use   Vaping status: Never Used  Substance and Sexual Activity   Alcohol use: Not Currently   Drug use: Never   Sexual activity: Not on file  Other Topics Concern   Not on file  Social History Narrative   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, 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   Married to partner    Right handed   Drinks caffeine   Two story home      Social Drivers of Health   Financial Resource Strain: Low Risk  (08/01/2023)   Overall Financial Resource Strain (CARDIA)    Difficulty of Paying Living Expenses: Not very hard  Food Insecurity: No Food Insecurity (08/01/2023)   Hunger Vital Sign    Worried About Running Out of Food in the Last Year: Never true    Ran Out of Food in the Last Year: Never true  Transportation Needs: No Transportation Needs (08/01/2023)   PRAPARE - Administrator, Civil Service (Medical): No    Lack of Transportation (Non-Medical): No  Physical Activity: Sufficiently Active (08/01/2023)   Exercise Vital Sign    Days of Exercise per Week: 6 days    Minutes of Exercise per Session: 90 min  Stress: Stress Concern Present (08/01/2023)   [employer] of Occupational Health - Occupational Stress Questionnaire    Feeling of Stress : To some extent  Social Connections: Socially Integrated (08/01/2023)   Social Connection and Isolation Panel [NHANES]    Frequency  of Communication with Friends and Family: Once a week    Frequency of Social Gatherings with Friends and Family: Three times a week    Attends Religious Services: 1 to 4 times per year    Active Member of Clubs or Organizations: Yes    Attends Banker Meetings: More than 4 times per year    Marital Status: Married  Catering manager Violence: Not At Risk (12/20/2022)   Humiliation, Afraid, Rape, and Kick questionnaire    Fear of Current or Ex-Partner: No    Emotionally Abused: No    Physically Abused: No    Sexually Abused: No    Past Surgical History:  Procedure Laterality Date   ABDOMINAL HYSTERECTOMY  1998    Family History  Problem Relation Age of Onset   High Cholesterol Mother    High blood pressure Mother    Diabetes Mother    High blood pressure  Father    High Cholesterol Father    Diabetes Father    High blood pressure Brother    High Cholesterol Brother    Breast cancer Neg Hx     No Known Allergies  Current Outpatient Medications on File Prior to Visit  Medication Sig Dispense Refill   albuterol (VENTOLIN HFA) 108 (90 Base) MCG/ACT inhaler TAKE 2 PUFFS BY MOUTH EVERY 6 HOURS AS NEEDED FOR WHEEZE OR SHORTNESS OF BREATH 8.5 each 0   benztropine (COGENTIN) 1 MG tablet TAKE 1 TABLET BY MOUTH EVERY DAY 90 tablet 1   betamethasone dipropionate 0.05 % cream Apply topically 2 (two) times daily. 30 g 1   citalopram (CELEXA) 10 MG tablet TAKE 1 TABLET BY MOUTH EVERY DAY 90 tablet 1   donepezil (ARICEPT) 10 MG tablet Take 1 tablet (10 mg total) by mouth every morning. 90 tablet 3   ezetimibe (ZETIA) 10 MG tablet TAKE 1 TABLET BY MOUTH EVERY DAY 90 tablet 1   fluticasone (FLONASE) 50 MCG/ACT nasal spray SPRAY 2 SPRAYS INTO EACH NOSTRIL EVERY DAY 48 mL 2   Magnesium 250 MG TABS Take 250 mg by mouth daily as needed (cramps).     Multiple Vitamin (MULTIVITAMIN WITH MINERALS) TABS tablet Take 1 tablet by mouth every morning. ALIVE     naproxen sodium (ALEVE) 220 MG tablet Take 220-440 mg by mouth 2 (two) times daily as needed (pain).     ramipril (ALTACE) 5 MG capsule TAKE 1 CAPSULE BY MOUTH EVERY DAY 90 capsule 1   rosuvastatin (CRESTOR) 40 MG tablet TAKE 1 TABLET BY MOUTH EVERY DAY 90 tablet 1   Semaglutide-Weight Management (WEGOVY) 2.4 MG/0.75ML SOAJ Inject 2.4 mg into the skin once a week. 9 mL 0   [DISCONTINUED] escitalopram (LEXAPRO) 10 MG tablet TAKE 1 TABLET BY MOUTH EVERY DAY (Patient not taking: Reported on 08/04/2023) 90 tablet 0   No current facility-administered medications on file prior to visit.    BP (!) 150/90   Pulse 63   Temp 97.8 F (36.6 C) (Oral)   Ht 5\' 7"  (1.702 m)   Wt 153 lb (69.4 kg)   SpO2 98%   BMI 23.96 kg/m       Objective:   Physical Exam Vitals and nursing note reviewed.  Constitutional:       Appearance: Normal appearance.  Cardiovascular:     Rate and Rhythm: Normal rate and regular rhythm.     Pulses: Normal pulses.     Heart sounds: Normal heart sounds.  Pulmonary:     Effort:  Pulmonary effort is normal.     Breath sounds: Normal breath sounds.  Musculoskeletal:        General: No swelling, tenderness or deformity. Normal range of motion.  Skin:    General: Skin is warm and dry.  Neurological:     General: No focal deficit present.     Mental Status: She is alert and oriented to person, place, and time.  Psychiatric:        Mood and Affect: Mood normal.        Behavior: Behavior normal.        Thought Content: Thought content normal.        Judgment: Judgment normal.       Assessment & Plan:   1. Pre-diabetes (Primary)  - POC HgB A1c- 5.7   2. Weight loss counseling, encounter for - Continue with Wegovy, diet and exercise  3. Essential hypertension - elevated today due to not having medication. Will restart her on ramipril 5 mg daily.  - ramipril (ALTACE) 5 MG capsule; Take 1 capsule (5 mg total) by mouth daily.  Dispense: 90 capsule; Refill: 3  4. Chronic pain of right knee  - DG Knee 1-2 Views Right; Future

## 2023-08-04 NOTE — Patient Instructions (Signed)
Health Maintenance Due  Topic Date Due   DTaP/Tdap/Td (1 - Tdap) Never done   Zoster Vaccines- Shingrix (1 of 2) Never done   INFLUENZA VACCINE  03/17/2023   COVID-19 Vaccine (6 - 2024-25 season) 04/17/2023       12/31/2022    9:25 AM 12/20/2022   11:25 AM 12/16/2021   10:48 AM  Depression screen PHQ 2/9  Decreased Interest 0 0 0  Down, Depressed, Hopeless 0 0 0  PHQ - 2 Score 0 0 0  Altered sleeping 0    Tired, decreased energy 0    Change in appetite 0    Feeling bad or failure about yourself  0    Trouble concentrating 0    Moving slowly or fidgety/restless 0    Suicidal thoughts 0    PHQ-9 Score 0    Difficult doing work/chores Not difficult at all

## 2023-08-10 ENCOUNTER — Other Ambulatory Visit: Payer: Self-pay | Admitting: Adult Health

## 2023-08-10 DIAGNOSIS — F419 Anxiety disorder, unspecified: Secondary | ICD-10-CM

## 2023-08-22 ENCOUNTER — Encounter: Payer: Self-pay | Admitting: Adult Health

## 2023-08-24 ENCOUNTER — Encounter: Payer: Self-pay | Admitting: Adult Health

## 2023-08-30 ENCOUNTER — Encounter: Payer: Self-pay | Admitting: Psychology

## 2023-08-30 DIAGNOSIS — J302 Other seasonal allergic rhinitis: Secondary | ICD-10-CM | POA: Insufficient documentation

## 2023-08-30 DIAGNOSIS — F329 Major depressive disorder, single episode, unspecified: Secondary | ICD-10-CM | POA: Insufficient documentation

## 2023-08-30 DIAGNOSIS — R7303 Prediabetes: Secondary | ICD-10-CM | POA: Insufficient documentation

## 2023-08-31 ENCOUNTER — Encounter: Payer: Self-pay | Admitting: Psychology

## 2023-08-31 ENCOUNTER — Ambulatory Visit: Payer: Medicare PPO

## 2023-08-31 ENCOUNTER — Ambulatory Visit: Payer: Medicare PPO | Admitting: Psychology

## 2023-08-31 DIAGNOSIS — F1021 Alcohol dependence, in remission: Secondary | ICD-10-CM

## 2023-08-31 DIAGNOSIS — R4189 Other symptoms and signs involving cognitive functions and awareness: Secondary | ICD-10-CM

## 2023-08-31 DIAGNOSIS — G3184 Mild cognitive impairment, so stated: Secondary | ICD-10-CM | POA: Diagnosis not present

## 2023-08-31 NOTE — Progress Notes (Signed)
 NEUROPSYCHOLOGICAL EVALUATION [place]. [facility] Department of Neurology  Date of Evaluation: August 31, 2023  Reason for Referral:   Ellen [name] is a 68 y.o. right-handed Caucasian female referred by Tex Filbert, PA-C, to characterize her current cognitive functioning and assist with diagnostic clarity and treatment planning in the context of a prior mild neurocognitive disorder diagnosis and concerns for progressive cognitive decline.   Assessment and Plan:   Clinical Impression(s): [name] pattern of performance is suggestive of significant impairment surrounding visuoconstructional abilities (i.e., clock drawing, figure copy) and encoding (i.e., learning) aspects of memory. Further performance variability was exhibited across processing speed, attention/concentration, executive functioning, and both delayed retrieval and recognition/consolidation aspects of memory. Performances were appropriate relative to age-matched peers across receptive and expressive language, as well as visuospatial tasks. [name] denied difficulties completing instrumental activities of daily living (ADLs) independently. As such, given evidence for cognitive dysfunction described above, she continues to best meet diagnostic criteria for a Mild Neurocognitive Disorder ("mild cognitive impairment") presently.  Relative to her previous October 2022 evaluation, decline was exhibited across visuoconstructional abilities (figure copy in particular), encoding aspects of story-based content, and delayed retrieval of a previously learned list of words. Outside of this, other assessed domains where direct comparisons were possible exhibited relative stability.   The etiology for ongoing cognitive dysfunction remains unclear. Current testing patterns are certainly reasonable given her very lengthy history of prominent alcohol abuse and dependence. However, mild declines across various  areas across the evaluation are curious as one would expect relative stability given that [name] has reportedly maintained complete sobriety for several years at this point. As her most recent brain MRI was performed in April 2022, there remains the potential for progression of previously identified microvascular ischemic disease or other anatomical changes which could influence cognitive decline. However, this is purely speculative at this time. She did report increased anxiety severity relative to previous testing. This may have negatively influenced current testing patterns.   Despite some mild decline, current testing patterns do not elevate concerns for an underlying neurodegenerative illness at the present time. Despite 0% retention after a brief delay across a list learning task, retention rates were adequate across story and figure tasks. Memory consolidation performances were also adequate across all tasks. Overall, current testing does not suggest the presence of rapid forgetting or a pronounced storage impairment in a compelling fashion, making symptomatic Alzheimer's disease less likely. She also does not display behavioral characteristics worrisome for Lewy body disease, another more rare parkinsonian condition, or frontotemporal lobar degeneration at the present time. Continued medical monitoring will be important moving forward.   Recommendations: An updated brain MRI would be beneficial to better determine any anatomical correlates for mild cognitive decline. She should discuss this with Ellen [name].   A repeat neuropsychological evaluation in 18-24 months (or sooner if functional decline is noted) could be considered to assess the trajectory of future cognitive decline should it occur.  A combination of medication and psychotherapy has been shown to be most effective at treating symptoms of anxiety and depression. As such, [name] is encouraged to speak with her prescribing physician  regarding medication adjustments to optimally manage these symptoms. Likewise, [name] is encouraged to consider engaging in short-term psychotherapy to address symptoms of psychiatric distress. She would benefit from an active and collaborative therapeutic environment, rather than one purely supportive in nature. Recommended treatment modalities include Cognitive Behavioral Therapy (CBT) or Acceptance and Commitment Therapy (  ACT).  Performance across neurocognitive testing is not a strong predictor of an individual's safety operating a motor vehicle. Should she or her family wish to pursue a formalized driving evaluation, they could reach out to the following agencies: [facility] [employer] [facility] in [place]: [phone] [facility]: [phone] [facility]: [phone] [facility]: [phone] or [phone]  It will be important for [name] to have another person with her when in situations where she may need to process information, weigh the pros and cons of different options, and make decisions, in order to ensure that she fully understands and recalls all information to be considered.  [name] is encouraged to attend to lifestyle factors for brain health (e.g., regular physical exercise, good nutrition habits and consideration of the MIND-DASH diet, regular participation in cognitively-stimulating activities, and general stress management techniques), which are likely to have benefits for both emotional adjustment and cognition. In fact, in addition to promoting good general health, regular exercise incorporating aerobic activities (e.g., brisk walking, jogging, cycling, etc.) has been demonstrated to be a very effective treatment for depression and stress, with similar efficacy rates to both antidepressant medication and psychotherapy. Optimal control of vascular risk factors (including safe cardiovascular exercise and adherence to dietary recommendations)  is encouraged. Continued full abstinence from alcohol is strongly encouraged. Continued participation in activities which provide mental stimulation and social interaction is also recommended.   Memory can be improved using internal strategies such as rehearsal, repetition, chunking, mnemonics, association, and imagery. External strategies such as written notes in a consistently used memory journal, visual and nonverbal auditory cues such as a calendar on the refrigerator or appointments with alarm, such as on a cell phone, can also help maximize recall.    When learning new information, she would benefit from information being broken up into small, manageable pieces. She may also find it helpful to articulate the material in her own words and in a context to promote encoding at the onset of a new task. This material may need to be repeated multiple times to promote encoding. Because she shows better recall for structured information, she will likely understand and retain new information better if it is presented to her in a meaningful or well-organized manner at the outset, such as grouping items into meaningful categories or presenting information in an outlined, bulleted, or story format.  To address problems with processing speed, she may wish to consider:   -Ensuring that she is alerted when essential material or instructions are being presented   -Adjusting the speed at which new information is presented   -Allowing for more time in comprehending, processing, and responding in conversation   -Repeating and paraphrasing instructions or conversations aloud  To address problems with fluctuating attention and/or executive dysfunction, she may wish to consider:   -Avoiding external distractions when needing to concentrate   -Limiting exposure to fast paced environments with multiple sensory demands   -Writing down complicated information and using checklists   -Attempting and completing one task at  a time (i.e., no multi-tasking)   -Verbalizing aloud each step of a task to maintain focus   -Taking frequent breaks during the completion of steps/tasks to avoid fatigue   -Reducing the amount of information considered at one time   -Scheduling more difficult activities for a time of day where she is usually most alert  Review of Records:   Ms. Dibiase completed a comprehensive neuropsychological evaluation Elmer Hackney, Psy.D.) on 05/25/2021. Results suggested "some level of  cognitive decline although the extent of the patient's difficulties appears to be quite mild. Primary difficulties appear to be with processing of visual information and also with aspects of working memory. These deficits could certainly be related to her history of alcohol use (and in fact visuospatial processing problems are quite classic for this etiology) although they may also be related to anticholinergic medications or other factors. She does not appear to have any findings concerning for expected pattern in Wernicke-Korsakoff (I.e., amnestic memory problems) or in progressive causes such as AD. Recommend continued abstinence from alcohol, engaging in exercise, eating a healthy diet, and other lifestyle changes for primary prevention."  Ms. Hulette has been followed by [facility] over time, most recently meeting with Ms. Tex Filbert, PA-C, on 03/08/2023. Cognition was said to be stable at that time. She noted some increased distractibility and the use of various compensatory strategies. Sobriety from alcohol has been maintained for the past several years. ADL dysfunction was denied. Performance on a brief cognitive screening instrument (MMSE) was 30/30. Ultimately, Ms. Legacy was referred for a repeat neuropsychological evaluation to characterize her cognitive abilities and to assist with diagnostic clarity and treatment planning.   Neuroimaging: Brain MRI on 06/15/2019 was unremarkable outside of mild microvascular  ischemic disease. Brain MRI on 11/21/2020 was stable.  Past Medical History:  Diagnosis Date   Alcohol dependence in sustained full remission 05/28/2021   Generalized anxiety disorder    Hyperlipidemia    Hypertension    Insomnia 03/12/2020   Major depressive disorder    Mild cognitive impairment 06/08/2021   Pre-diabetes    Seasonal allergies    Suicidal overdose 01/20/2021   Tremors of nervous system 03/12/2020    Past Surgical History:  Procedure Laterality Date   ABDOMINAL HYSTERECTOMY  1998    Current Outpatient Medications:    albuterol  (VENTOLIN  HFA) 108 (90 Base) MCG/ACT inhaler, TAKE 2 PUFFS BY MOUTH EVERY 6 HOURS AS NEEDED FOR WHEEZE OR SHORTNESS OF BREATH, Disp: 8.5 each, Rfl: 0   benztropine  (COGENTIN ) 1 MG tablet, TAKE 1 TABLET BY MOUTH EVERY DAY, Disp: 90 tablet, Rfl: 1   betamethasone  dipropionate 0.05 % cream, Apply topically 2 (two) times daily., Disp: 30 g, Rfl: 1   citalopram  (CELEXA ) 10 MG tablet, TAKE 1 TABLET BY MOUTH EVERY DAY, Disp: 90 tablet, Rfl: 1   donepezil  (ARICEPT ) 10 MG tablet, Take 1 tablet (10 mg total) by mouth every morning., Disp: 90 tablet, Rfl: 3   ezetimibe  (ZETIA ) 10 MG tablet, TAKE 1 TABLET BY MOUTH EVERY DAY, Disp: 90 tablet, Rfl: 1   fluticasone  (FLONASE ) 50 MCG/ACT nasal spray, SPRAY 2 SPRAYS INTO EACH NOSTRIL EVERY DAY, Disp: 48 mL, Rfl: 2   Magnesium 250 MG TABS, Take 250 mg by mouth daily as needed (cramps)., Disp: , Rfl:    Multiple Vitamin (MULTIVITAMIN WITH MINERALS) TABS tablet, Take 1 tablet by mouth every morning. ALIVE, Disp: , Rfl:    naproxen sodium (ALEVE) 220 MG tablet, Take 220-440 mg by mouth 2 (two) times daily as needed (pain)., Disp: , Rfl:    ramipril  (ALTACE ) 5 MG capsule, Take 1 capsule (5 mg total) by mouth daily., Disp: 90 capsule, Rfl: 3   rosuvastatin  (CRESTOR ) 40 MG tablet, TAKE 1 TABLET BY MOUTH EVERY DAY, Disp: 90 tablet, Rfl: 1   Semaglutide -Weight Management (WEGOVY ) 2.4 MG/0.75ML SOAJ, Inject 2.4 mg into the  skin once a week., Disp: 9 mL, Rfl: 0  Clinical Interview:   The following information was obtained  during a clinical interview with Ms. Montezuma prior to cognitive testing.  Cognitive Symptoms: Decreased short-term memory: Endorsed. She described herself as "a little forgetful," noting that she may be prone to having some things slip through the cracks. She highlighted trouble with name recollection, as well as generalized short-term memory concerns. Since her October 2022 evaluation, she reported her perception of mildly worsening memory abilities.  Decreased long-term memory: Denied. Decreased attention/concentration: Denied. Reduced processing speed: Denied. Difficulties with executive functions: Endorsed. She reported "every once in a while" having trouble with multi-tasking, problem solving, and organization. This largely occurs when something knocks her out of her routine and she has trouble getting back on track. She denied trouble with impulsivity or any significant personality changes.  Difficulties with emotion regulation: Denied. Difficulties with receptive language: Denied. Difficulties with word finding: Denied. Decreased visuoperceptual ability: Denied.  Difficulties completing ADLs: Denied.  Additional Medical History: History of traumatic brain injury/concussion: Denied. History of stroke: Denied. History of seizure activity: Denied. History of known exposure to toxins: Denied. Symptoms of chronic pain: Denied. Experience of frequent headaches/migraines: Denied. Frequent instances of dizziness/vertigo: Denied. Previously, she reported some dizziness but stated that this had been improved since working with her PCP and lessening the dose of a blood pressure medication.   Sensory changes: Denied.  Balance/coordination difficulties: Denied. She also denied any recent falls. Other motor difficulties: Denied.  Sleep History: Estimated hours obtained each night: 8 hours.   Difficulties falling asleep: Denied. Difficulties staying asleep: Denied. Feels rested and refreshed upon awakening: Endorsed.  History of snoring: Denied. History of waking up gasping for air: Denied. Witnessed breath cessation while asleep: Denied.  History of vivid dreaming: Denied. Excessive movement while asleep: Denied. Instances of acting out her dreams: Denied.  Psychiatric/Behavioral Health History: Depression: She acknowledged a chronic history of major depressive disorder, likely intertwined with significant alcohol abuse/dependence throughout much of her life. Medical records suggest a prior suicide attempt via medication overdose (Trazodone ) in June 2022. After this event, she became active in psychiatric care and has been followed by behavioral health. Currently, she acknowledged having good and bad days but expressed her belief that "I can handle" current symptoms. Medications over time have been helpful in providing assistance with symptom management. Current suicidal ideation, intent, or plan was denied.  Anxiety: She acknowledged a history of generalized anxious distress. In the context of the current evaluation, she highlighted that she has never been a great test taker and did express some acute anxiety surrounding upcoming testing procedures.  Mania: Denied. Trauma History: Denied. Visual/auditory hallucinations: Records suggest a remote history of visual hallucinations (e.g., people in her house and closet, as well as one instance where she thought that someone was in her car). These may have been medication induced as she felt that they were occurring during a period of time where she was taking too much of her Trazodone  medication. No recent experiences were described.  Delusional thoughts: Denied.  Tobacco: Denied. Alcohol: Records suggest a very long history of prominent alcohol abuse for a majority of her life. Around 2019, she made the decision to diminish alcohol  consumption and ultimately completed a 30-day rehabilitation treatment program in [place] . She has more or less maintained her sobriety since that time and has remained active in [employer] Currently, she reported continued sobriety.  Recreational drugs: Denied.  Family History: Problem Relation Age of Onset   High Cholesterol Mother    High blood pressure Mother    Diabetes Mother  High blood pressure Father    High Cholesterol Father    Diabetes Father    Dementia Father    High blood pressure Brother    High Cholesterol Brother    Breast cancer Neg Hx    This information was confirmed by Ms. Norberta Beans.  Academic/Vocational History: Highest level of educational attainment: 14 years. She graduated from high school and earned an Associate's degree. She described herself as a strong (A) student in academic settings. No relative weaknesses were identified.  History of developmental delay: Denied. History of grade repetition: Denied. Enrollment in special education courses: Denied. History of LD/ADHD: Denied.  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 She denied any current performance issues in this role.   Evaluation Results:   Behavioral Observations: Ms. Elsberry was unaccompanied, arrived to her appointment on time, and was appropriately dressed and groomed. She appeared alert. Observed gait and station were within normal limits. Gross motor functioning appeared intact upon informal observation and no abnormal movements (e.g., tremors) were noted. Her affect was generally relaxed and positive. Spontaneous speech was fluent and word finding difficulties were not observed during the clinical interview. Thought processes were coherent, organized, and normal in content. Insight into her cognitive difficulties appeared adequate.   During testing, sustained attention was  appropriate. Task engagement was adequate and she persisted when challenged. Overall, Ms. Roccia was cooperative with the clinical interview and subsequent testing procedures.   Adequacy of Effort: The validity of neuropsychological testing is limited by the extent to which the individual being tested may be assumed to have exerted adequate effort during testing. Ms. Grindstaff expressed her intention to perform to the best of her abilities and exhibited adequate task engagement and persistence. Scores across stand-alone and embedded performance validity measures were within expectation. As such, the results of the current evaluation are believed to be a valid representation of Ms. Fisk current cognitive functioning.  Test Results: Ms. Saffold was fully oriented at the time of the current evaluation.  Intellectual abilities based upon educational and vocational attainment were estimated to be in the average range. Premorbid abilities were estimated to be within the average range based upon a single-word reading test.   Processing speed was variable, ranging from the exceptionally low to above average normative ranges. Basic attention was average. More complex attention (e.g., working memory) was variable, ranging from the exceptionally low to average normative ranges. Executive functioning was variable, ranging from the exceptionally low to average normative ranges.  While not directly assessed, receptive language abilities were believed to be intact. Likewise, Ms. Hinote did not exhibit any difficulties comprehending task instructions and answered all questions asked of her appropriately. Assessed expressive language (e.g., verbal fluency and confrontation naming) was mildly variable but overall appropriate, ranging from the below average to above average normative ranges.     Assessed visuospatial/visuoconstructional abilities were variable, ranging from the exceptionally low to average normative ranges.  When drawing her clock, she did not place numbers 1-4, included 5 where 1 would typically be, and included 10 and 15 where 2 and 3 would typically be placed. There was also no size differentiation between clock hands. Across her copy of a complex figure, she exhibited a few milder distortions and fully omitted two internal aspects.     Learning (i.e., encoding) of novel verbal information was exceptionally low to well below average. Spontaneous delayed recall (i.e., retrieval) of previously learned information was  exceptionally low across a list learning task but below average to average across figure and story-based tasks. Retention rates were 0% across a list learning task, 113% across a story learning task, and 62% across a figure drawing task. Performance across recognition tasks was well below average across a list learning task but average across all other tasks, suggesting some evidence for information consolidation.   Results of emotional screening instruments suggested that recent symptoms of generalized anxiety were in the moderate range, while symptoms of depression were within normal limits. A screening instrument assessing recent sleep quality suggested the presence of minimal sleep dysfunction.  Tables of Scores:   Note: This summary of test scores accompanies the interpretive report and should not be considered in isolation without reference to the appropriate sections in the text. Descriptors are based on appropriate normative data and may be adjusted based on clinical judgment. Terms such as "Within Normal Limits" and "Outside Normal Limits" are used when a more specific description of the test score cannot be determined. Descriptors refer to the current evaluation only.         Percentile - Normative Descriptor > 98 - Exceptionally High 91-97 - Well Above Average 75-90 - Above Average 25-74 - Average 9-24 - Below Average 2-8 - Well Below Average < 2 - Exceptionally Low         Validity: October 2022 Current  DESCRIPTOR        ACS WC: --- --- --- Within Normal Limits  DCT: --- --- --- Within Normal Limits  RBANS EI: --- --- --- Within Normal Limits  WAIS-IV RDS: --- --- --- Within Normal Limits        Orientation:       Raw Score Raw Score Percentile   NAB Orientation, Form 1 --- 29/29 --- ---        Cognitive Screening:       Raw Score Raw Score Percentile   SLUMS: --- 19/30 --- ---        RBANS, Form A: Standard Score/ Scaled Score Standard Score/ Scaled Score Percentile   Total Score 90 72 3 Well Below Average  Immediate Memory 81 61 <1 Exceptionally Low    List Learning 4 3 1  Exceptionally Low    Story Memory 9 4 2  Well Below Average  Visuospatial/Constructional 92 69 2 Well Below Average    Figure Copy 10 3 1  Exceptionally Low    Line Orientation 15/20 14/20 17-25 Below Average to  Average  Language 96 92 30 Average    Picture Naming 9/10 10/10 51-75 Average    Semantic Fluency 8 7 16  Below Average  Attention 112 94 34 Average    Digit Span 16 11 63 Average    Coding 8 7 16  Below Average  Delayed Memory 84 75 5 Well Below Average    List Recall 4/10 0/10 <2 Exceptionally Low    List Recognition 18/20 17/20 3-9 Well Below Average    Story Recall 9 10 50 Average    Story Recognition --- 12/12 69+ Average    Figure Recall 8 6 9  Below Average    Figure Recognition --- 8/8 70+ Average         Intellectual Functioning:       Standard Score Standard Score Percentile   Test of Premorbid Functioning: --- 108 70 Average        Attention/Executive Function:      Trail Making Test (TMT): T Score Raw Score (T Score) Percentile  Part A 44 47 secs.,  0 errors (38) 12 Below Average    Part B 34 159 secs.,  2 errors (31) 3 Well Below Average          Scaled Score Scaled Score Percentile   WAIS-IV Coding: 7 --- --- ---  WAIS-IV Symbol Search: 1 2 <1 Exceptionally Low         Standard Score Standard Score Percentile   WAIS-IV Working Memory  Index: 74 77 6 Well Below Average         Scaled Score Scaled Score Percentile   WAIS-IV Arithmetic: 5 5 5  Well Below Average  WAIS-IV Digit Span: 6 7 16  Below Average    Forward 11 10 50 Average    Backward 5 9 37 Average    Sequencing 3 3 1  Exceptionally Low         Scaled Score Scaled Score Percentile   WAIS-IV Similarities: --- 10 50 Average        D-KEFS Color-Word Interference Test: Raw Score (Scaled Score) Raw Score (Scaled Score) Percentile     Color Naming --- 38 secs. (8) 25 Average    Word Reading --- 20 secs. (12) 75 Above Average    Inhibition --- 61 secs. (11) 63 Average      Total Errors --- 0 errors (12) 75 Above Average    Inhibition/Switching --- 60 secs. (12) 75 Above Average      Total Errors --- 6 errors (7) 16 Below Average        D-KEFS Verbal Fluency Test: Raw Score (Scaled Score) Raw Score (Scaled Score) Percentile     Letter Total Correct --- 50 (14) 91 Well Above Average    Category Total Correct --- 32 (9) 37 Average    Category Switching Total Correct --- 7 (3) 1 Exceptionally Low    Category Switching Accuracy --- 5 (4) 2 Well Below Average      Total Set Loss Errors --- 7 (4) 2 Well Below Average      Total Repetition Errors --- 3 (10) 50 Average        Language:      Verbal Fluency Test: T Score Raw Score (T Score) Percentile     Phonemic Fluency (FAS) 64 50 (59) 82 Above Average    Animal Fluency 44 18 (46) 34 Average         NAB Language Module, Form 1: T Score T Score Percentile     Naming 31/31 (58) 30/31 (56) 73 Average        Visuospatial/Visuoconstruction:       Raw Score Raw Score Percentile   Clock Drawing: --- 5/10 --- Impaired         Scaled Score Scaled Score Percentile   WAIS-IV Block Design: --- 8 25 Average        Mood and Personality:       Raw Score Raw Score Percentile   Beck Depression Inventory - II: --- 12 --- Within Normal Limits  PROMIS Anxiety Questionnaire: --- 22 --- Moderate        Additional  Questionnaires:       Raw Score Raw Score Percentile   PROMIS Sleep Disturbance Questionnaire: --- 18 --- None to Slight   Informed Consent and Coding/Compliance:   The current evaluation represents a clinical evaluation for the purposes previously outlined by the referral source and is in no way reflective of a forensic evaluation.   Ms. Stradling was provided with a verbal description of  the nature and purpose of the present neuropsychological evaluation. Also reviewed were the foreseeable risks and/or discomforts and benefits of the procedure, limits of confidentiality, and mandatory reporting requirements of this provider. The patient was given the opportunity to ask questions and receive answers about the evaluation. Oral consent to participate was provided by the patient.   This evaluation was conducted by Melville Stade, Ph.D., ABPP-CN, board certified clinical neuropsychologist. Ms. Longwell completed a clinical interview with Dr. Kitty Perkins, billed as one unit [phone], and 120 minutes of cognitive testing and scoring, billed as one unit [id number] and three additional units [id number]. Psychometrist Arnulfo Larch, B.S. assisted Dr. Kitty Perkins with test administration and scoring procedures. As a separate and discrete service, one unit [id number] and two units [phone] were billed for Dr. Arsenio Larger time spent in interpretation and report writing.

## 2023-08-31 NOTE — Progress Notes (Signed)
   Psychometrician Note   Cognitive testing was administered to Ellen [name] by Arnulfo Larch, B.S. (psychometrist) under the supervision of Dr. Melville Stade, Ph.D., licensed psychologist on 08/31/2023. Ms. Cleghorn did not appear overtly distressed by the testing session per behavioral observation or responses across self-report questionnaires. Rest breaks were offered.    The battery of tests administered was selected by Dr. Melville Stade, Ph.D. with consideration to Ms. Eickhoff current level of functioning, the nature of her symptoms, emotional and behavioral responses during interview, level of literacy, observed level of motivation/effort, and the nature of the referral question. This battery was communicated to the psychometrist. Communication between Dr. Melville Stade, Ph.D. and the psychometrist was ongoing throughout the evaluation and Dr. Melville Stade, Ph.D. was immediately accessible at all times. Dr. Melville Stade, Ph.D. provided supervision to the psychometrist on the date of this service to the extent necessary to assure the quality of all services provided.    Ashyla Mullings will return within approximately 1-2 weeks for an interactive feedback session with Dr. Kitty Perkins at which time her test performances, clinical impressions, and treatment recommendations will be reviewed in detail. Ms. Helsel understands she can contact our office should she require our assistance before this time.  A total of 120 minutes of billable time were spent face-to-face with Ms. Junior by the psychometrist. This includes both test administration and scoring time. Billing for these services is reflected in the clinical report generated by Dr. Melville Stade, Ph.D.  This note reflects time spent with the psychometrician and does not include test scores or any clinical interpretations made by Dr. Kitty Perkins. The full report will follow in a separate note.

## 2023-09-01 ENCOUNTER — Encounter: Payer: Self-pay | Admitting: Adult Health

## 2023-09-01 ENCOUNTER — Ambulatory Visit (INDEPENDENT_AMBULATORY_CARE_PROVIDER_SITE_OTHER): Payer: Medicare PPO | Admitting: Adult Health

## 2023-09-01 VITALS — BP 120/80 | HR 65 | Temp 98.3°F | Ht 67.0 in | Wt 153.0 lb

## 2023-09-01 DIAGNOSIS — M25561 Pain in right knee: Secondary | ICD-10-CM

## 2023-09-01 DIAGNOSIS — G8929 Other chronic pain: Secondary | ICD-10-CM | POA: Diagnosis not present

## 2023-09-01 MED ORDER — METHYLPREDNISOLONE ACETATE 80 MG/ML IJ SUSP
80.0000 mg | Freq: Once | INTRAMUSCULAR | Status: AC
  Administered 2023-09-01: 80 mg via INTRA_ARTICULAR

## 2023-09-01 NOTE — Progress Notes (Signed)
Subjective:    Patient ID: Ellen [name], female    DOB: 04-27-1956, 68 y.o.   MRN: [id number]  HPI 68 year old female who  has a past medical history of Alcohol dependence in sustained full remission (05/28/2021), Generalized anxiety disorder, Hyperlipidemia, Hypertension, Insomnia (03/12/2020), Major depressive disorder, Mild cognitive impairment (06/08/2021), Pre-diabetes, Seasonal allergies, Suicidal overdose (01/20/2021), and Tremors of nervous system (03/12/2020).  She presents to the office today for chronic right knee pain. She has had steroid injections in the past, with her last being in 12/2021. In December 2024 she reported that her pain had just started to come from the last steroid injection. Pain was not constant and did not interfere with exercise or ambulation. Pain is more pronounced with side stepping.   Xray in December showed  IMPRESSION: Mild degenerative changes. Chondrocalcinosis. Joint effusion. Please correlate with clinical presentation for etiology of the effusion as there is a differential.  She would like to have another steroid injection today    Review of Systems See HPI   Past Medical History:  Diagnosis Date   Alcohol dependence in sustained full remission 05/28/2021   Generalized anxiety disorder    Hyperlipidemia    Hypertension    Insomnia 03/12/2020   Major depressive disorder    Mild cognitive impairment 06/08/2021   Pre-diabetes    Seasonal allergies    Suicidal overdose 01/20/2021   Tremors of nervous system 03/12/2020    Social History   Socioeconomic History   Marital status: Media planner    Spouse name: Not on file   Number of children: Not on file   Years of education: 14   Highest education level: Associate degree: occupational, Scientist, product/process development, or vocational program  Occupational History   Occupation: Substance abuse peer support  Tobacco Use   Smoking status: Former   Smokeless tobacco: Never  Advertising account planner    Vaping status: Never Used  Substance and Sexual Activity   Alcohol use: Not Currently    Comment: hx of alcohol abuse; sober since 2019   Drug use: Never   Sexual activity: Not on file  Other Topics Concern   Not on file  Social History Narrative   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, 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   Married to partner    Right handed   Drinks caffeine   Two story home      Social Drivers of Health   Financial Resource Strain: Low Risk  (08/01/2023)   Overall Financial Resource Strain (CARDIA)    Difficulty of Paying Living Expenses: Not very hard  Food Insecurity: No Food Insecurity (08/01/2023)   Hunger Vital Sign    Worried About Running Out of Food in the Last Year: Never true    Ran Out of Food in the Last Year: Never true  Transportation Needs: No Transportation Needs (08/01/2023)   PRAPARE - Administrator, Civil Service (Medical): No    Lack of Transportation (Non-Medical): No  Physical Activity: Sufficiently Active (08/01/2023)   Exercise Vital Sign    Days of Exercise per Week: 6 days    Minutes of Exercise per Session: 90 min  Stress: Stress Concern Present (08/01/2023)   [employer] of Occupational Health - Occupational Stress Questionnaire    Feeling of Stress : To some extent  Social Connections: Socially Integrated (08/01/2023)   Social Connection and Isolation Panel [NHANES]    Frequency of Communication with Friends and Family:  Once a week    Frequency of Social Gatherings with Friends and Family: Three times a week    Attends Religious Services: 1 to 4 times per year    Active Member of Clubs or Organizations: Yes    Attends Banker Meetings: More than 4 times per year    Marital Status: Married  Catering manager Violence: Not At Risk (12/20/2022)   Humiliation, Afraid, Rape, and Kick questionnaire    Fear of Current or Ex-Partner: No    Emotionally Abused: No    Physically Abused: No     Sexually Abused: No    Past Surgical History:  Procedure Laterality Date   ABDOMINAL HYSTERECTOMY  1998    Family History  Problem Relation Age of Onset   High Cholesterol Mother    High blood pressure Mother    Diabetes Mother    High blood pressure Father    High Cholesterol Father    Diabetes Father    Dementia Father    High blood pressure Brother    High Cholesterol Brother    Breast cancer Neg Hx     No Known Allergies  Current Outpatient Medications on File Prior to Visit  Medication Sig Dispense Refill   albuterol (VENTOLIN HFA) 108 (90 Base) MCG/ACT inhaler TAKE 2 PUFFS BY MOUTH EVERY 6 HOURS AS NEEDED FOR WHEEZE OR SHORTNESS OF BREATH 8.5 each 0   benztropine (COGENTIN) 1 MG tablet TAKE 1 TABLET BY MOUTH EVERY DAY 90 tablet 1   betamethasone dipropionate 0.05 % cream Apply topically 2 (two) times daily. 30 g 1   citalopram (CELEXA) 10 MG tablet TAKE 1 TABLET BY MOUTH EVERY DAY 90 tablet 1   donepezil (ARICEPT) 10 MG tablet Take 1 tablet (10 mg total) by mouth every morning. 90 tablet 3   ezetimibe (ZETIA) 10 MG tablet TAKE 1 TABLET BY MOUTH EVERY DAY 90 tablet 1   fluticasone (FLONASE) 50 MCG/ACT nasal spray SPRAY 2 SPRAYS INTO EACH NOSTRIL EVERY DAY 48 mL 2   Magnesium 250 MG TABS Take 250 mg by mouth daily as needed (cramps).     Multiple Vitamin (MULTIVITAMIN WITH MINERALS) TABS tablet Take 1 tablet by mouth every morning. ALIVE     naproxen sodium (ALEVE) 220 MG tablet Take 220-440 mg by mouth 2 (two) times daily as needed (pain).     ramipril (ALTACE) 5 MG capsule Take 1 capsule (5 mg total) by mouth daily. 90 capsule 3   rosuvastatin (CRESTOR) 40 MG tablet TAKE 1 TABLET BY MOUTH EVERY DAY 90 tablet 1   Semaglutide-Weight Management (WEGOVY) 2.4 MG/0.75ML SOAJ Inject 2.4 mg into the skin once a week. 9 mL 0   [DISCONTINUED] escitalopram (LEXAPRO) 10 MG tablet TAKE 1 TABLET BY MOUTH EVERY DAY 90 tablet 0   No current facility-administered medications on file  prior to visit.    BP 120/80   Pulse 65   Temp 98.3 F (36.8 C) (Oral)   Ht 5\' 7"  (1.702 m)   Wt 153 lb (69.4 kg)   SpO2 95%   BMI 23.96 kg/m       Objective:   Physical Exam Vitals and nursing note reviewed.  Musculoskeletal:        General: Tenderness present. No swelling. Normal range of motion.  Skin:    General: Skin is warm and dry.     Findings: No erythema.  Neurological:     General: No focal deficit present.     Mental Status:  She is alert and oriented to person, place, and time.  Psychiatric:        Mood and Affect: Mood normal.        Behavior: Behavior normal.        Thought Content: Thought content normal.        Judgment: Judgment normal.       Assessment & Plan:  1. Chronic joint pain (Primary) Discussed risks and benefits of corticosteroid injection and patient consented.  After prepping skin with betadine, injected 80 mg depomedrol and 2 cc of plain xylocaine with 22 gauge one and one half inch needle using anterolateral approach and pt tolerated well.  - methylPREDNISolone acetate (DEPO-MEDROL) injection 80 mg - Follow up as needed  Shirline Frees, NP

## 2023-09-07 ENCOUNTER — Ambulatory Visit: Payer: Medicare PPO | Admitting: Psychology

## 2023-09-07 DIAGNOSIS — F1021 Alcohol dependence, in remission: Secondary | ICD-10-CM

## 2023-09-07 DIAGNOSIS — G3184 Mild cognitive impairment, so stated: Secondary | ICD-10-CM | POA: Diagnosis not present

## 2023-09-07 NOTE — Progress Notes (Signed)
   Neuropsychology Feedback Session [name]. [facility] [place] Department of Neurology  Reason for Referral:   Ellen [name] is a 68 y.o. right-handed Caucasian female referred by Marlowe Kays, PA-C, to characterize her current cognitive functioning and assist with diagnostic clarity and treatment planning in the context of a prior mild neurocognitive disorder diagnosis and concerns for progressive cognitive decline.   Feedback:   [name] completed a comprehensive neuropsychological evaluation on 08/31/2023. Please refer to that encounter for the full report and recommendations. Briefly, results suggested significant impairment surrounding visuoconstructional abilities (i.e., clock drawing, figure copy) and encoding (i.e., learning) aspects of memory. Further performance variability was exhibited across processing speed, attention/concentration, executive functioning, and both delayed retrieval and recognition/consolidation aspects of memory. Relative to her previous October 2022 evaluation, decline was exhibited across visuoconstructional abilities (figure copy in particular), encoding aspects of story-based content, and delayed retrieval of a previously learned list of words. Outside of this, other assessed domains where direct comparisons were possible exhibited relative stability. The etiology for ongoing cognitive dysfunction remains unclear. Current testing patterns are certainly reasonable given her very lengthy history of prominent alcohol abuse and dependence. However, mild declines across various areas across the evaluation are curious as one would expect relative stability given that [name] has reportedly maintained complete sobriety for several years at this point. As her most recent brain MRI was performed in April 2022, there remains the potential for progression of previously identified microvascular ischemic disease or other anatomical changes which could  influence cognitive decline. However, this is purely speculative at this time. She did report increased anxiety severity relative to previous testing. This may have negatively influenced current testing patterns. Despite some mild decline, current testing patterns do not elevate concerns for an underlying neurodegenerative illness at the present time. Despite 0% retention after a brief delay across a list learning task, retention rates were adequate across story and figure tasks. Memory consolidation performances were also adequate across all tasks. Overall, current testing does not suggest the presence of rapid forgetting or a pronounced storage impairment in a compelling fashion, making symptomatic Alzheimer's disease less likely.  [name] was unaccompanied during the current telephone call. She was within her residence while I was within my office. I discussed the limitations of evaluation and management by telemedicine and the availability of in person appointments. [name] expressed her understanding and agreed to proceed. Content of the current session focused on the results of her neuropsychological evaluation. [name] was given the opportunity to ask questions and her questions were answered. She was encouraged to reach out should additional questions arise. Her report is available to her on MyChart.      One unit [phone] was billed for Dr. Tammy Sours time spent preparing for, conducting, and documenting the current feedback session with [name].

## 2023-09-08 ENCOUNTER — Ambulatory Visit: Payer: Medicare PPO | Admitting: Physician Assistant

## 2023-09-15 ENCOUNTER — Encounter: Payer: Self-pay | Admitting: Physician Assistant

## 2023-09-15 ENCOUNTER — Ambulatory Visit: Payer: Medicare PPO | Admitting: Physician Assistant

## 2023-09-15 VITALS — BP 145/80 | HR 74 | Resp 20 | Ht 67.0 in | Wt 151.0 lb

## 2023-09-15 DIAGNOSIS — G3184 Mild cognitive impairment, so stated: Secondary | ICD-10-CM | POA: Diagnosis not present

## 2023-09-15 NOTE — Patient Instructions (Signed)
Good to see you doing well!  Continue Donepezil 10 mg daily  MRI Brain in the near future  2. Control mood as per PCP and continue group therapy   3. Follow-up in 6 months, call for any changes    RECOMMENDATIONS FOR ALL PATIENTS WITH MEMORY PROBLEMS: 1. Continue to exercise (Recommend 30 minutes of walking everyday, or 3 hours every week) 2. Increase social interactions - continue going to [place] and enjoy social gatherings with friends and family 3. Eat healthy, avoid fried foods and eat more fruits and vegetables 4. Maintain adequate blood pressure, blood sugar, and blood cholesterol level. Reducing the risk of stroke and cardiovascular disease also helps promoting better memory. 5. Avoid stressful situations. Live a simple life and avoid aggravations. Organize your time and prepare for the next day in anticipation. 6. Sleep well, avoid any interruptions of sleep and avoid any distractions in the bedroom that may interfere with adequate sleep quality 7. Avoid sugar, avoid sweets as there is a strong link between excessive sugar intake, diabetes, and cognitive impairment The Mediterranean diet has been shown to help patients reduce the risk of progressive memory disorders and reduces cardiovascular risk. This includes eating fish, eat fruits and green leafy vegetables, nuts like almonds and hazelnuts, walnuts, and also use olive oil. Avoid fast foods and fried foods as much as possible. Avoid sweets and sugar as sugar use has been linked to worsening of memory function.
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
## 2023-09-16 ENCOUNTER — Ambulatory Visit: Payer: Medicare PPO

## 2023-09-29 ENCOUNTER — Ambulatory Visit: Payer: Medicare PPO

## 2023-10-10 ENCOUNTER — Encounter: Payer: Self-pay | Admitting: Adult Health

## 2023-10-10 DIAGNOSIS — Z713 Dietary counseling and surveillance: Secondary | ICD-10-CM

## 2023-10-10 DIAGNOSIS — R7303 Prediabetes: Secondary | ICD-10-CM

## 2023-10-12 MED ORDER — WEGOVY 2.4 MG/0.75ML ~~LOC~~ SOAJ: 2.4000 mg | SUBCUTANEOUS | 0 refills | Status: DC

## 2023-10-12 NOTE — Telephone Encounter (Signed)
 Ok to fill the current dose?

## 2023-10-13 ENCOUNTER — Ambulatory Visit
Admission: RE | Admit: 2023-10-13 | Discharge: 2023-10-13 | Disposition: A | Discharge status: Home / Self Care | Payer: Medicare PPO | Source: Ambulatory Visit | Attending: Adult Health | Admitting: Adult Health

## 2023-10-13 DIAGNOSIS — Z1231 Encounter for screening mammogram for malignant neoplasm of breast: Secondary | ICD-10-CM | POA: Diagnosis not present

## 2023-10-16 ENCOUNTER — Other Ambulatory Visit: Payer: Self-pay | Admitting: Adult Health

## 2023-10-16 DIAGNOSIS — R7303 Prediabetes: Secondary | ICD-10-CM

## 2023-10-16 DIAGNOSIS — Z713 Dietary counseling and surveillance: Secondary | ICD-10-CM

## 2023-11-02 ENCOUNTER — Other Ambulatory Visit: Payer: Self-pay | Admitting: Physician Assistant

## 2023-11-02 ENCOUNTER — Other Ambulatory Visit: Payer: Self-pay | Admitting: Adult Health

## 2023-11-02 DIAGNOSIS — R413 Other amnesia: Secondary | ICD-10-CM

## 2023-11-02 DIAGNOSIS — R251 Tremor, unspecified: Secondary | ICD-10-CM

## 2023-11-03 NOTE — Telephone Encounter (Signed)
 Okay for refill?

## 2023-11-07 ENCOUNTER — Encounter: Payer: Self-pay | Admitting: Adult Health

## 2023-11-11 ENCOUNTER — Other Ambulatory Visit: Payer: Self-pay | Admitting: Adult Health

## 2023-11-11 ENCOUNTER — Ambulatory Visit: Admitting: Adult Health

## 2023-11-11 VITALS — BP 110/70 | HR 51 | Temp 98.0°F | Ht 67.0 in | Wt 151.0 lb

## 2023-11-11 DIAGNOSIS — Z713 Dietary counseling and surveillance: Secondary | ICD-10-CM

## 2023-11-11 DIAGNOSIS — R300 Dysuria: Secondary | ICD-10-CM | POA: Diagnosis not present

## 2023-11-11 DIAGNOSIS — R7303 Prediabetes: Secondary | ICD-10-CM

## 2023-11-11 LAB — POCT URINALYSIS DIPSTICK
Bilirubin, UA: NEGATIVE
Glucose, UA: NEGATIVE
Ketones, UA: NEGATIVE
Nitrite, UA: NEGATIVE
Protein, UA: NEGATIVE
Spec Grav, UA: 1.015 (ref 1.010–1.025)
Urobilinogen, UA: 0.2 U/dL
pH, UA: 7.5 (ref 5.0–8.0)

## 2023-11-11 MED ORDER — AMOXICILLIN-POT CLAVULANATE 875-125 MG PO TABS: 1.0000 | ORAL_TABLET | Freq: Two times a day (BID) | ORAL | 0 refills | Status: AC

## 2023-11-11 NOTE — Progress Notes (Signed)
 Subjective:    Patient ID: Ellen [name], female    DOB: Mar 06, 1956, 68 y.o.   MRN: [id number]  Dysuria    68 year old female who  has a past medical history of Alcohol dependence in sustained full remission (05/28/2021), Generalized anxiety disorder, Hyperlipidemia, Hypertension, Insomnia (03/12/2020), Major depressive disorder, Mild cognitive impairment (06/08/2021), Pre-diabetes, Seasonal allergies, Suicidal overdose (01/20/2021), and Tremors of nervous system (03/12/2020).  She presents to the office today for an acute visit. She reports that for over the last two weeks she has been experiencing odorous urine, dysuria, urinary frequency with decreased urination.   She denies fevers, chills, low back pain, or pelvic pressure.    Review of Systems  Genitourinary:  Positive for dysuria.   See HPI   Past Medical History:  Diagnosis Date   Alcohol dependence in sustained full remission 05/28/2021   Generalized anxiety disorder    Hyperlipidemia    Hypertension    Insomnia 03/12/2020   Major depressive disorder    Mild cognitive impairment 06/08/2021   Pre-diabetes    Seasonal allergies    Suicidal overdose 01/20/2021   Tremors of nervous system 03/12/2020    Social History   Socioeconomic History   Marital status: Media planner    Spouse name: Not on file   Number of children: Not on file   Years of education: 14   Highest education level: Associate degree: occupational, Scientist, product/process development, or vocational program  Occupational History   Occupation: Substance abuse peer support  Tobacco Use   Smoking status: Former   Smokeless tobacco: Never  Advertising account planner   Vaping status: Never Used  Substance and Sexual Activity   Alcohol use: Not Currently    Comment: hx of alcohol abuse; sober since 2019   Drug use: Never   Sexual activity: Not on file  Other Topics Concern   Not on file  Social History Narrative   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, 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 [address]   Married to partner    Right handed   Drinks caffeine   Two story home      Social Drivers of Health   Financial Resource Strain: Low Risk  (08/01/2023)   Overall Financial Resource Strain (CARDIA)    Difficulty of Paying Living Expenses: Not very hard  Food Insecurity: No Food Insecurity (08/01/2023)   Hunger Vital Sign    Worried About Running Out of Food in the Last Year: Never true    Ran Out of Food in the Last Year: Never true  Transportation Needs: No Transportation Needs (08/01/2023)   PRAPARE - Administrator, Civil Service (Medical): No    Lack of Transportation (Non-Medical): No  Physical Activity: Sufficiently Active (08/01/2023)   Exercise Vital Sign    Days of Exercise per Week: 6 days    Minutes of Exercise per Session: 90 min  Stress: Stress Concern Present (08/01/2023)   [employer] of Occupational Health - Occupational Stress Questionnaire    Feeling of Stress : To some extent  Social Connections: Socially Integrated (08/01/2023)   Social Connection and Isolation Panel [NHANES]    Frequency of Communication with Friends and Family: Once a week    Frequency of Social Gatherings with Friends and Family: Three times a week    Attends Religious Services: 1 to 4 times per year    Active Member of Clubs or Organizations: Yes    Attends Banker  Meetings: More than 4 times per year    Marital Status: Married  Catering manager Violence: Not At Risk (12/20/2022)   Humiliation, Afraid, Rape, and Kick questionnaire    Fear of Current or Ex-Partner: No    Emotionally Abused: No    Physically Abused: No    Sexually Abused: No    Past Surgical History:  Procedure Laterality Date   ABDOMINAL HYSTERECTOMY  1998    Family History  Problem Relation Age of Onset   High Cholesterol Mother    High blood pressure Mother    Diabetes Mother    High blood pressure Father    High Cholesterol Father    Diabetes  Father    Dementia Father    High blood pressure Brother    High Cholesterol Brother    Breast cancer Neg Hx     No Known Allergies  Current Outpatient Medications on File Prior to Visit  Medication Sig Dispense Refill   benztropine (COGENTIN) 1 MG tablet TAKE 1 TABLET BY MOUTH EVERY DAY 90 tablet 1   betamethasone dipropionate 0.05 % cream Apply topically 2 (two) times daily. 30 g 1   citalopram (CELEXA) 10 MG tablet TAKE 1 TABLET BY MOUTH EVERY DAY 90 tablet 1   donepezil (ARICEPT) 10 MG tablet TAKE 1 TABLET (10 MG TOTAL) BY MOUTH EVERY MORNING. 90 tablet 3   ezetimibe (ZETIA) 10 MG tablet TAKE 1 TABLET BY MOUTH EVERY DAY 90 tablet 1   Magnesium 250 MG TABS Take 250 mg by mouth daily as needed (cramps).     Multiple Vitamin (MULTIVITAMIN WITH MINERALS) TABS tablet Take 1 tablet by mouth every morning. ALIVE     naproxen sodium (ALEVE) 220 MG tablet Take 220-440 mg by mouth 2 (two) times daily as needed (pain).     ramipril (ALTACE) 5 MG capsule Take 1 capsule (5 mg total) by mouth daily. 90 capsule 3   rosuvastatin (CRESTOR) 40 MG tablet TAKE 1 TABLET BY MOUTH EVERY DAY 90 tablet 1   Semaglutide-Weight Management (WEGOVY) 2.4 MG/0.75ML SOAJ Inject 2.4 mg into the skin once a week. 9 mL 0   [DISCONTINUED] escitalopram (LEXAPRO) 10 MG tablet TAKE 1 TABLET BY MOUTH EVERY DAY 90 tablet 0   No current facility-administered medications on file prior to visit.    BP 110/70   Pulse (!) 51   Temp 98 F (36.7 C) (Oral)   Ht 5\' 7"  (1.702 m)   Wt 151 lb (68.5 kg)   SpO2 93%   BMI 23.65 kg/m       Objective:   Physical Exam Vitals and nursing note reviewed.  Constitutional:      Appearance: Normal appearance.  Cardiovascular:     Rate and Rhythm: Normal rate and regular rhythm.     Pulses: Normal pulses.     Heart sounds: Normal heart sounds.  Pulmonary:     Effort: Pulmonary effort is normal.     Breath sounds: Normal breath sounds.  Abdominal:     Tenderness: There is no  abdominal tenderness. There is no right CVA tenderness or left CVA tenderness.  Musculoskeletal:        General: Normal range of motion.  Skin:    General: Skin is warm and dry.     Capillary Refill: Capillary refill takes less than 2 seconds.  Neurological:     Mental Status: She is alert.  Psychiatric:        Mood and Affect: Mood normal.  Behavior: Behavior normal.        Thought Content: Thought content normal.        Judgment: Judgment normal.        Assessment & Plan:  1. Dysuria (Primary)  - POC Urinalysis Dipstick 3 + leuks, +bloody. Will start on Augmentin  - Urine Culture; Future - amoxicillin-clavulanate (AUGMENTIN) 875-125 MG tablet; Take 1 tablet by mouth 2 (two) times daily for 5 days.  Dispense: 10 tablet; Refill: 0 Shirline Frees, NP

## 2023-11-14 LAB — URINE CULTURE
MICRO NUMBER:: 16261085
SPECIMEN QUALITY:: ADEQUATE

## 2023-11-15 ENCOUNTER — Encounter: Payer: Self-pay | Admitting: Adult Health

## 2023-11-15 NOTE — Telephone Encounter (Signed)
  The original prescription was discontinued on 11/26/2022 by Sherrin Daisy, CMA for the following reason: Reorder. Renewing this prescription may not be appropriate.

## 2023-11-21 ENCOUNTER — Encounter: Payer: Self-pay | Admitting: Adult Health

## 2023-11-22 NOTE — Telephone Encounter (Signed)
**Note De-identified  [place] Obfuscation** Please advise 

## 2023-12-08 ENCOUNTER — Other Ambulatory Visit: Payer: Self-pay | Admitting: Adult Health

## 2023-12-08 DIAGNOSIS — Z713 Dietary counseling and surveillance: Secondary | ICD-10-CM

## 2023-12-08 DIAGNOSIS — R7303 Prediabetes: Secondary | ICD-10-CM

## 2023-12-19 ENCOUNTER — Encounter: Payer: Self-pay | Admitting: Adult Health

## 2023-12-21 ENCOUNTER — Other Ambulatory Visit: Payer: Self-pay | Admitting: Adult Health

## 2023-12-21 NOTE — Telephone Encounter (Signed)
**Note De-identified  [place] Obfuscation** Please advise 

## 2023-12-28 ENCOUNTER — Ambulatory Visit (INDEPENDENT_AMBULATORY_CARE_PROVIDER_SITE_OTHER): Payer: Medicare PPO

## 2023-12-28 VITALS — Ht 67.0 in | Wt 151.0 lb

## 2023-12-28 DIAGNOSIS — Z0000 Encounter for general adult medical examination without abnormal findings: Secondary | ICD-10-CM | POA: Diagnosis not present

## 2023-12-28 NOTE — Progress Notes (Signed)
 Subjective:   Ellen [name] is a 68 y.o. who presents for a Medicare Wellness preventive visit.  As a reminder, Annual Wellness Visits don't include a physical exam, and some assessments may be limited, especially if this visit is performed virtually. We may recommend an in-person visit if needed.  Visit Complete: Virtual I connected with  [name] on 12/28/23 by a audio enabled telemedicine application and verified that I am speaking with the correct person using two identifiers.  Patient Location: Home  Provider Location: Home Office  I discussed the limitations of evaluation and management by telemedicine. The patient expressed understanding and agreed to proceed.  Vital Signs: Because this visit was a virtual/telehealth visit, some criteria may be missing or patient reported. Any vitals not documented were not able to be obtained and vitals that have been documented are patient reported.    Persons Participating in Visit: Patient.  AWV Questionnaire: Yes: Patient Medicare AWV questionnaire was completed by the patient on 12/23/23; I have confirmed that all information answered by patient is correct and no changes since this date.  Cardiac Risk Factors include: advanced age (>89men, >39 women);hypertension     Objective:     Today's Vitals   12/28/23 1043  Weight: 151 lb (68.5 kg)  Height: 5\' 7"  (1.702 m)   Body mass index is 23.65 kg/m.     12/28/2023   10:51 AM 09/15/2023    8:50 AM 03/08/2023    9:02 AM 12/20/2022   11:30 AM 09/13/2022    9:06 AM 03/11/2022    9:01 AM 12/16/2021   10:56 AM  Advanced Directives  Does Patient Have a Medical Advance Directive? Yes No Yes Yes Yes Yes Yes  Type of Estate agent of [place];Living will  Healthcare Power of [employer] of [place];Living will  Healthcare Power of [place];Out of facility DNR (pink MOST or yellow form);Living will Healthcare Power of [place];Living will   Does patient want to make changes to medical advance directive? No - Patient declined  No - Patient declined No - Patient declined   No - Patient declined  Copy of Healthcare Power of Attorney in Chart? Yes - validated most recent copy scanned in chart (See row information)  No - copy requested Yes - validated most recent copy scanned in chart (See row information)   No - copy requested  Would patient like information on creating a medical advance directive?  No - Patient declined         Current Medications (verified) Outpatient Encounter Medications as of 12/28/2023  Medication Sig   benztropine  (COGENTIN ) 1 MG tablet TAKE 1 TABLET BY MOUTH EVERY DAY   betamethasone  dipropionate 0.05 % cream Apply topically 2 (two) times daily.   citalopram  (CELEXA ) 10 MG tablet TAKE 1 TABLET BY MOUTH EVERY DAY   donepezil  (ARICEPT ) 10 MG tablet TAKE 1 TABLET (10 MG TOTAL) BY MOUTH EVERY MORNING.   ezetimibe  (ZETIA ) 10 MG tablet TAKE 1 TABLET BY MOUTH EVERY DAY   Magnesium 250 MG TABS Take 250 mg by mouth daily as needed (cramps).   Multiple Vitamin (MULTIVITAMIN WITH MINERALS) TABS tablet Take 1 tablet by mouth every morning. ALIVE   naproxen sodium (ALEVE) 220 MG tablet Take 220-440 mg by mouth 2 (two) times daily as needed (pain).   ramipril  (ALTACE ) 5 MG capsule Take 1 capsule (5 mg total) by mouth daily.   rosuvastatin  (CRESTOR ) 40 MG tablet TAKE 1 TABLET BY MOUTH EVERY DAY  WEGOVY  2.4 MG/0.75ML SOAJ Inject 2.4 mg into the skin once a week.   [DISCONTINUED] escitalopram  (LEXAPRO ) 10 MG tablet TAKE 1 TABLET BY MOUTH EVERY DAY   No facility-administered encounter medications on file as of 12/28/2023.    Allergies (verified) Patient has no known allergies.   History: Past Medical History:  Diagnosis Date   Alcohol dependence in sustained full remission 05/28/2021   Generalized anxiety disorder    Hyperlipidemia    Hypertension    Insomnia 03/12/2020   Major depressive disorder    Mild  cognitive impairment 06/08/2021   Pre-diabetes    Seasonal allergies    Suicidal overdose 01/20/2021   Tremors of nervous system 03/12/2020   Past Surgical History:  Procedure Laterality Date   ABDOMINAL HYSTERECTOMY  1998   Family History  Problem Relation Age of Onset   High Cholesterol Mother    High blood pressure Mother    Diabetes Mother    High blood pressure Father    High Cholesterol Father    Diabetes Father    Dementia Father    High blood pressure Brother    High Cholesterol Brother    Breast cancer Neg Hx    Social History   Socioeconomic History   Marital status: Media planner    Spouse name: Not on file   Number of children: Not on file   Years of education: 14   Highest education level: Associate degree: occupational, Scientist, product/process development, or vocational program  Occupational History   Occupation: Substance abuse peer support  Tobacco Use   Smoking status: Former   Smokeless tobacco: Never  Advertising account planner   Vaping status: Never Used  Substance and Sexual Activity   Alcohol use: Not Currently    Comment: hx of alcohol abuse; sober since 2019   Drug use: Never   Sexual activity: Not on file  Other Topics Concern   Not on file  Social History Narrative   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, 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   Married to partner    Right handed   Drinks caffeine   Two story home      Social Drivers of Health   Financial Resource Strain: Low Risk  (12/28/2023)   Overall Financial Resource Strain (CARDIA)    Difficulty of Paying Living Expenses: Not hard at all  Food Insecurity: No Food Insecurity (12/28/2023)   Hunger Vital Sign    Worried About Running Out of Food in the Last Year: Never true    Ran Out of Food in the Last Year: Never true  Transportation Needs: No Transportation Needs (12/28/2023)   PRAPARE - Administrator, Civil Service (Medical): No    Lack of Transportation (Non-Medical): No  Physical  Activity: Sufficiently Active (12/28/2023)   Exercise Vital Sign    Days of Exercise per Week: 4 days    Minutes of Exercise per Session: 120 min  Stress: No Stress Concern Present (12/28/2023)   [employer] of Occupational Health - Occupational Stress Questionnaire    Feeling of Stress : Not at all  Social Connections: Moderately Integrated (12/28/2023)   Social Connection and Isolation Panel [NHANES]    Frequency of Communication with Friends and Family: More than three times a week    Frequency of Social Gatherings with Friends and Family: More than three times a week    Attends Religious Services: Never    Database administrator or Organizations: Yes  Attends Banker Meetings: More than 4 times per year    Marital Status: Living with partner    Tobacco Counseling Counseling given: Not Answered    Clinical Intake:  Pre-visit preparation completed: Yes  Pain : No/denies pain     BMI - recorded: 23.65 Nutritional Status: BMI of 19-24  Normal Nutritional Risks: None Diabetes: Yes CBG done?: No Did pt. bring in CBG monitor from home?: No  Lab Results  Component Value Date   HGBA1C 5.7 (A) 08/04/2023   HGBA1C 5.6 12/31/2022   HGBA1C 6.4 09/01/2022     How often do you need to have someone help you when you read instructions, pamphlets, or other written materials from your doctor or pharmacy?: 1 - Never  Interpreter Needed?: No  Information entered by :: Farris Hong LPN   Activities of Daily Living     12/28/2023   10:50 AM 12/23/2023    1:04 PM  In your present state of health, do you have any difficulty performing the following activities:  Hearing? 0 0  Vision? 0 0  Difficulty concentrating or making decisions? 0 0  Walking or climbing stairs? 0 0  Dressing or bathing? 0 0  Doing errands, shopping? 0 0  Preparing Food and eating ? N N  Using the Toilet? N N  In the past six months, have you accidently leaked urine? N N  Do you have  problems with loss of bowel control? N N  Managing your Medications? N N  Managing your Finances? N N  Housekeeping or managing your Housekeeping? N N    Patient Care Team: Alto Atta, NP as PCP - General (Family Medicine) Jhonny Moss, MD as Consulting Physician (Neurology)  Indicate any recent Medical Services you may have received from other than [facility] providers in the past year (date may be approximate).     Assessment:    This is a routine wellness examination for [place].  Hearing/Vision screen Hearing Screening - Comments:: Denies hearing difficulties   Vision Screening - Comments:: Wears rx glasses - up to date with routine eye exams with  Dr Wyvonna Heidelberg   Goals Addressed               This Visit's Progress     Remain Active (pt-stated)         Depression Screen     12/28/2023   10:49 AM 09/01/2023    9:32 AM 12/31/2022    9:25 AM 12/20/2022   11:25 AM 12/16/2021   10:48 AM 01/28/2021   10:57 AM 05/21/2020   10:00 AM  PHQ 2/9 Scores  PHQ - 2 Score 0  0 0 0  0  PHQ- 9 Score   0    0  Exception Documentation  Medical reason          Information is confidential and restricted. Go to Review Flowsheets to unlock data.    Fall Risk     12/28/2023   10:51 AM 12/23/2023    1:04 PM 09/15/2023    8:49 AM 03/08/2023    9:02 AM 12/29/2022    5:44 PM  Fall Risk   Falls in the past year? 0 0 0 0 0  Number falls in past yr: 0 0 0 0   Injury with Fall? 0 0 0 0   Risk for fall due to : No Fall Risks      Follow up Falls prevention discussed;Falls evaluation completed  Falls evaluation completed Falls evaluation  completed     MEDICARE RISK AT HOME:  Medicare Risk at Home Any stairs in or around the home?: Yes If so, are there any without handrails?: No Home free of loose throw rugs in walkways, pet beds, electrical cords, etc?: Yes Adequate lighting in your home to reduce risk of falls?: Yes Life alert?: No Use of a cane, walker or w/c?: No Grab bars in the bathroom?:  Yes Shower chair or bench in shower?: No Elevated toilet seat or a handicapped toilet?: No  TIMED UP AND GO:  Was the test performed?  No  Cognitive Function: 6CIT completed    03/08/2023   12:00 PM 09/13/2022   10:00 AM 03/11/2022    5:00 PM  MMSE - Mini Mental State Exam  Orientation to time 5 5 5   Orientation to Place 5 5 5   Registration 3 3 3   Attention/ Calculation 5 5 4   Recall 3 3 3   Language- name 2 objects 2 2 2   Language- repeat 1 1 1   Language- follow 3 step command 3 3 3   Language- read & follow direction 1 1 1   Write a sentence 1 1 1   Copy design 1 1 1   Total score 30 30 29       02/12/2021   10:00 AM 10/30/2020   10:00 AM  Montreal Cognitive Assessment   Visuospatial/ Executive (0/5) 3 0  Naming (0/3) 3 3  Attention: Read list of digits (0/2) 2 2  Attention: Read list of letters (0/1) 1 0  Attention: Serial 7 subtraction starting at 100 (0/3) 3 1  Language: Repeat phrase (0/2) 2 2  Language : Fluency (0/1) 1 0  Abstraction (0/2) 2 0  Delayed Recall (0/5) 3 0  Orientation (0/6) 6 4  Total 26 12      12/28/2023   10:52 AM 12/20/2022   11:30 AM 12/16/2021   10:57 AM  6CIT Screen  What Year? 0 points 0 points 0 points  What month? 0 points 0 points 0 points  What time? 0 points 0 points 0 points  Count back from 20 0 points 0 points 0 points  Months in reverse 0 points 0 points 0 points  Repeat phrase 0 points 0 points 0 points  Total Score 0 points 0 points 0 points    Immunizations Immunization History  Administered Date(s) Administered   Influenza, High Dose Seasonal PF 06/17/2021   Influenza,inj,Quad PF,6+ Mos 06/21/2018, 05/21/2020   Influenza-Unspecified 05/20/2022   PFIZER(Purple Top)SARS-COV-2 Vaccination 10/24/2019, 11/13/2019, 07/25/2020, 05/20/2022   Pfizer Covid-19 Vaccine Bivalent Booster 40yrs & up 10/02/2021    Screening Tests Health Maintenance  Topic Date Due   DTaP/Tdap/Td (1 - Tdap) Never done   Pneumonia Vaccine 65+ Years  old (1 of 2 - PCV) Never done   Zoster Vaccines- Shingrix (1 of 2) Never done   Colonoscopy  08/16/2022   COVID-19 Vaccine (6 - 2024-25 season) 04/17/2023   INFLUENZA VACCINE  03/16/2024   Medicare Annual Wellness (AWV)  12/27/2024   MAMMOGRAM  10/12/2025   DEXA SCAN  Completed   Hepatitis C Screening  Completed   HPV VACCINES  Aged Out   Meningococcal B Vaccine  Aged Out    Health Maintenance  Health Maintenance Due  Topic Date Due   DTaP/Tdap/Td (1 - Tdap) Never done   Pneumonia Vaccine 23+ Years old (1 of 2 - PCV) Never done   Zoster Vaccines- Shingrix (1 of 2) Never done   Colonoscopy  08/16/2022  COVID-19 Vaccine (6 - 2024-25 season) 04/17/2023   Health Maintenance Items Addressed: All vaccines deferred.   Additional Screening:  Vision Screening: Recommended annual ophthalmology exams for early detection of glaucoma and other disorders of the eye.  Dental Screening: Recommended annual dental exams for proper oral hygiene  Community Resource Referral / Chronic Care Management: CRR required this visit?  No   CCM required this visit?  No   Plan:    I have personally reviewed and noted the following in the patient's chart:   Medical and social history Use of alcohol, tobacco or illicit drugs  Current medications and supplements including opioid prescriptions. Patient is not currently taking opioid prescriptions. Functional ability and status Nutritional status Physical activity Advanced directives List of other physicians Hospitalizations, surgeries, and ER visits in previous 12 months Vitals Screenings to include cognitive, depression, and falls Referrals and appointments  In addition, I have reviewed and discussed with patient certain preventive protocols, quality metrics, and best practice recommendations. A written personalized care plan for preventive services as well as general preventive health recommendations were provided to patient.   Dewayne Ford, LPN   11/22/8117   After Visit Summary: (MyChart) Due to this being a telephonic visit, the after visit summary with patients personalized plan was offered to patient via MyChart   Notes: Nothing significant to report at this time.

## 2023-12-28 NOTE — Patient Instructions (Addendum)
 Ms. Bosler , Thank you for taking time out of your busy schedule to complete your Annual Wellness Visit with me. I enjoyed our conversation and look forward to speaking with you again next year. I, as well as your care team,  appreciate your ongoing commitment to your health goals. Please review the following plan we discussed and let me know if I can assist you in the future. Your Game plan/ To Do List    Referrals: If you haven't heard from the office you've been referred to, please reach out to them at the phone provided.   Follow up Visits: Next Medicare AWV with our clinical staff: 01/02/25 @ 10:40a   Have you seen your provider in the last 6 months (3 months if uncontrolled diabetes)? Yes 11/11/23 Next Office Visit with your provider:   Clinician Recommendations:  Aim for 30 minutes of exercise or brisk walking, 6-8 glasses of water, and 5 servings of fruits and vegetables each day.       This is a list of the screening recommended for you and due dates:  Health Maintenance  Topic Date Due   DTaP/Tdap/Td vaccine (1 - Tdap) Never done   Pneumonia Vaccine (1 of 2 - PCV) Never done   Zoster (Shingles) Vaccine (1 of 2) Never done   Colon Cancer Screening  08/16/2022   COVID-19 Vaccine (6 - 2024-25 season) 04/17/2023   Flu Shot  03/16/2024   Medicare Annual Wellness Visit  12/27/2024   Mammogram  10/12/2025   DEXA scan (bone density measurement)  Completed   Hepatitis C Screening  Completed   HPV Vaccine  Aged Out   Meningitis B Vaccine  Aged Out    Advanced directives: (In Chart) A copy of your advanced directives are scanned into your chart should your provider ever need it. Advance Care Planning is important because it:  [x]  Makes sure you receive the medical care that is consistent with your values, goals, and preferences  [x]  It provides guidance to your family and loved ones and reduces their decisional burden about whether or not they are making the right decisions based on  your wishes.  Follow the link provided in your after visit summary or read over the paperwork we have mailed to you to help you started getting your Advance Directives in place. If you need assistance in completing these, please reach out to us  so that we can help you!  See attachments for Preventive Care and Fall Prevention Tips.

## 2024-01-04 ENCOUNTER — Other Ambulatory Visit: Payer: Self-pay | Admitting: Adult Health

## 2024-01-04 DIAGNOSIS — R7303 Prediabetes: Secondary | ICD-10-CM

## 2024-01-04 DIAGNOSIS — Z713 Dietary counseling and surveillance: Secondary | ICD-10-CM

## 2024-01-17 ENCOUNTER — Encounter: Payer: Self-pay | Admitting: Adult Health

## 2024-01-17 ENCOUNTER — Ambulatory Visit: Admitting: Adult Health

## 2024-01-17 VITALS — BP 98/60 | HR 67 | Temp 98.3°F | Ht 67.0 in | Wt 149.0 lb

## 2024-01-17 DIAGNOSIS — R300 Dysuria: Secondary | ICD-10-CM

## 2024-01-17 LAB — POCT URINALYSIS DIPSTICK
Bilirubin, UA: NEGATIVE
Blood, UA: POSITIVE
Glucose, UA: NEGATIVE
Ketones, UA: NEGATIVE
Nitrite, UA: NEGATIVE
Protein, UA: POSITIVE — AB
Spec Grav, UA: 1.025 (ref 1.010–1.025)
Urobilinogen, UA: 0.2 U/dL
pH, UA: 6 (ref 5.0–8.0)

## 2024-01-17 NOTE — Progress Notes (Signed)
 Subjective:    Patient ID: Ellen [name], female    DOB: 1956/05/14, 68 y.o.   MRN: [id number]  Dysuria  This is a new problem. The current episode started 1 to 4 weeks ago (2 weeks). The problem occurs every urination. The problem has been unchanged. The patient is experiencing no pain. There has been no fever. There is No history of pyelonephritis. Associated symptoms include frequency and urgency. Pertinent negatives include no chills, discharge, flank pain or hematuria. Associated symptoms comments: Dysuria, urgency, cloudy urine, and odorous urine. She has tried nothing for the symptoms.      Review of Systems  Constitutional:  Negative for chills.  Genitourinary:  Positive for dysuria, frequency and urgency. Negative for flank pain and hematuria.   See HPI   Past Medical History:  Diagnosis Date   Alcohol dependence in sustained full remission 05/28/2021   Generalized anxiety disorder    Hyperlipidemia    Hypertension    Insomnia 03/12/2020   Major depressive disorder    Mild cognitive impairment 06/08/2021   Pre-diabetes    Seasonal allergies    Suicidal overdose 01/20/2021   Tremors of nervous system 03/12/2020    Social History   Socioeconomic History   Marital status: Media planner    Spouse name: Not on file   Number of children: Not on file   Years of education: 14   Highest education level: Associate degree: occupational, Scientist, product/process development, or vocational program  Occupational History   Occupation: Substance abuse peer support  Tobacco Use   Smoking status: Former   Smokeless tobacco: Never  Advertising account planner   Vaping status: Never Used  Substance and Sexual Activity   Alcohol use: Not Currently    Comment: hx of alcohol abuse; sober since 2019   Drug use: Never   Sexual activity: Not on file  Other Topics Concern   Not on file  Social History Narrative   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, 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   Married  to partner    Right handed   Drinks caffeine   Two story home      Social Drivers of Health   Financial Resource Strain: Low Risk  (12/28/2023)   Overall Financial Resource Strain (CARDIA)    Difficulty of Paying Living Expenses: Not hard at all  Food Insecurity: No Food Insecurity (12/28/2023)   Hunger Vital Sign    Worried About Running Out of Food in the Last Year: Never true    Ran Out of Food in the Last Year: Never true  Transportation Needs: No Transportation Needs (12/28/2023)   PRAPARE - Administrator, Civil Service (Medical): No    Lack of Transportation (Non-Medical): No  Physical Activity: Sufficiently Active (12/28/2023)   Exercise Vital Sign    Days of Exercise per Week: 4 days    Minutes of Exercise per Session: 120 min  Stress: No Stress Concern Present (12/28/2023)   [employer] of Occupational Health - Occupational Stress Questionnaire    Feeling of Stress : Not at all  Social Connections: Moderately Integrated (12/28/2023)   Social Connection and Isolation Panel [NHANES]    Frequency of Communication with Friends and Family: More than three times a week    Frequency of Social Gatherings with Friends and Family: More than three times a week    Attends Religious Services: Never    Database administrator or Organizations: Yes  Attends Banker Meetings: More than 4 times per year    Marital Status: Living with partner  Intimate Partner Violence: Not At Risk (12/28/2023)   Humiliation, Afraid, Rape, and Kick questionnaire    Fear of Current or Ex-Partner: No    Emotionally Abused: No    Physically Abused: No    Sexually Abused: No    Past Surgical History:  Procedure Laterality Date   ABDOMINAL HYSTERECTOMY  1998    Family History  Problem Relation Age of Onset   High Cholesterol Mother    High blood pressure Mother    Diabetes Mother    High blood pressure Father    High Cholesterol Father    Diabetes Father    Dementia  Father    High blood pressure Brother    High Cholesterol Brother    Breast cancer Neg Hx     No Known Allergies  Current Outpatient Medications on File Prior to Visit  Medication Sig Dispense Refill   benztropine  (COGENTIN ) 1 MG tablet TAKE 1 TABLET BY MOUTH EVERY DAY 90 tablet 1   betamethasone  dipropionate 0.05 % cream Apply topically 2 (two) times daily. 30 g 1   citalopram  (CELEXA ) 10 MG tablet TAKE 1 TABLET BY MOUTH EVERY DAY 90 tablet 1   donepezil  (ARICEPT ) 10 MG tablet TAKE 1 TABLET (10 MG TOTAL) BY MOUTH EVERY MORNING. 90 tablet 3   ezetimibe  (ZETIA ) 10 MG tablet TAKE 1 TABLET BY MOUTH EVERY DAY 90 tablet 1   Magnesium 250 MG TABS Take 250 mg by mouth daily as needed (cramps).     Multiple Vitamin (MULTIVITAMIN WITH MINERALS) TABS tablet Take 1 tablet by mouth every morning. ALIVE     naproxen sodium (ALEVE) 220 MG tablet Take 220-440 mg by mouth 2 (two) times daily as needed (pain).     ramipril  (ALTACE ) 5 MG capsule Take 1 capsule (5 mg total) by mouth daily. 90 capsule 3   rosuvastatin  (CRESTOR ) 40 MG tablet TAKE 1 TABLET BY MOUTH EVERY DAY 90 tablet 1   WEGOVY  2.4 MG/0.75ML SOAJ Inject 0.75 mL (2.4 mg total) under the skin once a week. 9 mL 0   [DISCONTINUED] escitalopram  (LEXAPRO ) 10 MG tablet TAKE 1 TABLET BY MOUTH EVERY DAY 90 tablet 0   No current facility-administered medications on file prior to visit.    BP 98/60   Pulse 67   Temp 98.3 F (36.8 C) (Oral)   Ht 5\' 7"  (1.702 m)   Wt 149 lb (67.6 kg)   SpO2 (!) 67%   BMI 23.34 kg/m       Objective:   Physical Exam Vitals and nursing note reviewed.  Constitutional:      Appearance: Normal appearance.  Cardiovascular:     Rate and Rhythm: Normal rate and regular rhythm.     Pulses: Normal pulses.     Heart sounds: Normal heart sounds.  Pulmonary:     Effort: Pulmonary effort is normal.     Breath sounds: Normal breath sounds.  Abdominal:     General: Abdomen is flat. Bowel sounds are normal.      Palpations: Abdomen is soft.     Tenderness: There is abdominal tenderness. There is no right CVA tenderness or left CVA tenderness.  Skin:    General: Skin is warm and dry.  Neurological:     General: No focal deficit present.     Mental Status: She is alert and oriented to person, place, and time.  Psychiatric:        Mood and Affect: Mood normal.        Behavior: Behavior normal.        Thought Content: Thought content normal.        Judgment: Judgment normal.        Assessment & Plan:  1. Dysuria (Primary)  - POC Urinalysis Dipstick _+ leuks, protein and blood. Will send culture and treat if needed  - Culture, Urine; Future  Alto Atta, NP

## 2024-01-20 ENCOUNTER — Other Ambulatory Visit: Payer: Self-pay | Admitting: Adult Health

## 2024-01-20 ENCOUNTER — Ambulatory Visit: Payer: Self-pay | Admitting: Adult Health

## 2024-01-20 DIAGNOSIS — R7303 Prediabetes: Secondary | ICD-10-CM

## 2024-01-20 DIAGNOSIS — Z713 Dietary counseling and surveillance: Secondary | ICD-10-CM

## 2024-01-20 LAB — URINE CULTURE
MICRO NUMBER:: 16532359
SPECIMEN QUALITY:: ADEQUATE

## 2024-01-20 MED ORDER — AMOXICILLIN-POT CLAVULANATE 875-125 MG PO TABS: 1.0000 | ORAL_TABLET | Freq: Two times a day (BID) | ORAL | 0 refills | Status: DC

## 2024-01-25 ENCOUNTER — Other Ambulatory Visit: Payer: Self-pay | Admitting: Adult Health

## 2024-01-25 DIAGNOSIS — F32A Depression, unspecified: Secondary | ICD-10-CM
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
## 2024-02-01 ENCOUNTER — Ambulatory Visit: Admitting: Physician Assistant

## 2024-02-01 ENCOUNTER — Encounter: Payer: Self-pay | Admitting: Physician Assistant

## 2024-02-01 VITALS — BP 120/71 | HR 70 | Resp 20 | Ht 67.0 in | Wt 149.0 lb

## 2024-02-01 DIAGNOSIS — G3184 Mild cognitive impairment, so stated: Secondary | ICD-10-CM | POA: Diagnosis not present

## 2024-02-01 DIAGNOSIS — R413 Other amnesia: Secondary | ICD-10-CM

## 2024-02-01 MED ORDER — DONEPEZIL HCL 10 MG PO TABS: 10.0000 mg | ORAL_TABLET | Freq: Every morning | ORAL | 3 refills | Status: AC

## 2024-02-01 NOTE — Patient Instructions (Signed)
Good to see you doing well!  Continue Donepezil 10 mg daily  2. Control mood as per PCP and continue group therapy   3. Follow-up in 6 months, call for any changes    RECOMMENDATIONS FOR ALL PATIENTS WITH MEMORY PROBLEMS: 1. Continue to exercise (Recommend 30 minutes of walking everyday, or 3 hours every week) 2. Increase social interactions - continue going to Church and enjoy social gatherings with friends and family 3. Eat healthy, avoid fried foods and eat more fruits and vegetables 4. Maintain adequate blood pressure, blood sugar, and blood cholesterol level. Reducing the risk of stroke and cardiovascular disease also helps promoting better memory. 5. Avoid stressful situations. Live a simple life and avoid aggravations. Organize your time and prepare for the next day in anticipation. 6. Sleep well, avoid any interruptions of sleep and avoid any distractions in the bedroom that may interfere with adequate sleep quality 7. Avoid sugar, avoid sweets as there is a strong link between excessive sugar intake, diabetes, and cognitive impairment The Mediterranean diet has been shown to help patients reduce the risk of progressive memory disorders and reduces cardiovascular risk. This includes eating fish, eat fruits and green leafy vegetables, nuts like almonds and hazelnuts, walnuts, and also use olive oil. Avoid fast foods and fried foods as much as possible. Avoid sweets and sugar as sugar use has been linked to worsening of memory function. 

## 2024-02-08 ENCOUNTER — Other Ambulatory Visit (HOSPITAL_COMMUNITY): Payer: Self-pay

## 2024-02-08 ENCOUNTER — Telehealth: Payer: Self-pay

## 2024-02-08 MED ORDER — WEGOVY 2.4 MG/0.75ML ~~LOC~~ SOAJ: SUBCUTANEOUS | 0 refills | Status: DC

## 2024-02-08 NOTE — Telephone Encounter (Signed)
 Pt notified of update. I gave pt information about the out-of-pocket savings coupon. Pt verbalized understanding.

## 2024-02-08 NOTE — Telephone Encounter (Signed)
 Pharmacy Patient Advocate Encounter   Received notification from CoverMyMeds that prior authorization for Wegovy  is required/requested.   Insurance verification completed.   The patient is insured through [employer] .   Per test claim: PA required; PA submitted to above mentioned insurance via CoverMyMeds Key/confirmation #/EOC [id number] Status is pending

## 2024-02-08 NOTE — Telephone Encounter (Signed)
 Pharmacy Patient Advocate Encounter  Received notification from [employer] that Prior Authorization for Wegovy   has been DENIED.  Full denial letter will be uploaded to the media tab. See denial reason below.   PA #/Case ID/Reference #: [id number]

## 2024-02-23 DIAGNOSIS — H43393 Other vitreous opacities, bilateral: Secondary | ICD-10-CM | POA: Diagnosis not present

## 2024-02-23 DIAGNOSIS — H35371 Puckering of macula, right eye: Secondary | ICD-10-CM | POA: Diagnosis not present

## 2024-02-23 DIAGNOSIS — H25012 Cortical age-related cataract, left eye: Secondary | ICD-10-CM | POA: Diagnosis not present

## 2024-02-23 DIAGNOSIS — E119 Type 2 diabetes mellitus without complications: Secondary | ICD-10-CM | POA: Diagnosis not present

## 2024-02-23 LAB — HM DIABETES EYE EXAM

## 2024-03-06 ENCOUNTER — Encounter: Payer: Self-pay | Admitting: Adult Health

## 2024-03-14 ENCOUNTER — Other Ambulatory Visit: Payer: Self-pay | Admitting: Adult Health

## 2024-03-14 ENCOUNTER — Encounter: Payer: Self-pay | Admitting: Adult Health

## 2024-03-14 ENCOUNTER — Ambulatory Visit: Payer: Medicare PPO | Admitting: Physician Assistant

## 2024-03-14 DIAGNOSIS — R251 Tremor, unspecified: Secondary | ICD-10-CM

## 2024-03-15 NOTE — Telephone Encounter (Signed)
 Okay for refill?

## 2024-03-30 ENCOUNTER — Other Ambulatory Visit: Payer: Self-pay | Admitting: Adult Health

## 2024-04-03 NOTE — Telephone Encounter (Signed)
**Note De-identified  [place] Obfuscation** Please advise 

## 2024-04-04 ENCOUNTER — Other Ambulatory Visit (HOSPITAL_COMMUNITY): Payer: Self-pay

## 2024-04-05 ENCOUNTER — Other Ambulatory Visit (HOSPITAL_COMMUNITY): Payer: Self-pay

## 2024-04-30 ENCOUNTER — Encounter: Payer: Self-pay | Admitting: Adult Health

## 2024-05-10 DIAGNOSIS — H2512 Age-related nuclear cataract, left eye: Secondary | ICD-10-CM | POA: Diagnosis not present

## 2024-05-10 DIAGNOSIS — H18413 Arcus senilis, bilateral: Secondary | ICD-10-CM | POA: Diagnosis not present

## 2024-05-10 DIAGNOSIS — H25043 Posterior subcapsular polar age-related cataract, bilateral: Secondary | ICD-10-CM | POA: Diagnosis not present

## 2024-05-10 DIAGNOSIS — H2513 Age-related nuclear cataract, bilateral: Secondary | ICD-10-CM | POA: Diagnosis not present

## 2024-05-10 DIAGNOSIS — H35373 Puckering of macula, bilateral: Secondary | ICD-10-CM | POA: Diagnosis not present

## 2024-06-13 ENCOUNTER — Other Ambulatory Visit: Payer: Self-pay | Admitting: Adult Health

## 2024-06-13 DIAGNOSIS — F419 Anxiety disorder, unspecified: Secondary | ICD-10-CM

## 2024-06-17 ENCOUNTER — Other Ambulatory Visit: Payer: Self-pay | Admitting: Adult Health

## 2024-06-17 DIAGNOSIS — R7303 Prediabetes: Secondary | ICD-10-CM

## 2024-06-17 DIAGNOSIS — Z713 Dietary counseling and surveillance: Secondary | ICD-10-CM

## 2024-06-21 ENCOUNTER — Encounter: Payer: Self-pay | Admitting: Adult Health

## 2024-06-21 DIAGNOSIS — Z713 Dietary counseling and surveillance: Secondary | ICD-10-CM

## 2024-06-21 DIAGNOSIS — R7303 Prediabetes: Secondary | ICD-10-CM

## 2024-06-21 MED ORDER — WEGOVY 2.4 MG/0.75ML ~~LOC~~ SOAJ: SUBCUTANEOUS | 0 refills | Status: DC

## 2024-06-21 NOTE — Telephone Encounter (Signed)
 Ok to fill? Not sure when pt need to f/u with A1c. She is due for a CPE soon in Jan. Please advise

## 2024-06-25 ENCOUNTER — Other Ambulatory Visit (HOSPITAL_COMMUNITY): Payer: Self-pay

## 2024-06-25 ENCOUNTER — Telehealth: Payer: Self-pay

## 2024-06-25 ENCOUNTER — Ambulatory Visit: Admission: EM | Admit: 2024-06-25 | Discharge: 2024-06-25 | Disposition: A | Discharge status: Home / Self Care

## 2024-06-25 ENCOUNTER — Encounter: Payer: Self-pay | Admitting: Emergency Medicine

## 2024-06-25 ENCOUNTER — Ambulatory Visit

## 2024-06-25 ENCOUNTER — Other Ambulatory Visit: Payer: Self-pay | Admitting: Adult Health

## 2024-06-25 DIAGNOSIS — J069 Acute upper respiratory infection, unspecified: Secondary | ICD-10-CM

## 2024-06-25 DIAGNOSIS — I10 Essential (primary) hypertension: Secondary | ICD-10-CM

## 2024-06-25 NOTE — ED Triage Notes (Signed)
 Pt c/o nonproductive cough, nasal drainage and slight headache x's 1 week   St's she has been taking OTC meds with some relief then symptoms return

## 2024-06-25 NOTE — ED Provider Notes (Signed)
 [facility]    CSN: [id number] Arrival date & time: 06/25/24  0807      History   Chief Complaint Chief Complaint  Patient presents with   Cough    HPI Ellen [name] is a 68 y.o. female.   Patient presents today due to dry cough, nasal congestion, and headache for the past week.  Patient states that her symptoms are relieved with use of Alka-Seltzer cold plus.  Patient states that she feels like there is mucus in her chest but it just will not come up.  Patient denies fever, chills, nausea, or vomiting.  Patient states that she is eating and drinking without issue.  Patient states that she is able to exercise every day as well without issue.  She states that she has some fullness in her ears as well.  The history is provided by the patient.  Cough   Past Medical History:  Diagnosis Date   Alcohol dependence in sustained full remission 05/28/2021   Generalized anxiety disorder    Hyperlipidemia    Hypertension    Insomnia 03/12/2020   Major depressive disorder    Mild cognitive impairment 06/08/2021   Pre-diabetes    Seasonal allergies    Suicidal overdose 01/20/2021   Tremors of nervous system 03/12/2020    Patient Active Problem List   Diagnosis Date Noted   Major depressive disorder    Pre-diabetes    Seasonal allergies    Mild cognitive impairment 06/08/2021   Alcohol dependence in sustained full remission 05/28/2021   Remote suicidal overdose 01/20/2021   Insomnia 03/12/2020   Hypertension    Hyperlipidemia    Generalized anxiety disorder     Past Surgical History:  Procedure Laterality Date   ABDOMINAL HYSTERECTOMY  1998    OB History   No obstetric history on file.      Home Medications    Prior to Admission medications   Medication Sig Start Date End Date Taking? Authorizing Provider  amoxicillin -clavulanate (AUGMENTIN ) 875-125 MG tablet Take 1 tablet by mouth 2 (two) times daily. 01/20/24   Nafziger, Darleene, NP   benztropine  (COGENTIN ) 1 MG tablet TAKE 1 TABLET BY MOUTH EVERY DAY 03/15/24   Nafziger, Darleene, NP  betamethasone  dipropionate 0.05 % cream APPLY TOPICALLY TWICE A DAY 04/04/24   Nafziger, Darleene, NP  citalopram  (CELEXA ) 10 MG tablet TAKE 1 TABLET BY MOUTH EVERY DAY 06/14/24   Nafziger, Darleene, NP  donepezil  (ARICEPT ) 10 MG tablet Take 1 tablet (10 mg total) by mouth every morning. 02/01/24   Wertman, Sara E, PA-C  ezetimibe  (ZETIA ) 10 MG tablet TAKE 1 TABLET BY MOUTH EVERY DAY 01/25/24   Nafziger, Darleene, NP  Magnesium 250 MG TABS Take 250 mg by mouth daily as needed (cramps).    [provider]  Multiple Vitamin (MULTIVITAMIN WITH MINERALS) TABS tablet Take 1 tablet by mouth every morning. ALIVE    [provider]  naproxen sodium (ALEVE) 220 MG tablet Take 220-440 mg by mouth 2 (two) times daily as needed (pain).    [provider]  ramipril  (ALTACE ) 5 MG capsule Take 1 capsule (5 mg total) by mouth daily. 08/04/23   Nafziger, Darleene, NP  rosuvastatin  (CRESTOR ) 40 MG tablet TAKE 1 TABLET BY MOUTH EVERY DAY 01/25/24   Nafziger, Darleene, NP  semaglutide -weight management (WEGOVY ) 2.4 MG/0.75ML SOAJ SQ injection Inject 0.75 mL (2.4 mg total) under the skin once a week. 06/21/24   Nafziger, Darleene, NP  escitalopram  (LEXAPRO ) 10 MG tablet  TAKE 1 TABLET BY MOUTH EVERY DAY 01/19/21 01/22/21  Georjean Darice HERO, MD    Family History Family History  Problem Relation Age of Onset   High Cholesterol Mother    High blood pressure Mother    Diabetes Mother    High blood pressure Father    High Cholesterol Father    Diabetes Father    Dementia Father    High blood pressure Brother    High Cholesterol Brother    Breast cancer Neg Hx     Social History Social History   Tobacco Use   Smoking status: Former   Smokeless tobacco: Never  Vaping Use   Vaping status: Never Used  Substance Use Topics   Alcohol use: Not Currently    Comment: hx of alcohol abuse; sober since 2019   Drug use: Never      Allergies   Patient has no known allergies.   Review of Systems Review of Systems  Respiratory:  Positive for cough.      Physical Exam Triage Vital Signs ED Triage Vitals  Encounter Vitals Group     BP 06/25/24 0842 (!) 165/83     Girls Systolic BP Percentile --      Girls Diastolic BP Percentile --      Boys Systolic BP Percentile --      Boys Diastolic BP Percentile --      Pulse Rate 06/25/24 0842 65     Resp 06/25/24 0842 16     Temp 06/25/24 0842 98.3 F (36.8 C)     Temp Source 06/25/24 0842 Oral     SpO2 06/25/24 0842 96 %     Weight 06/25/24 0843 142 lb (64.4 kg)     Height 06/25/24 0843 5' 7 (1.702 m)     Head Circumference --      Peak Flow --      Pain Score 06/25/24 0843 0     Pain Loc --      Pain Education --      Exclude from Growth Chart --    No data found.  Updated Vital Signs BP (!) 165/83 (BP Location: Left Arm)   Pulse 65   Temp 98.3 F (36.8 C) (Oral)   Resp 16   Ht 5' 7 (1.702 m)   Wt 142 lb (64.4 kg)   SpO2 96%   BMI 22.24 kg/m   Visual Acuity Right Eye Distance:   Left Eye Distance:   Bilateral Distance:    Right Eye Near:   Left Eye Near:    Bilateral Near:     Physical Exam Vitals and nursing note reviewed.  Constitutional:      General: She is not in acute distress.    Appearance: Normal appearance. She is not ill-appearing, toxic-appearing or diaphoretic.  HENT:     Right Ear: Tympanic membrane, ear canal and external ear normal.     Left Ear: Tympanic membrane, ear canal and external ear normal.     Nose: Congestion (moderately enlarged turbinates) present. No rhinorrhea.     Mouth/Throat:     Mouth: Mucous membranes are moist.     Pharynx: Oropharynx is clear. No oropharyngeal exudate or posterior oropharyngeal erythema.  Eyes:     General: No scleral icterus. Cardiovascular:     Rate and Rhythm: Normal rate and regular rhythm.     Heart sounds: Normal heart sounds.  Pulmonary:     Effort: Pulmonary  effort is normal. No respiratory distress.  Breath sounds: Normal breath sounds. No wheezing or rhonchi.  Skin:    General: Skin is warm.  Neurological:     Mental Status: She is alert and oriented to person, place, and time.  Psychiatric:        Mood and Affect: Mood normal.        Behavior: Behavior normal.      UC Treatments / Results  Labs (all labs ordered are listed, but only abnormal results are displayed) Labs Reviewed - No data to display  EKG   Radiology No results found.  Procedures Procedures (including critical care time)  Medications Ordered in UC Medications - No data to display  Initial Impression / Assessment and Plan / UC Course  I have reviewed the triage vital signs and the nursing notes.  Pertinent labs & imaging results that were available during my care of the patient were reviewed by me and considered in my medical decision making (see chart for details).     Final Clinical Impressions(s) / UC Diagnoses   Final diagnoses:  Viral URI     Discharge Instructions      Symptoms are consistent with the viral illness.  You may add Flonase  for nasal congestion and Mucinex to thin mucus we can cough mucus up and spit out.  Do not believe that you need antibiotics.  If you develop a fever 100.5 or above, nausea, vomiting or inability to handle exercise that is a reason to follow-up.  You been diagnosed with a viral illness today. -Viruses have to run their course and medicines that are prescribed are meant to help with symptoms. - With viruses usually feel poorly from 3 to 7 days with cough being the last symptoms to resolve.  -Cough can linger from days to weeks.  Antibiotics are not effective for viruses. -If your cough lasts more than 2 weeks and you are coughing so hard that you are vomiting or feel like you could pass out we need to follow-up with PCP for further testing and evaluation. -Rest, increase water intake, may use pseudoephedrine  for nasal congestion, Delsym (dextromethorphan) or honey as needed for cough, and ibuprofen and/or Tylenol as directed on packaging for pain and fever. -If you have hypertension you should take Coricidin or other OTC meds approved for people with high blood pressure. -You may use a spoonful of honey every 4-6 hours as needed for throat pain and cough. -Warm tea with honey and lemon are helpful for soothe throat as well.  Chloraseptic and Cepacol make a throat lozenge with numbing medication, can be purchased over-the-counter. -May also use Flonase  or sinus rinse for sinus pressure or nasal congestion.  Be sure to use distilled bottled water for sinus rinses. -May use coolmist humidifier to open up nasal passages -May elevate head to assist with postnasal drainage. -If you feel poorly (fever, fatigue, shortness of breath, nausea, etc.) for more than 10 days to be sure to follow-up with PCP or in clinic for further evaluation and additional treatments. If you experience chest pain with shortness of breath or pulse oxygen less than 95% you should report to the ER.     ED Prescriptions   None    PDMP not reviewed this encounter.   Andra Corean BROCKS, PA-C 06/25/24 [phone]

## 2024-06-25 NOTE — Telephone Encounter (Signed)
 Pharmacy Patient Advocate Encounter   Received notification from Onbase that prior authorization for Wegovy  2.4 is required/requested.   Insurance verification completed.   The patient is insured through [employer].   Per test claim: [employer] has previous Medicare Part D denial, see encounter 02/08/24. Per patients Wegovy  copay card, patient price is $499.00.

## 2024-06-25 NOTE — Discharge Instructions (Signed)
 Symptoms are consistent with the viral illness.  You may add Flonase  for nasal congestion and Mucinex to thin mucus we can cough mucus up and spit out.  Do not believe that you need antibiotics.  If you develop a fever 100.5 or above, nausea, vomiting or inability to handle exercise that is a reason to follow-up.  You been diagnosed with a viral illness today. -Viruses have to run their course and medicines that are prescribed are meant to help with symptoms. - With viruses usually feel poorly from 3 to 7 days with cough being the last symptoms to resolve.  -Cough can linger from days to weeks.  Antibiotics are not effective for viruses. -If your cough lasts more than 2 weeks and you are coughing so hard that you are vomiting or feel like you could pass out we need to follow-up with PCP for further testing and evaluation. -Rest, increase water intake, may use pseudoephedrine for nasal congestion, Delsym (dextromethorphan) or honey as needed for cough, and ibuprofen and/or Tylenol as directed on packaging for pain and fever. -If you have hypertension you should take Coricidin or other OTC meds approved for people with high blood pressure. -You may use a spoonful of honey every 4-6 hours as needed for throat pain and cough. -Warm tea with honey and lemon are helpful for soothe throat as well.  Chloraseptic and Cepacol make a throat lozenge with numbing medication, can be purchased over-the-counter. -May also use Flonase  or sinus rinse for sinus pressure or nasal congestion.  Be sure to use distilled bottled water for sinus rinses. -May use coolmist humidifier to open up nasal passages -May elevate head to assist with postnasal drainage. -If you feel poorly (fever, fatigue, shortness of breath, nausea, etc.) for more than 10 days to be sure to follow-up with PCP or in clinic for further evaluation and additional treatments. If you experience chest pain with shortness of breath or pulse oxygen less than  95% you should report to the ER.

## 2024-06-26 NOTE — Telephone Encounter (Signed)
 Noted

## 2024-06-29 NOTE — Progress Notes (Signed)
   06/29/2024  Patient ID: Ellen [name], female   DOB: Nov 23, 1955, 68 y.o.   MRN: [id number]  Pharmacy Quality Measure Review  This patient is appearing on a report for being at risk of failing the adherence measure for cholesterol (statin) medications this calendar year.   Medication: Rosuvastatin  Last fill date: 06/13/24 for 90 day supply  Insurance report was not up to date. No action needed at this time.   Jon VEAR Lindau, PharmD Clinical Pharmacist [phone]

## 2024-07-23 DIAGNOSIS — H2512 Age-related nuclear cataract, left eye: Secondary | ICD-10-CM | POA: Diagnosis not present

## 2024-07-23 DIAGNOSIS — H5371 Glare sensitivity: Secondary | ICD-10-CM | POA: Diagnosis not present

## 2024-07-24 HISTORY — PX: CATARACT EXTRACTION: SUR2

## 2024-08-01 NOTE — Progress Notes (Unsigned)
 Subjective:    Patient ID: Ellen [name], female    DOB: [birth year], 68 y.o.   MRN: [id number]  HPI Patient presents for yearly preventative medicine examination. She is a pleasant 68 year old female who  has a past medical history of Alcohol dependence in sustained full remission (05/28/2021), Generalized anxiety disorder, Hyperlipidemia, Hypertension, Insomnia (03/12/2020), Major depressive disorder, Mild cognitive impairment (06/08/2021), Pre-diabetes, Seasonal allergies, Suicidal overdose (01/20/2021), and Tremors of nervous system (03/12/2020).  Pre diabetes/Obesity -She is currently on Wegoy 2.4 mg weekly and has been maintaining her weight. She is eating healthy and exercising on a routine basis.  Lab Results  Component Value Date   HGBA1C 5.7 (A) 08/04/2023   HGBA1C 5.6 12/31/2022   HGBA1C 6.4 09/01/2022   HTN -managed with ramipril  5 mg daily and HCTZ 25 mg daily.  She denies symptoms of hypotension. BP Readings from Last 3 Encounters:  08/02/24 110/80  06/25/24 (!) 165/83  02/01/24 120/71   Hyperlipidemia - managed with zetia  10 mg daily and crestor  40 mg daily. She denies myalgia or fatigue  Lab Results  Component Value Date   CHOL 155 09/01/2022   HDL 55.90 09/01/2022   LDLCALC 79 09/01/2022   TRIG 98.0 09/01/2022   CHOLHDL 3 09/01/2022    Anxiety/Depression - managed with Celexa  10 mg daily. She feels well controlled.   Cognitive Disorder -seen by neurology roughly every 6 months.  She had memory loss did quite suddenly after alcohol rehab in 2019.  Her initial MoCA was 12 out of 30.  MRI at that time without acute changes, mild diffuse atrophy.  There has been significant improvement with psychotherapy and donezepil 10 mg daily.  She did have neuropsychological evaluation in October 22 which indicated mild neurocognitive disorder possibly related to alcohol abuse.  Her cognitive status has improved after changing her lifestyle, discontinuing alcohol, joining  group therapy, and becoming a counselor self and increasing her physical activity.      02/01/2024   12:00 PM 03/08/2023   12:00 PM 09/13/2022   10:00 AM  MMSE - Mini Mental State Exam  Orientation to time 5 5 5   Orientation to Place 5 5 5   Registration 3 3 3   Attention/ Calculation 5 5 5   Recall 3 3 3   Language- name 2 objects 2 2 2   Language- repeat 1 1 1   Language- follow 3 step command 3 3 3   Language- read & follow direction 1 1 1   Write a sentence 1 1 1   Copy design 0 1 1  Total score 29 30 30      All immunizations and health maintenance protocols were reviewed with the patient and needed orders were placed. She refuses the shingles vaccian  Appropriate screening laboratory values were ordered for the patient including screening of hyperlipidemia, renal function and hepatic function.  Medication reconciliation,  past medical history, social history, problem list and allergies were reviewed in detail with the patient  Goals were established with regard to weight loss, exercise, and  diet in compliance with medications Wt Readings from Last 3 Encounters:  08/02/24 142 lb (64.4 kg)  06/25/24 142 lb (64.4 kg)  02/01/24 149 lb (67.6 kg)    She is due for colon cancer screening ( she has her cologuard kit at home). She is up to date on mammograms and GYN care   She denies any acute issue and reports that  she feels great!    Review of Systems  Constitutional: Negative.  HENT: Negative.    Eyes: Negative.   Respiratory: Negative.    Cardiovascular: Negative.   Gastrointestinal: Negative.   Endocrine: Negative.   Genitourinary: Negative.   Musculoskeletal: Negative.   Skin: Negative.   Allergic/Immunologic: Negative.   Neurological: Negative.   Hematological: Negative.   Psychiatric/Behavioral: Negative.     Past Medical History:  Diagnosis Date   Alcohol dependence in sustained full remission 05/28/2021   Generalized anxiety disorder    Hyperlipidemia     Hypertension    Insomnia 03/12/2020   Major depressive disorder    Mild cognitive impairment 06/08/2021   Pre-diabetes    Seasonal allergies    Suicidal overdose 01/20/2021   Tremors of nervous system 03/12/2020    Social History   Socioeconomic History   Marital status: Media Planner    Spouse name: Not on file   Number of children: Not on file   Years of education: 14   Highest education level: Associate degree: occupational, scientist, product/process development, or vocational program  Occupational History   Occupation: Substance abuse peer support  Tobacco Use   Smoking status: Former   Smokeless tobacco: Never  Advertising Account Planner   Vaping status: Never Used  Substance and Sexual Activity   Alcohol use: Not Currently    Comment: hx of alcohol abuse; sober since 2019   Drug use: Never   Sexual activity: Not on file  Other Topics Concern   Not on file  Social History Narrative   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, 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 [address]   Married to partner    Right handed   Drinks caffeine   Two story home      Social Drivers of Health   Tobacco Use: Medium Risk (08/02/2024)   Patient History    Smoking Tobacco Use: Former    Smokeless Tobacco Use: Never    Passive Exposure: Not on Actuary Strain: Low Risk (08/01/2024)   Overall Financial Resource Strain (CARDIA)    Difficulty of Paying Living Expenses: Not very hard  Food Insecurity: No Food Insecurity (08/01/2024)   Epic    Worried About Radiation Protection Practitioner of Food in the Last Year: Never true    Ran Out of Food in the Last Year: Never true  Transportation Needs: No Transportation Needs (08/01/2024)   Epic    Lack of Transportation (Medical): No    Lack of Transportation (Non-Medical): No  Physical Activity: Sufficiently Active (08/01/2024)   Exercise Vital Sign    Days of Exercise per Week: 7 days    Minutes of Exercise per Session: 120 min  Stress: No Stress Concern Present (08/01/2024)    [employer] of Occupational Health - Occupational Stress Questionnaire    Feeling of Stress: Only a little  Social Connections: Socially Integrated (08/01/2024)   Social Connection and Isolation Panel    Frequency of Communication with Friends and Family: Twice a week    Frequency of Social Gatherings with Friends and Family: Once a week    Attends Religious Services: More than 4 times per year    Active Member of [employer] or Organizations: Yes    Attends Banker Meetings: Not on file    Marital Status: Living with partner  Intimate Partner Violence: Not At Risk (12/28/2023)   Humiliation, Afraid, Rape, and Kick questionnaire    Fear of Current or Ex-Partner: No    Emotionally Abused: No    Physically Abused: No  Sexually Abused: No  Depression (PHQ2-9): Low Risk (08/02/2024)   Depression (PHQ2-9)    PHQ-2 Score: 0  Alcohol Screen: Low Risk (12/28/2023)   Alcohol Screen    Last Alcohol Screening Score (AUDIT): 0  Housing: Unknown (08/01/2024)   Epic    Unable to Pay for Housing in the Last Year: No    Number of Times Moved in the Last Year: Not on file    Homeless in the Last Year: No  Utilities: Not At Risk (12/28/2023)   [employer] Utilities    Threatened with loss of utilities: No  Health Literacy: Adequate Health Literacy (12/28/2023)   B1300 Health Literacy    Frequency of need for help with medical instructions: Never    Past Surgical History:  Procedure Laterality Date   ABDOMINAL HYSTERECTOMY  1998   CATARACT EXTRACTION Left 07/24/2024    Family History  Problem Relation Age of Onset   High Cholesterol Mother    High blood pressure Mother    Diabetes Mother    High blood pressure Father    High Cholesterol Father    Diabetes Father    Dementia Father    High blood pressure Brother    High Cholesterol Brother    Breast cancer Neg Hx     Allergies[1]  Medications Ordered Prior to Encounter[2]  BP 110/80   Temp 98.3 F (36.8 C) (Oral)   Ht 5'  7 (1.702 m)   Wt 142 lb (64.4 kg)   BMI 22.24 kg/m       Objective:   Physical Exam Vitals and nursing note reviewed.  Constitutional:      General: She is not in acute distress.    Appearance: Normal appearance. She is not ill-appearing.  HENT:     Head: Normocephalic and atraumatic.     Right Ear: Tympanic membrane, ear canal and external ear normal. There is no impacted cerumen.     Left Ear: Tympanic membrane, ear canal and external ear normal. There is no impacted cerumen.     Nose: Nose normal. No congestion or rhinorrhea.     Mouth/Throat:     Mouth: Mucous membranes are moist.     Pharynx: Oropharynx is clear.  Eyes:     Extraocular Movements: Extraocular movements intact.     Conjunctiva/sclera: Conjunctivae normal.     Pupils: Pupils are equal, round, and reactive to light.  Neck:     Vascular: No carotid bruit.  Cardiovascular:     Rate and Rhythm: Normal rate and regular rhythm.     Pulses: Normal pulses.     Heart sounds: No murmur heard.    No friction rub. No gallop.  Pulmonary:     Effort: Pulmonary effort is normal.     Breath sounds: Normal breath sounds.  Abdominal:     General: Abdomen is flat. Bowel sounds are normal. There is no distension.     Palpations: Abdomen is soft. There is no mass.     Tenderness: There is no abdominal tenderness. There is no guarding or rebound.     Hernia: No hernia is present.  Musculoskeletal:        General: Normal range of motion.     Cervical back: Normal range of motion and neck supple.  Lymphadenopathy:     Cervical: No cervical adenopathy.  Skin:    General: Skin is warm and dry.     Capillary Refill: Capillary refill takes less than 2 seconds.  Neurological:  General: No focal deficit present.     Mental Status: She is alert and oriented to person, place, and time.  Psychiatric:        Mood and Affect: Mood normal.        Behavior: Behavior normal.        Thought Content: Thought content normal.         Judgment: Judgment normal.         Assessment & Plan:  1. Routine general medical examination at a health care facility (Primary) Today patient counseled on age appropriate routine health concerns for screening and prevention, each reviewed and up to date or declined. Immunizations reviewed and up to date or declined. Labs ordered and reviewed. Risk factors for depression reviewed and negative. Hearing function and visual acuity are intact. ADLs screened and addressed as needed. Functional ability and level of safety reviewed and appropriate. Education, counseling and referrals performed based on assessed risks today. Patient provided with a copy of personalized plan for preventive services. - Advised to send in cologuard kit  - Follow up in one year or sooner if needed   2. Pre-diabetes - Continue with Wegovy  - Lipid panel; Future - CBC; Future - Comprehensive metabolic panel with GFR; Future - Hemoglobin A1c; Future  3. Essential hypertension - well controlled. No change in medication  - Lipid panel; Future - CBC; Future - Comprehensive metabolic panel with GFR; Future  4. Mixed hyperlipidemia - Controlled. Continue with Crestor   - rosuvastatin  (CRESTOR ) 40 MG tablet; Take 1 tablet (40 mg total) by mouth daily.  Dispense: 90 tablet; Refill: 3 - Lipid panel; Future - CBC; Future - Comprehensive metabolic panel with GFR; Future  5. Generalized anxiety disorder - Well controlled. No change in medication   6. Recurrent major depressive disorder, in full remission - Well controlled. Continue with Celexa   7. Mild cognitive impairment - She has her appointment with Neurology tomorrow. No cognitive issues noted  8. Screening-pulmonary TB - She needs screening for her job - [employer]-TB Gold Plus; Future  Darleene Shape, NP     [1] No Known Allergies [2]  Current Outpatient Medications on File Prior to Visit  Medication Sig Dispense Refill   benztropine  (COGENTIN ) 1  MG tablet TAKE 1 TABLET BY MOUTH EVERY DAY 90 tablet 1   BESIVANCE 0.6 % SUSP Place 1 drop into the right eye 4 (four) times daily.     betamethasone  dipropionate 0.05 % cream APPLY TOPICALLY TWICE A DAY 30 g 1   Bromfenac Sodium 0.07 % SOLN Apply 1 drop to eye 2 (two) times daily.     citalopram  (CELEXA ) 10 MG tablet TAKE 1 TABLET BY MOUTH EVERY DAY 90 tablet 1   Difluprednate 0.05 % EMUL Apply 1 drop to eye 4 (four) times daily.     donepezil  (ARICEPT ) 10 MG tablet Take 1 tablet (10 mg total) by mouth every morning. 90 tablet 3   ezetimibe  (ZETIA ) 10 MG tablet TAKE 1 TABLET BY MOUTH EVERY DAY 90 tablet 1   Magnesium 250 MG TABS Take 250 mg by mouth daily as needed (cramps).     Multiple Vitamin (MULTIVITAMIN WITH MINERALS) TABS tablet Take 1 tablet by mouth every morning. ALIVE     naproxen sodium (ALEVE) 220 MG tablet Take 220-440 mg by mouth 2 (two) times daily as needed (pain).     ramipril  (ALTACE ) 5 MG capsule TAKE 1 CAPSULE BY MOUTH EVERY DAY 90 capsule 3   rosuvastatin  (CRESTOR ) 40 MG tablet  TAKE 1 TABLET BY MOUTH EVERY DAY 90 tablet 1   semaglutide -weight management (WEGOVY ) 2.4 MG/0.75ML SOAJ SQ injection Inject 0.75 mL (2.4 mg total) under the skin once a week. 9 mL 0   [DISCONTINUED] escitalopram  (LEXAPRO ) 10 MG tablet TAKE 1 TABLET BY MOUTH EVERY DAY 90 tablet 0   No current facility-administered medications on file prior to visit.

## 2024-08-02 ENCOUNTER — Encounter: Payer: Self-pay | Admitting: Adult Health

## 2024-08-02 ENCOUNTER — Ambulatory Visit (INDEPENDENT_AMBULATORY_CARE_PROVIDER_SITE_OTHER): Admitting: Adult Health

## 2024-08-02 VITALS — BP 110/80 | Temp 98.3°F | Ht 67.0 in | Wt 142.0 lb

## 2024-08-02 DIAGNOSIS — R7303 Prediabetes: Secondary | ICD-10-CM | POA: Diagnosis not present

## 2024-08-02 DIAGNOSIS — E782 Mixed hyperlipidemia: Secondary | ICD-10-CM | POA: Diagnosis not present

## 2024-08-02 DIAGNOSIS — F411 Generalized anxiety disorder: Secondary | ICD-10-CM

## 2024-08-02 DIAGNOSIS — G3184 Mild cognitive impairment, so stated: Secondary | ICD-10-CM

## 2024-08-02 DIAGNOSIS — I10 Essential (primary) hypertension: Secondary | ICD-10-CM | POA: Diagnosis not present

## 2024-08-02 DIAGNOSIS — Z0000 Encounter for general adult medical examination without abnormal findings: Secondary | ICD-10-CM | POA: Diagnosis not present

## 2024-08-02 DIAGNOSIS — Z23 Encounter for immunization: Secondary | ICD-10-CM

## 2024-08-02 DIAGNOSIS — F3342 Major depressive disorder, recurrent, in full remission: Secondary | ICD-10-CM

## 2024-08-02 DIAGNOSIS — Z111 Encounter for screening for respiratory tuberculosis: Secondary | ICD-10-CM

## 2024-08-02 LAB — HEMOGLOBIN A1C: Hgb A1c MFr Bld: 6.2 % (ref 4.6–6.5)

## 2024-08-02 LAB — LIPID PANEL
Cholesterol: 132 mg/dL (ref 28–200)
HDL: 54.1 mg/dL (ref >39.00)
LDL Cholesterol: 62 mg/dL (ref 10–99)
NonHDL: 78.09
Total CHOL/HDL Ratio: 2
Triglycerides: 81 mg/dL (ref 10.0–149.0)
VLDL: 16.2 mg/dL (ref 0.0–40.0)

## 2024-08-02 LAB — COMPREHENSIVE METABOLIC PANEL WITH GFR
ALT: 57 U/L — ABNORMAL HIGH (ref 3–35)
AST: 46 U/L — ABNORMAL HIGH (ref 5–37)
Albumin: 4.3 g/dL (ref 3.5–5.2)
Alkaline Phosphatase: 86 U/L (ref 39–117)
BUN: 18 mg/dL (ref 6–23)
CO2: 28 meq/L (ref 19–32)
Calcium: 9.6 mg/dL (ref 8.4–10.5)
Chloride: 102 meq/L (ref 96–112)
Creatinine, Ser: 0.93 mg/dL (ref 0.40–1.20)
GFR: 63.08 mL/min (ref >60.00)
Glucose, Bld: 77 mg/dL (ref 70–99)
Potassium: 4.2 meq/L (ref 3.5–5.1)
Sodium: 140 meq/L (ref 135–145)
Total Bilirubin: 0.4 mg/dL (ref 0.2–1.2)
Total Protein: 7.1 g/dL (ref 6.0–8.3)

## 2024-08-02 LAB — CBC
HCT: 39.7 % (ref 36.0–46.0)
Hemoglobin: 13.1 g/dL (ref 12.0–15.0)
MCHC: 33 g/dL (ref 30.0–36.0)
MCV: 95.6 fl (ref 78.0–100.0)
Platelets: 158 K/uL (ref 150.0–400.0)
RBC: 4.15 Mil/uL (ref 3.87–5.11)
RDW: 12.8 % (ref 11.5–15.5)
WBC: 4.4 K/uL (ref 4.0–10.5)

## 2024-08-02 MED ORDER — ROSUVASTATIN CALCIUM 40 MG PO TABS: 40.0000 mg | ORAL_TABLET | Freq: Every day | ORAL | 3 refills | Status: AC
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
## 2024-08-02 NOTE — Patient Instructions (Signed)
 It was great seeing you today   We will follow up with you regarding your lab work   Please let me know if you need anything

## 2024-08-03 ENCOUNTER — Encounter: Payer: Self-pay | Admitting: Physician Assistant

## 2024-08-03 ENCOUNTER — Ambulatory Visit: Payer: Self-pay | Admitting: Adult Health

## 2024-08-03 ENCOUNTER — Ambulatory Visit: Admitting: Physician Assistant

## 2024-08-03 VITALS — BP 118/74 | HR 69 | Resp 20 | Ht 67.0 in

## 2024-08-03 DIAGNOSIS — G3184 Mild cognitive impairment, so stated: Secondary | ICD-10-CM

## 2024-08-03 DIAGNOSIS — R413 Other amnesia: Secondary | ICD-10-CM

## 2024-08-03 DIAGNOSIS — R748 Abnormal levels of other serum enzymes: Secondary | ICD-10-CM

## 2024-08-03 NOTE — Patient Instructions (Signed)
"  ° °  Continue Donepezil  10 mg daily  Repeat neuropsychological evaluation  in about months for diagnostic clarity and disease trajectory     Control mood as per PCP and continue group therapy   3Follow-up in 6 months, call for any changes    RECOMMENDATIONS FOR ALL PATIENTS WITH MEMORY PROBLEMS: 1. Continue to exercise (Recommend 30 minutes of walking everyday, or 3 hours every week) 2. Increase social interactions - continue going to [place] and enjoy social gatherings with friends and family 3. Eat healthy, avoid fried foods and eat more fruits and vegetables 4. Maintain adequate blood pressure, blood sugar, and blood cholesterol level. Reducing the risk of stroke and cardiovascular disease also helps promoting better memory. 5. Avoid stressful situations. Live a simple life and avoid aggravations. Organize your time and prepare for the next day in anticipation. 6. Sleep well, avoid any interruptions of sleep and avoid any distractions in the bedroom that may interfere with adequate sleep quality 7. Avoid sugar, avoid sweets as there is a strong link between excessive sugar intake, diabetes, and cognitive impairment The Mediterranean diet has been shown to help patients reduce the risk of progressive memory disorders and reduces cardiovascular risk. This includes eating fish, eat fruits and green leafy vegetables, nuts like almonds and hazelnuts, walnuts, and also use olive oil. Avoid fast foods and fried foods as much as possible. Avoid sweets and sugar as sugar use has been linked to worsening of memory function. "

## 2024-08-05 LAB — QUANTIFERON-TB GOLD PLUS
Mitogen-NIL: 7.21 [IU]/mL
NIL: 0.02 [IU]/mL
QuantiFERON-TB Gold Plus: NEGATIVE
TB1-NIL: 0.05 [IU]/mL
TB2-NIL: 0.03 [IU]/mL

## 2024-08-26 ENCOUNTER — Encounter: Payer: Self-pay | Admitting: Adult Health

## 2024-09-07 ENCOUNTER — Ambulatory Visit: Payer: Self-pay

## 2024-09-07 ENCOUNTER — Institutional Professional Consult (permissible substitution): Payer: Self-pay | Admitting: Psychology

## 2024-09-14 DIAGNOSIS — Z006 Encounter for examination for normal comparison and control in clinical research program: Secondary | ICD-10-CM

## 2024-09-14 NOTE — Research (Signed)
 [employer] 2021-05 - Specimen Collection Study to Evaluate Biomarkers in Subjects with Cancer  (Non-Cancer Cohort)  Patient Ellen [name] was identified by Dr. Gudena as a potential candidate for the above listed study as a Non-Cancer Cohort Participant.  This Clinical Research Coordinator met with Pahola Dimmitt, [id number], on 09/14/24 in a manner and location that ensures patient privacy to discuss participation in the above listed research study.  Patient is Accompanied by her partner [name].  A copy of the informed consent document with embedded HIPAA language was provided to the patient.  Patient reads, speaks, and understands English.    Patient was provided with the business card of this Coordinator and encouraged to contact the research team with any questions.  Approximately 20 minutes were spent with the patient reviewing the informed consent documents.  Patient was provided the option of taking informed consent documents home to review and was encouraged to review at their convenience with their support network, including other care providers. Patient is comfortable with making a decision regarding study participation today.  Following eligibility review, pt was found not eligible for this study due to a history of squamous cell carcinoma in 2022. Pt. was thanked for their time and was reminded that she may contact the research team at any time with questions or concerns.       Abelardo Jock Clinical Research Coordinator [phone] 09/14/2024 9:32 AM

## 2024-09-16 ENCOUNTER — Other Ambulatory Visit: Payer: Self-pay | Admitting: Adult Health

## 2024-09-16 DIAGNOSIS — R7303 Prediabetes: Secondary | ICD-10-CM

## 2024-09-16 DIAGNOSIS — Z713 Dietary counseling and surveillance: Secondary | ICD-10-CM

## 2024-09-19 ENCOUNTER — Encounter: Payer: Self-pay | Admitting: Psychology

## 2025-01-02 ENCOUNTER — Ambulatory Visit

## 2025-02-01 ENCOUNTER — Ambulatory Visit: Payer: Self-pay | Admitting: Physician Assistant
